# Patient Record
Sex: Female | Born: 1940
Health system: Southern US, Community
[De-identification: ages and names within clinical notes are randomized; demographics above are authoritative.]

## PROBLEM LIST (undated history)

## (undated) DIAGNOSIS — I471 Supraventricular tachycardia, unspecified: Secondary | ICD-10-CM

## (undated) DIAGNOSIS — D649 Anemia, unspecified: Secondary | ICD-10-CM

## (undated) DIAGNOSIS — Z803 Family history of malignant neoplasm of breast: Secondary | ICD-10-CM

## (undated) DIAGNOSIS — I4719 Other supraventricular tachycardia: Secondary | ICD-10-CM

## (undated) DIAGNOSIS — I341 Nonrheumatic mitral (valve) prolapse: Secondary | ICD-10-CM

## (undated) DIAGNOSIS — I00 Rheumatic fever without heart involvement: Secondary | ICD-10-CM

## (undated) DIAGNOSIS — E785 Hyperlipidemia, unspecified: Secondary | ICD-10-CM

## (undated) DIAGNOSIS — A498 Other bacterial infections of unspecified site: Secondary | ICD-10-CM

## (undated) HISTORY — DX: Family history of malignant neoplasm of breast: Z80.3

## (undated) HISTORY — PX: ABLATION: SHX5711

## (undated) HISTORY — DX: Hyperlipidemia, unspecified: E78.5

## (undated) HISTORY — DX: Nonrheumatic mitral (valve) prolapse: I34.1

## (undated) HISTORY — DX: Other supraventricular tachycardia: I47.19

## (undated) HISTORY — DX: Supraventricular tachycardia, unspecified: I47.10

## (undated) HISTORY — DX: Other bacterial infections of unspecified site: A49.8

## (undated) HISTORY — DX: Anemia, unspecified: D64.9

## (undated) HISTORY — DX: Supraventricular tachycardia: I47.1

## (undated) HISTORY — DX: Rheumatic fever without heart involvement: I00

---
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
## 1995-12-07 DIAGNOSIS — A498 Other bacterial infections of unspecified site: Secondary | ICD-10-CM

## 1995-12-07 HISTORY — PX: BREAST BIOPSY: SHX20

## 1995-12-07 HISTORY — DX: Other bacterial infections of unspecified site: A49.8

## 2002-10-26 ENCOUNTER — Ambulatory Visit (HOSPITAL_COMMUNITY): Admission: RE | Admit: 2002-10-26 | Discharge: 2002-10-26 | Payer: Self-pay | Admitting: *Deleted

## 2005-06-23 ENCOUNTER — Ambulatory Visit: Payer: Self-pay | Admitting: Internal Medicine

## 2006-02-08 ENCOUNTER — Ambulatory Visit: Payer: Self-pay | Admitting: Internal Medicine

## 2006-07-07 ENCOUNTER — Encounter: Payer: Self-pay | Admitting: Internal Medicine

## 2006-07-11 ENCOUNTER — Ambulatory Visit: Payer: Self-pay | Admitting: Internal Medicine

## 2006-09-28 ENCOUNTER — Ambulatory Visit: Payer: Self-pay | Admitting: Ophthalmology

## 2008-01-29 ENCOUNTER — Encounter: Payer: Self-pay | Admitting: Internal Medicine

## 2008-09-19 ENCOUNTER — Encounter: Payer: Self-pay | Admitting: Internal Medicine

## 2009-01-07 ENCOUNTER — Encounter: Payer: Self-pay | Admitting: Internal Medicine

## 2009-04-08 ENCOUNTER — Encounter: Payer: Self-pay | Admitting: Internal Medicine

## 2009-05-08 ENCOUNTER — Encounter: Payer: Self-pay | Admitting: Internal Medicine

## 2009-07-11 ENCOUNTER — Encounter: Payer: Self-pay | Admitting: Internal Medicine

## 2009-10-10 ENCOUNTER — Encounter: Payer: Self-pay | Admitting: Internal Medicine

## 2009-10-17 ENCOUNTER — Encounter: Payer: Self-pay | Admitting: Internal Medicine

## 2009-11-11 ENCOUNTER — Encounter: Payer: Self-pay | Admitting: Internal Medicine

## 2010-01-29 DIAGNOSIS — I471 Supraventricular tachycardia, unspecified: Secondary | ICD-10-CM | POA: Insufficient documentation

## 2010-01-30 ENCOUNTER — Ambulatory Visit: Payer: Self-pay | Admitting: Internal Medicine

## 2010-01-30 DIAGNOSIS — I471 Supraventricular tachycardia: Secondary | ICD-10-CM

## 2010-02-01 ENCOUNTER — Emergency Department: Payer: Self-pay | Admitting: Emergency Medicine

## 2010-12-27 ENCOUNTER — Encounter: Payer: Self-pay | Admitting: Interventional Cardiology

## 2011-01-05 NOTE — Consult Note (Signed)
Summary: [facility]   Imported By: Marylou Mccoy 03/20/2010 15:24:14  _____________________________________________________________________  External Attachment:    Type:   Image     Comment:   External Document

## 2011-01-05 NOTE — Consult Note (Signed)
Summary: [facility]  [facility]   Imported By: Marylou Mccoy 03/20/2010 15:05:42  _____________________________________________________________________  External Attachment:    Type:   Image     Comment:   External Document

## 2011-01-05 NOTE — Assessment & Plan Note (Signed)
Summary: nep/tachy cardia   Visit Type:  Initial Consult Primary Provider:  Dale [place], MD   History of Present Illness: The patient is referred by Dr. Katrinka Blazing for evaluation of SVT.  The patient is a pleasant 70 yo woman who was told back in 1968 that her heart was "different" and that her electrical activation of her heart was coming from its center but that this was normal.  She has had multiple evaluations since then by several physicians but had a monitor obtained under Dr. Katrinka Blazing because of increasing palpitations.  This demonstrated PJRT with clear termination with a PVC followed by near immediate re-initiation.  The patient has never had syncope and does not feel CHF symptoms.  Her LV function has been reported to be normal.  She denies peripheral edema.  No other complaints except that her husband has undergone a set back having been treated for metastatic salivary gland tumor and recently developed a Staph infection.    Current Medications (verified): 1)  Crestor 5 Mg Tabs (Rosuvastatin Calcium) .... Take One-Half Tablet By Mouth Daily. 2)  Metoprolol Succinate 25 Mg Xr24h-Tab (Metoprolol Succinate) .... Take One Tablet By Mouth Daily 3)  Multivitamins   Tabs (Multiple Vitamin) .... Take One Tablet By Mouth Once Daily. 4)  Calcium 600 Mg Tabs (Calcium) .... Take One Tablet By Mouth Once Daily. 5)  Vitamin D3 2000 Unit Caps (Cholecalciferol) .... Once Daily 6)  Avelox 400 Mg Tabs (Moxifloxacin Hcl) .... Once Daily  Allergies (verified): 1)  ! * Antihistamines 2)  ! Codeine 3)  ! Simvastatin  Past History:  Past Medical History: Last updated: 01/29/2010 Current Problems:  ATRIAL TACHYCARDIA (ICD-427.89)    Family History: Last updated: 01/30/2010 Mother lived to 7.  Social History: Last updated: 01/30/2010 Married No ETOH or tobacco  Past Surgical History: Breast biopsy 1997  Family History: Mother lived to 19.  Social History: Married No ETOH or  tobacco  Review of Systems       All systems reviewed and negative except as noted in the HPI.  Vital Signs:  Patient profile:   70 year old female Height:      64 inches Weight:      114 pounds BMI:     19.64 Pulse rate:   108 / minute BP sitting:   100 / 64  (left arm)  Vitals Entered By: Laurance Flatten CMA (January 30, 2010 11:44 AM)  Physical Exam  General:  Well developed, well nourished, in no acute distress.  HEENT: normal Neck: supple. No JVD. Carotids 2+ bilaterally no bruits Cor: Regular tachycardia, with  no rubs, gallops or murmur Lungs: CTA Ab: soft, nontender. nondistended. No HSM. Good bowel sounds Ext: warm. no cyanosis, clubbing or edema Neuro: alert and oriented. Grossly nonfocal. affect pleasant    EKG  Procedure date:  01/30/2010  Findings:      PJRT at 110/min.  Impression & Recommendations:  Problem # 1:  PSVT (ICD-427.0) The patient appears to have PJRT (Permanent form of junctional reciprocating tachycardia) and it sounds like she has had this for many years.  Something has changed and she appears to be more bothered by it than previously.  I spent one hour today discussing the problem and its treatment.  The risks/benefits/goals/expectations of ablation therapy have been discussed with the patient and she is considering her treatment options.  She will continue her current meds and call if she would like to schedule an ablation. Her updated medication list for this  problem includes:    Metoprolol Succinate 25 Mg Xr24h-tab (Metoprolol succinate) .Marland Kitchen... Take one tablet by mouth daily

## 2011-01-05 NOTE — Consult Note (Signed)
Summary: [facility]   Imported By: Marylou Mccoy 03/20/2010 15:21:36  _____________________________________________________________________  External Attachment:    Type:   Image     Comment:   External Document

## 2011-01-05 NOTE — Consult Note (Signed)
Summary: Jody Howard   Imported By: Marylou Mccoy 12/16/2009 13:12:34  _____________________________________________________________________  External Attachment:    Type:   Image     Comment:   External Document

## 2011-01-05 NOTE — Consult Note (Signed)
Summary: [facility]   Imported By: Marylou Mccoy 03/20/2010 15:23:01  _____________________________________________________________________  External Attachment:    Type:   Image     Comment:   External Document

## 2011-01-08 NOTE — Consult Note (Signed)
Summary: [facility]   Imported By: Marylou Mccoy 03/20/2010 16:08:36  _____________________________________________________________________  External Attachment:    Type:   Image     Comment:   External Document

## 2011-04-08 LAB — HEPATIC FUNCTION PANEL
ALT: 14 U/L (ref 7–35)
AST: 23 U/L (ref 13–35)

## 2011-04-08 LAB — CBC AND DIFFERENTIAL
HCT: 39 % (ref 36–46)
Hemoglobin: 13 g/dL (ref 12.0–16.0)

## 2011-06-03 ENCOUNTER — Ambulatory Visit: Payer: Self-pay | Admitting: Internal Medicine

## 2011-08-03 LAB — LIPID PANEL
Cholesterol: 155 mg/dL (ref 0–200)
HDL: 52 mg/dL (ref 35–70)

## 2012-01-06 DIAGNOSIS — F29 Unspecified psychosis not due to a substance or known physiological condition: Secondary | ICD-10-CM | POA: Diagnosis not present

## 2012-01-07 DIAGNOSIS — F22 Delusional disorders: Secondary | ICD-10-CM | POA: Diagnosis not present

## 2012-01-07 DIAGNOSIS — I499 Cardiac arrhythmia, unspecified: Secondary | ICD-10-CM | POA: Diagnosis not present

## 2012-01-07 DIAGNOSIS — F29 Unspecified psychosis not due to a substance or known physiological condition: Secondary | ICD-10-CM | POA: Diagnosis not present

## 2012-01-07 DIAGNOSIS — E785 Hyperlipidemia, unspecified: Secondary | ICD-10-CM | POA: Diagnosis not present

## 2012-01-07 DIAGNOSIS — I471 Supraventricular tachycardia: Secondary | ICD-10-CM | POA: Diagnosis not present

## 2012-05-10 DIAGNOSIS — D239 Other benign neoplasm of skin, unspecified: Secondary | ICD-10-CM | POA: Diagnosis not present

## 2012-05-10 DIAGNOSIS — L821 Other seborrheic keratosis: Secondary | ICD-10-CM | POA: Diagnosis not present

## 2012-05-10 DIAGNOSIS — D485 Neoplasm of uncertain behavior of skin: Secondary | ICD-10-CM | POA: Diagnosis not present

## 2012-05-10 DIAGNOSIS — L819 Disorder of pigmentation, unspecified: Secondary | ICD-10-CM | POA: Diagnosis not present

## 2012-08-13 DIAGNOSIS — Z23 Encounter for immunization: Secondary | ICD-10-CM | POA: Diagnosis not present

## 2012-10-24 ENCOUNTER — Telehealth: Payer: Self-pay | Admitting: Internal Medicine

## 2012-10-24 DIAGNOSIS — Z139 Encounter for screening, unspecified: Secondary | ICD-10-CM

## 2012-10-24 NOTE — Telephone Encounter (Signed)
I placed order for mammo.

## 2012-10-24 NOTE — Telephone Encounter (Signed)
Pt called stating it is time for her mammogram and she needs order Pt perfers dr Rennis Petty mammogram  [address] in [place]

## 2012-11-15 DIAGNOSIS — Z1231 Encounter for screening mammogram for malignant neoplasm of breast: Secondary | ICD-10-CM | POA: Diagnosis not present

## 2012-11-24 DIAGNOSIS — H6120 Impacted cerumen, unspecified ear: Secondary | ICD-10-CM | POA: Diagnosis not present

## 2012-11-24 DIAGNOSIS — M542 Cervicalgia: Secondary | ICD-10-CM | POA: Diagnosis not present

## 2012-11-24 DIAGNOSIS — H60509 Unspecified acute noninfective otitis externa, unspecified ear: Secondary | ICD-10-CM | POA: Diagnosis not present

## 2012-12-08 DIAGNOSIS — L821 Other seborrheic keratosis: Secondary | ICD-10-CM | POA: Diagnosis not present

## 2012-12-08 DIAGNOSIS — D1739 Benign lipomatous neoplasm of skin and subcutaneous tissue of other sites: Secondary | ICD-10-CM | POA: Diagnosis not present

## 2012-12-08 DIAGNOSIS — D1801 Hemangioma of skin and subcutaneous tissue: Secondary | ICD-10-CM | POA: Diagnosis not present

## 2012-12-08 DIAGNOSIS — D239 Other benign neoplasm of skin, unspecified: Secondary | ICD-10-CM | POA: Diagnosis not present

## 2012-12-08 DIAGNOSIS — D485 Neoplasm of uncertain behavior of skin: Secondary | ICD-10-CM | POA: Diagnosis not present

## 2013-01-25 ENCOUNTER — Encounter: Payer: Self-pay | Admitting: *Deleted

## 2013-01-29 ENCOUNTER — Encounter: Payer: Self-pay | Admitting: Internal Medicine

## 2013-01-29 ENCOUNTER — Ambulatory Visit (INDEPENDENT_AMBULATORY_CARE_PROVIDER_SITE_OTHER): Payer: Medicare Other | Admitting: Internal Medicine

## 2013-01-29 VITALS — BP 110/58 | HR 64 | Temp 98.0°F | Ht 64.0 in | Wt 112.8 lb

## 2013-01-29 DIAGNOSIS — E7800 Pure hypercholesterolemia, unspecified: Secondary | ICD-10-CM | POA: Diagnosis not present

## 2013-01-29 DIAGNOSIS — J479 Bronchiectasis, uncomplicated: Secondary | ICD-10-CM

## 2013-01-29 DIAGNOSIS — I498 Other specified cardiac arrhythmias: Secondary | ICD-10-CM

## 2013-01-31 ENCOUNTER — Other Ambulatory Visit (INDEPENDENT_AMBULATORY_CARE_PROVIDER_SITE_OTHER): Payer: Medicare Other

## 2013-01-31 ENCOUNTER — Encounter: Payer: Self-pay | Admitting: *Deleted

## 2013-01-31 DIAGNOSIS — E7800 Pure hypercholesterolemia, unspecified: Secondary | ICD-10-CM

## 2013-01-31 LAB — CBC WITH DIFFERENTIAL/PLATELET
Basophils Relative: 0.3 % (ref 0.0–3.0)
Eosinophils Absolute: 0.2 10*3/uL (ref 0.0–0.7)
Eosinophils Relative: 5.1 % — ABNORMAL HIGH (ref 0.0–5.0)
HCT: 36.8 % (ref 36.0–46.0)
Hemoglobin: 12.4 g/dL (ref 12.0–15.0)
Lymphs Abs: 1.1 10*3/uL (ref 0.7–4.0)
MCHC: 33.7 g/dL (ref 30.0–36.0)
MCV: 91.8 fl (ref 78.0–100.0)
Monocytes Absolute: 0.4 10*3/uL (ref 0.1–1.0)
Neutro Abs: 2.8 10*3/uL (ref 1.4–7.7)
Neutrophils Relative %: 61.1 % (ref 43.0–77.0)
RBC: 4.02 Mil/uL (ref 3.87–5.11)

## 2013-01-31 LAB — TSH: TSH: 1.06 u[IU]/mL (ref 0.35–5.50)

## 2013-01-31 LAB — LIPID PANEL
Cholesterol: 174 mg/dL (ref 0–200)
LDL Cholesterol: 100 mg/dL — ABNORMAL HIGH (ref 0–99)
Triglycerides: 61 mg/dL (ref 0.0–149.0)
VLDL: 12.2 mg/dL (ref 0.0–40.0)

## 2013-01-31 LAB — COMPREHENSIVE METABOLIC PANEL
AST: 22 U/L (ref 0–37)
Alkaline Phosphatase: 49 U/L (ref 39–117)
BUN: 18 mg/dL (ref 6–23)
Creatinine, Ser: 0.9 mg/dL (ref 0.4–1.2)
Glucose, Bld: 78 mg/dL (ref 70–99)
Total Bilirubin: 0.7 mg/dL (ref 0.3–1.2)

## 2013-02-02 ENCOUNTER — Encounter: Payer: Self-pay | Admitting: Internal Medicine

## 2013-02-02 ENCOUNTER — Other Ambulatory Visit: Payer: Medicare Other

## 2013-02-11 ENCOUNTER — Encounter: Payer: Self-pay | Admitting: Internal Medicine

## 2013-02-11 DIAGNOSIS — E7800 Pure hypercholesterolemia, unspecified: Secondary | ICD-10-CM | POA: Insufficient documentation

## 2013-02-11 DIAGNOSIS — J479 Bronchiectasis, uncomplicated: Secondary | ICD-10-CM | POA: Insufficient documentation

## 2013-02-11 NOTE — Assessment & Plan Note (Signed)
S/p ablation and has done well.  Asymptomatic.  Follow.  

## 2013-02-11 NOTE — Assessment & Plan Note (Signed)
On crestor.  Check lipid panel and liver function.  

## 2013-02-11 NOTE — Progress Notes (Signed)
  Subjective:    Patient ID: Jody Howard, female    DOB: May 15, 1941, 72 y.o.   MRN: [id number]  HPI 72 year old female with past history of MVP with moderate MR and TR, atrial tachycardia s/p ablation and hypercholesterolemia who comes in today for a scheduled follow up and to transfer her care to [facility].  She states she is doing well.  No increased heart rate or palpitations.  No chest pain or tightness.  Stays active.  No increased sob.  Breathing stable.  No nausea or vomiting.  Weight stable. Bowels stable.     Past Medical History  Diagnosis Date  . Mitral valve prolapse   . Rheumatic fever   . Clostridium difficile infection 1997    required hospitalization  . Anemia   . Hyperlipidemia   . Atrial tachycardia     s/p ablation    Current Outpatient Prescriptions on File Prior to Visit  Medication Sig Dispense Refill  . alendronate (FOSAMAX) 70 MG tablet Take 70 mg by mouth every 7 (seven) days. Take with a full glass of water on an empty stomach.      . Calcium Carbonate-Vit D-Min (CALCIUM 600 + MINERALS PO) Take by mouth.      . Cholecalciferol (VITAMIN D-3 PO) Take by mouth.      . Multiple Vitamins-Minerals (MULTIVITAMIN PO) Take by mouth.      . rosuvastatin (CRESTOR) 5 MG tablet Take 5 mg by mouth daily. Take 1/2 tab daily       No current facility-administered medications on file prior to visit.    Review of Systems Patient denies any headache, lightheadedness or dizziness.  No sinus or allergy symptoms.  No chest pain, tightness or palpitations.  No increased shortness of breath, cough or congestion.  No nausea or vomiting.  No acid reflux.  No abdominal pain or cramping.  No bowel change, such as diarrhea, constipation, BRBPR or melana.  No urine change.        Objective:   Physical Exam Filed Vitals:   01/29/13 1546  BP: 110/58  Pulse: 64  Temp: 98 F (74.66 C)   72 year old female in no acute distress.   HEENT:  Nares- clear.  Oropharynx - without  lesions. NECK:  Supple.  Nontender.  No audible bruit.  HEART:  Appears to be regular. LUNGS:  No crackles or wheezing audible.  Respirations even and unlabored.  RADIAL PULSE:  Equal bilaterally.  ABDOMEN:  Soft, nontender.  Bowel sounds present and normal.  No audible abdominal bruit.    EXTREMITIES:  No increased edema present.  DP pulses palpable and equal bilaterally.          Assessment & Plan:  PREVIOUS WEIGHT LOSS.  Weight stable.  Eating well.    HEALTH MAINTENANCE.  Physical 06/04/11.  Colonoscopy 01/10/09 normal.  Sees Dr Ray Church.  Mammogram 11/15/12 - BiRADS I.

## 2013-02-11 NOTE — Assessment & Plan Note (Signed)
Mild bronchiectasis.  Sees Dr Kussin.  Currently doing well.  Follow.   

## 2013-03-15 ENCOUNTER — Encounter: Payer: Self-pay | Admitting: Internal Medicine

## 2013-04-18 ENCOUNTER — Encounter: Payer: Self-pay | Admitting: Internal Medicine

## 2013-05-21 ENCOUNTER — Encounter: Payer: Medicare Other | Admitting: Internal Medicine

## 2013-06-05 ENCOUNTER — Ambulatory Visit (INDEPENDENT_AMBULATORY_CARE_PROVIDER_SITE_OTHER): Payer: Medicare Other | Admitting: Internal Medicine

## 2013-06-05 ENCOUNTER — Other Ambulatory Visit (HOSPITAL_COMMUNITY)
Admission: RE | Admit: 2013-06-05 | Discharge: 2013-06-05 | Disposition: A | Discharge status: Home / Self Care | Payer: Medicare Other | Source: Ambulatory Visit | Attending: Internal Medicine | Admitting: Internal Medicine

## 2013-06-05 ENCOUNTER — Encounter: Payer: Self-pay | Admitting: Internal Medicine

## 2013-06-05 VITALS — BP 110/60 | HR 62 | Temp 98.2°F | Ht 64.0 in | Wt 118.0 lb

## 2013-06-05 DIAGNOSIS — M79609 Pain in unspecified limb: Secondary | ICD-10-CM | POA: Diagnosis not present

## 2013-06-05 DIAGNOSIS — N8110 Cystocele, unspecified: Secondary | ICD-10-CM

## 2013-06-05 DIAGNOSIS — M810 Age-related osteoporosis without current pathological fracture: Secondary | ICD-10-CM

## 2013-06-05 DIAGNOSIS — Z1151 Encounter for screening for human papillomavirus (HPV): Secondary | ICD-10-CM | POA: Diagnosis not present

## 2013-06-05 DIAGNOSIS — Z124 Encounter for screening for malignant neoplasm of cervix: Secondary | ICD-10-CM

## 2013-06-05 DIAGNOSIS — M79672 Pain in left foot: Secondary | ICD-10-CM

## 2013-06-05 DIAGNOSIS — Z01419 Encounter for gynecological examination (general) (routine) without abnormal findings: Secondary | ICD-10-CM | POA: Diagnosis not present

## 2013-06-05 DIAGNOSIS — E7800 Pure hypercholesterolemia, unspecified: Secondary | ICD-10-CM | POA: Diagnosis not present

## 2013-06-05 DIAGNOSIS — N814 Uterovaginal prolapse, unspecified: Secondary | ICD-10-CM

## 2013-06-05 DIAGNOSIS — Z1211 Encounter for screening for malignant neoplasm of colon: Secondary | ICD-10-CM

## 2013-06-05 DIAGNOSIS — I498 Other specified cardiac arrhythmias: Secondary | ICD-10-CM | POA: Diagnosis not present

## 2013-06-05 DIAGNOSIS — J479 Bronchiectasis, uncomplicated: Secondary | ICD-10-CM

## 2013-06-06 ENCOUNTER — Encounter: Payer: Self-pay | Admitting: Internal Medicine

## 2013-06-06 DIAGNOSIS — Q742 Other congenital malformations of lower limb(s), including pelvic girdle: Secondary | ICD-10-CM | POA: Diagnosis not present

## 2013-06-06 DIAGNOSIS — N8110 Cystocele, unspecified: Secondary | ICD-10-CM | POA: Insufficient documentation

## 2013-06-06 DIAGNOSIS — M659 Synovitis and tenosynovitis, unspecified: Secondary | ICD-10-CM | POA: Diagnosis not present

## 2013-06-06 DIAGNOSIS — Q6689 Other  specified congenital deformities of feet: Secondary | ICD-10-CM | POA: Diagnosis not present

## 2013-06-06 NOTE — Assessment & Plan Note (Signed)
S/p ablation and has done well.  Asymptomatic.  Follow.  

## 2013-06-06 NOTE — Assessment & Plan Note (Signed)
Mild bronchiectasis.  Sees Dr Kussin.  Currently doing well.  Follow.   

## 2013-06-06 NOTE — Assessment & Plan Note (Signed)
Exam as outlined.  Will refer to gyn for evaluation with question of benefit of pessary.  Discussed Kaegel exercises.

## 2013-06-06 NOTE — Assessment & Plan Note (Signed)
On crestor.  Check lipid panel and liver function.  

## 2013-06-06 NOTE — Progress Notes (Signed)
Subjective:    Patient ID: Jody Howard, female    DOB: 03-16-1941, 72 y.o.   MRN: [id number]  HPI 72 year old female with past history of MVP with moderate MR and TR, atrial tachycardia s/p ablation and hypercholesterolemia who comes in today to follow up on these issues as well as for a complete physical exam.  She states she is doing well.  No increased heart rate or palpitations.  No chest pain or tightness.  Stays active.  No increased sob.  Breathing stable.  No nausea or vomiting.  Weight stable (actually up a few pounds).  Bowels stable.  She is having persistent pain in her left foot.  She injured her foot/toe a few months ago.  Persistent pain and request referral to podiatry.  She also reports having problems with probable prolapse.  Increasing.      Past Medical History  Diagnosis Date  . Mitral valve prolapse   . Rheumatic fever   . Clostridium difficile infection 1997    required hospitalization  . Anemia   . Hyperlipidemia   . Atrial tachycardia     s/p ablation    Current Outpatient Prescriptions on File Prior to Visit  Medication Sig Dispense Refill  . alendronate (FOSAMAX) 70 MG tablet Take 70 mg by mouth every 7 (seven) days. Take with a full glass of water on an empty stomach.      . Cholecalciferol (VITAMIN D-3 PO) Take by mouth.      . Multiple Vitamins-Minerals (MULTIVITAMIN PO) Take by mouth.      . rosuvastatin (CRESTOR) 5 MG tablet Take 5 mg by mouth daily. Take 1/2 tab daily       No current facility-administered medications on file prior to visit.    Review of Systems Patient denies any headache, lightheadedness or dizziness.  No sinus or allergy symptoms.  No chest pain, tightness or palpitations.  No increased shortness of breath, cough or congestion.  No nausea or vomiting.  No acid reflux.  No abdominal pain or cramping.  No bowel change, such as diarrhea, constipation, BRBPR or melana.  No urine change.  She does report the prolapse as outlined.  Feels  worsening.  Also reports the increased pain - left foot /toe.       Objective:   Physical Exam  Filed Vitals:   06/05/13 1107  BP: 110/60  Pulse: 62  Temp: 98.2 F (76.37 C)   72 year old female in no acute distress.   HEENT:  Nares- clear.  Oropharynx - without lesions. NECK:  Supple.  Nontender.  No audible bruit.  HEART:  Appears to be regular. LUNGS:  No crackles or wheezing audible.  Respirations even and unlabored.  RADIAL PULSE:  Equal bilaterally.    BREASTS:  No nipple discharge or nipple retraction present.  Could not appreciate any distinct nodules or axillary adenopathy.  ABDOMEN:  Soft, nontender.  Bowel sounds present and normal.  No audible abdominal bruit.  GU:  Normal external genitalia.  Vaginal vault without lesions.  Cervix identified.  Pap performed. Some vaginal prolapse present.  Could not appreciate any adnexal masses or tenderness.   RECTAL:  Heme negative.   EXTREMITIES:  No increased edema present.  DP pulses palpable and equal bilaterally.  Pain left foot/base of toe.  No increased redness or swelling.           Assessment & Plan:  FOOT PAIN.  S/p injury.   Persistent.  Request referral to podiatry.  Will refer to podiatry for further evaluation and treatment.  Will hold on xray.    PREVIOUS WEIGHT LOSS.  Weight stable.  Eating well.  Weight is up some from last check.  Follow.  HEALTH MAINTENANCE.  Physical today.  Colonoscopy 01/10/09 normal.  Sees Dr Ray Church.  Mammogram 11/15/12 - BiRADS I.    I spent 45 minutes with this pt and more than 50% of the time was spent in consultation regarding the above.

## 2013-06-11 ENCOUNTER — Other Ambulatory Visit (INDEPENDENT_AMBULATORY_CARE_PROVIDER_SITE_OTHER): Payer: Medicare Other

## 2013-06-11 ENCOUNTER — Encounter: Payer: Self-pay | Admitting: *Deleted

## 2013-06-11 DIAGNOSIS — Z1211 Encounter for screening for malignant neoplasm of colon: Secondary | ICD-10-CM

## 2013-06-11 LAB — FECAL OCCULT BLOOD, IMMUNOCHEMICAL: Fecal Occult Bld: NEGATIVE

## 2013-06-12 ENCOUNTER — Encounter: Payer: Self-pay | Admitting: *Deleted

## 2013-06-13 ENCOUNTER — Telehealth: Payer: Self-pay | Admitting: Internal Medicine

## 2013-06-13 NOTE — Telephone Encounter (Signed)
Pt is needing something for a bad bladder infection she was in last week. She is in a lot of pain. Pt uses [facility] or [facility] on [address].

## 2013-06-13 NOTE — Telephone Encounter (Signed)
Pt notified of need for OV to evaluate. Appt scheduled 06/14/13 with Raquel. Advised to be seen at Urgent Care if unable to wait until appointment in morning.

## 2013-06-13 NOTE — Telephone Encounter (Signed)
With increased pain - needs eval

## 2013-06-14 ENCOUNTER — Ambulatory Visit (INDEPENDENT_AMBULATORY_CARE_PROVIDER_SITE_OTHER): Payer: Medicare Other | Admitting: Adult Health

## 2013-06-14 ENCOUNTER — Encounter: Payer: Self-pay | Admitting: Adult Health

## 2013-06-14 VITALS — BP 110/62 | HR 58 | Temp 97.8°F | Resp 12

## 2013-06-14 DIAGNOSIS — N390 Urinary tract infection, site not specified: Secondary | ICD-10-CM

## 2013-06-14 DIAGNOSIS — R300 Dysuria: Secondary | ICD-10-CM | POA: Diagnosis not present

## 2013-06-14 LAB — POCT URINALYSIS DIPSTICK
Glucose, UA: NEGATIVE
Nitrite, UA: NEGATIVE
Protein, UA: 30
Spec Grav, UA: 1.025
Urobilinogen, UA: 0.2

## 2013-06-14 MED ORDER — SULFAMETHOXAZOLE-TMP DS 800-160 MG PO TABS: 1.0000 | ORAL_TABLET | Freq: Two times a day (BID) | ORAL | Status: DC

## 2013-06-14 NOTE — Patient Instructions (Addendum)
Urinary Tract Infection  Urinary tract infections (UTIs) can develop anywhere along your urinary tract. Your urinary tract is your body's drainage system for removing wastes and extra water. Your urinary tract includes two kidneys, two ureters, a bladder, and a urethra. Your kidneys are a pair of bean-shaped organs. Each kidney is about the size of your fist. They are located below your ribs, one on each side of your spine.  CAUSES  Infections are caused by microbes, which are microscopic organisms, including fungi, viruses, and bacteria. These organisms are so small that they can only be seen through a microscope. Bacteria are the microbes that most commonly cause UTIs.  SYMPTOMS   Symptoms of UTIs may vary by age and gender of the patient and by the location of the infection. Symptoms in young women typically include a frequent and intense urge to urinate and a painful, burning feeling in the bladder or urethra during urination. Older women and men are more likely to be tired, shaky, and weak and have muscle aches and abdominal pain. A fever may mean the infection is in your kidneys. Other symptoms of a kidney infection include pain in your back or sides below the ribs, nausea, and vomiting.  DIAGNOSIS  To diagnose a UTI, your caregiver will ask you about your symptoms. Your caregiver also will ask to provide a urine sample. The urine sample will be tested for bacteria and white blood cells. White blood cells are made by your body to help fight infection.  TREATMENT   Typically, UTIs can be treated with medication. Because most UTIs are caused by a bacterial infection, they usually can be treated with the use of antibiotics. The choice of antibiotic and length of treatment depend on your symptoms and the type of bacteria causing your infection.  HOME CARE INSTRUCTIONS   If you were prescribed antibiotics, take them exactly as your caregiver instructs you. Finish the medication even if you feel better after you  have only taken some of the medication.   Drink enough water and fluids to keep your urine clear or pale yellow.   Avoid caffeine, tea, and carbonated beverages. They tend to irritate your bladder.   Empty your bladder often. Avoid holding urine for long periods of time.   Empty your bladder before and after sexual intercourse.   After a bowel movement, women should cleanse from front to back. Use each tissue only once.  SEEK MEDICAL CARE IF:    You have back pain.   You develop a fever.   Your symptoms do not begin to resolve within 3 days.  SEEK IMMEDIATE MEDICAL CARE IF:    You have severe back pain or lower abdominal pain.   You develop chills.   You have nausea or vomiting.   You have continued burning or discomfort with urination.  MAKE SURE YOU:    Understand these instructions.   Will watch your condition.   Will get help right away if you are not doing well or get worse.  Document Released: 09/01/2005 Document Revised: 05/23/2012 Document Reviewed: 12/31/2011  [employer] Patient Information 2014 [employer].

## 2013-06-14 NOTE — Assessment & Plan Note (Signed)
UA dipstick shows small amount of leukocytes and trace blood. Send urine for culture. Start Bactrim DS twice a day x3 days. RTC if symptoms are not improved within 2-3 days.

## 2013-06-14 NOTE — Progress Notes (Signed)
  Subjective:    Patient ID: Jody Howard, female    DOB: [birth year], 72 y.o.   MRN: [id number]  HPI  Pt presents with dysuria and sudden episode of incontinence that began yesterday. Patient denies fever, hematuria.  Current Outpatient Prescriptions on File Prior to Visit  Medication Sig Dispense Refill  . alendronate (FOSAMAX) 70 MG tablet Take 70 mg by mouth every 7 (seven) days. Take with a full glass of water on an empty stomach.      . Cholecalciferol (VITAMIN D-3 PO) Take by mouth.      . Multiple Vitamins-Minerals (MULTIVITAMIN PO) Take by mouth.      . rosuvastatin (CRESTOR) 5 MG tablet Take 5 mg by mouth daily. Take 1/2 tab daily       No current facility-administered medications on file prior to visit.     Review of Systems  Genitourinary: Positive for dysuria and frequency. Negative for hematuria and flank pain.       Objective:   Physical Exam  Constitutional: She is oriented to person, place, and time. She appears well-developed and well-nourished. No distress.  Cardiovascular: Normal rate and regular rhythm.   Pulmonary/Chest: Effort normal. No respiratory distress.  Genitourinary:  Suprapubic discomfort  Neurological: She is alert and oriented to person, place, and time.  Psychiatric: She has a normal mood and affect. Her behavior is normal. Judgment and thought content normal.          Assessment & Plan:

## 2013-06-19 ENCOUNTER — Encounter: Payer: Self-pay | Admitting: Internal Medicine

## 2013-08-10 ENCOUNTER — Other Ambulatory Visit (INDEPENDENT_AMBULATORY_CARE_PROVIDER_SITE_OTHER): Payer: Medicare Other

## 2013-08-10 DIAGNOSIS — E7800 Pure hypercholesterolemia, unspecified: Secondary | ICD-10-CM | POA: Diagnosis not present

## 2013-08-10 DIAGNOSIS — M810 Age-related osteoporosis without current pathological fracture: Secondary | ICD-10-CM

## 2013-08-10 LAB — HEPATIC FUNCTION PANEL
ALT: 11 U/L (ref 0–35)
Bilirubin, Direct: 0 mg/dL (ref 0.0–0.3)
Total Protein: 6.7 g/dL (ref 6.0–8.3)

## 2013-08-10 LAB — LIPID PANEL
Cholesterol: 157 mg/dL (ref 0–200)
HDL: 56.8 mg/dL (ref >39.00)
LDL Cholesterol: 90 mg/dL (ref 0–99)
VLDL: 10.6 mg/dL (ref 0.0–40.0)

## 2013-08-10 NOTE — Progress Notes (Signed)
Awaiting results on Vitamin D before notifying patient

## 2013-08-11 LAB — VITAMIN D 25 HYDROXY (VIT D DEFICIENCY, FRACTURES): Vit D, 25-Hydroxy: 65 ng/mL (ref 30–89)

## 2013-08-13 ENCOUNTER — Encounter: Payer: Self-pay | Admitting: *Deleted

## 2013-08-30 DIAGNOSIS — Z23 Encounter for immunization: Secondary | ICD-10-CM | POA: Diagnosis not present

## 2013-11-07 ENCOUNTER — Telehealth: Payer: Self-pay | Admitting: *Deleted

## 2013-11-07 NOTE — Telephone Encounter (Signed)
Pt called requesting a refill on the Bactrim for a current UTI. She is not able to come in today because she is helping someone move & she was planning on visiting her sister today. Saw Raquel in July for UTI and was treated with Bactrim at that time. She only has 3 pills left. Please advise

## 2013-11-07 NOTE — Telephone Encounter (Signed)
Unable to call in abx.  If she is having these symptoms - she needs to be evaluated to confirm infection and to confirm abx sensitive to.

## 2013-11-07 NOTE — Telephone Encounter (Signed)
Left message for pt to return my call.

## 2013-11-07 NOTE — Telephone Encounter (Signed)
Pt coming in to see Raquel tomorrow (12/4) @ 8:30

## 2013-11-08 ENCOUNTER — Telehealth: Payer: Self-pay | Admitting: Internal Medicine

## 2013-11-08 ENCOUNTER — Ambulatory Visit (INDEPENDENT_AMBULATORY_CARE_PROVIDER_SITE_OTHER): Payer: Medicare Other | Admitting: Adult Health

## 2013-11-08 ENCOUNTER — Encounter: Payer: Self-pay | Admitting: Adult Health

## 2013-11-08 ENCOUNTER — Encounter (INDEPENDENT_AMBULATORY_CARE_PROVIDER_SITE_OTHER): Payer: Self-pay

## 2013-11-08 ENCOUNTER — Other Ambulatory Visit: Payer: Self-pay | Admitting: *Deleted

## 2013-11-08 VITALS — BP 116/70 | HR 64 | Temp 98.4°F | Resp 14 | Wt 113.0 lb

## 2013-11-08 DIAGNOSIS — N390 Urinary tract infection, site not specified: Secondary | ICD-10-CM

## 2013-11-08 DIAGNOSIS — R300 Dysuria: Secondary | ICD-10-CM | POA: Diagnosis not present

## 2013-11-08 DIAGNOSIS — N8111 Cystocele, midline: Secondary | ICD-10-CM

## 2013-11-08 DIAGNOSIS — N8110 Cystocele, unspecified: Secondary | ICD-10-CM

## 2013-11-08 LAB — POCT URINALYSIS DIPSTICK
Glucose, UA: NEGATIVE
Nitrite, UA: NEGATIVE
Protein, UA: NEGATIVE
Urobilinogen, UA: 0.2

## 2013-11-08 MED ORDER — SULFAMETHOXAZOLE-TMP DS 800-160 MG PO TABS: 1.0000 | ORAL_TABLET | Freq: Two times a day (BID) | ORAL | Status: DC

## 2013-11-08 MED ORDER — ROSUVASTATIN CALCIUM 5 MG PO TABS: 2.5000 mg | ORAL_TABLET | Freq: Every day | ORAL | Status: DC

## 2013-11-08 NOTE — Progress Notes (Signed)
   Subjective:    Patient ID: Jody Howard, female    DOB: October 01, 1941, 72 y.o.   MRN: [id number]  HPI Patient is a pleasant 72 year old female who presents to clinic with dysuria, frequency and urgency. She reports having a history of bladder prolapse which occurred years back while helping care for her husband. She was recently helping her niece move and began to experience symptoms. She has not seen a specialist for her bladder problem as she was hoping that this would resolve on its own. As far as her urinary symptoms, patient had left over antibiotics which she has taken a total of 3 pills. The antibiotic was Cefexime.  Current Outpatient Prescriptions on File Prior to Visit  Medication Sig Dispense Refill  . Cholecalciferol (VITAMIN D-3 PO) Take by mouth.      . Multiple Vitamins-Minerals (MULTIVITAMIN PO) Take by mouth.      . rosuvastatin (CRESTOR) 5 MG tablet Take 5 mg by mouth daily. Take 1/2 tab daily      . alendronate (FOSAMAX) 70 MG tablet Take 70 mg by mouth every 7 (seven) days. Take with a full glass of water on an empty stomach.       No current facility-administered medications on file prior to visit.      Review of Systems  Constitutional: Negative for fever and chills.  Genitourinary: Positive for dysuria, urgency and frequency. Negative for hematuria and flank pain.       Objective:   Physical Exam  Constitutional: She is oriented to person, place, and time. She appears well-developed and well-nourished. No distress.  Genitourinary:  Pressure suprapubic area.  Neurological: She is alert and oriented to person, place, and time.  Psychiatric: She has a normal mood and affect. Judgment and thought content normal.    BP 116/70  Pulse 64  Temp(Src) 98.4 F (36.9 C) (Oral)  Resp 14  Wt 113 lb (51.256 kg)  SpO2 98%  LMP 01/29/1983       Assessment & Plan:

## 2013-11-08 NOTE — Assessment & Plan Note (Signed)
Start Bactrim twice a day x14 days. Recommend seeing a uro-gynecologist. Referral made to [facility].

## 2013-11-08 NOTE — Telephone Encounter (Signed)
Pt forgot to ask for refill of her Crestor when she was seen this morning by R. Rey.  She has recently switched to [employer]. [employer].

## 2013-11-08 NOTE — Progress Notes (Signed)
Pre visit review using our clinic review tool, if applicable. No additional management support is needed unless otherwise documented below in the visit note. 

## 2013-11-08 NOTE — Telephone Encounter (Signed)
Refilled RX until appt in January

## 2013-11-09 LAB — URINE CULTURE
Colony Count: NO GROWTH
Organism ID, Bacteria: NO GROWTH

## 2013-11-15 ENCOUNTER — Telehealth: Payer: Self-pay | Admitting: Internal Medicine

## 2013-11-15 NOTE — Telephone Encounter (Signed)
The patient is returning your call.

## 2013-11-15 NOTE — Telephone Encounter (Signed)
Pt advised and states an understanding 

## 2013-11-23 DIAGNOSIS — N812 Incomplete uterovaginal prolapse: Secondary | ICD-10-CM | POA: Diagnosis not present

## 2013-11-23 DIAGNOSIS — IMO0002 Reserved for concepts with insufficient information to code with codable children: Secondary | ICD-10-CM | POA: Diagnosis not present

## 2013-11-23 DIAGNOSIS — Z639 Problem related to primary support group, unspecified: Secondary | ICD-10-CM | POA: Diagnosis not present

## 2013-12-11 ENCOUNTER — Encounter: Payer: Self-pay | Admitting: Internal Medicine

## 2013-12-11 ENCOUNTER — Other Ambulatory Visit: Payer: Self-pay | Admitting: Internal Medicine

## 2013-12-11 ENCOUNTER — Ambulatory Visit (INDEPENDENT_AMBULATORY_CARE_PROVIDER_SITE_OTHER): Payer: Medicare Other | Admitting: Internal Medicine

## 2013-12-11 VITALS — BP 100/58 | HR 59 | Temp 97.9°F | Ht 64.0 in | Wt 114.0 lb

## 2013-12-11 DIAGNOSIS — I498 Other specified cardiac arrhythmias: Secondary | ICD-10-CM | POA: Diagnosis not present

## 2013-12-11 DIAGNOSIS — E7800 Pure hypercholesterolemia, unspecified: Secondary | ICD-10-CM

## 2013-12-11 DIAGNOSIS — I471 Supraventricular tachycardia: Secondary | ICD-10-CM | POA: Diagnosis not present

## 2013-12-11 DIAGNOSIS — J479 Bronchiectasis, uncomplicated: Secondary | ICD-10-CM

## 2013-12-11 DIAGNOSIS — N8110 Cystocele, unspecified: Secondary | ICD-10-CM

## 2013-12-11 NOTE — Progress Notes (Signed)
Pre-visit discussion using our clinic review tool. No additional management support is needed unless otherwise documented below in the visit note.  

## 2013-12-11 NOTE — Progress Notes (Signed)
Orders placed for f/u labs.  

## 2013-12-13 DIAGNOSIS — N812 Incomplete uterovaginal prolapse: Secondary | ICD-10-CM | POA: Diagnosis not present

## 2013-12-13 DIAGNOSIS — Z1231 Encounter for screening mammogram for malignant neoplasm of breast: Secondary | ICD-10-CM | POA: Diagnosis not present

## 2013-12-13 DIAGNOSIS — N898 Other specified noninflammatory disorders of vagina: Secondary | ICD-10-CM | POA: Diagnosis not present

## 2013-12-13 DIAGNOSIS — N952 Postmenopausal atrophic vaginitis: Secondary | ICD-10-CM | POA: Diagnosis not present

## 2013-12-13 LAB — HM MAMMOGRAPHY: HM Mammogram: NEGATIVE

## 2013-12-14 DIAGNOSIS — L259 Unspecified contact dermatitis, unspecified cause: Secondary | ICD-10-CM | POA: Diagnosis not present

## 2013-12-14 DIAGNOSIS — L821 Other seborrheic keratosis: Secondary | ICD-10-CM | POA: Diagnosis not present

## 2013-12-14 DIAGNOSIS — B353 Tinea pedis: Secondary | ICD-10-CM | POA: Diagnosis not present

## 2013-12-14 DIAGNOSIS — D1801 Hemangioma of skin and subcutaneous tissue: Secondary | ICD-10-CM | POA: Diagnosis not present

## 2013-12-14 DIAGNOSIS — B009 Herpesviral infection, unspecified: Secondary | ICD-10-CM | POA: Diagnosis not present

## 2013-12-14 DIAGNOSIS — L819 Disorder of pigmentation, unspecified: Secondary | ICD-10-CM | POA: Diagnosis not present

## 2013-12-14 DIAGNOSIS — D239 Other benign neoplasm of skin, unspecified: Secondary | ICD-10-CM | POA: Diagnosis not present

## 2013-12-16 ENCOUNTER — Encounter: Payer: Self-pay | Admitting: Internal Medicine

## 2013-12-16 NOTE — Assessment & Plan Note (Signed)
S/p ablation and has done well.  Asymptomatic.  Follow.  

## 2013-12-16 NOTE — Assessment & Plan Note (Signed)
On crestor.  Check lipid panel and liver function.  

## 2013-12-16 NOTE — Assessment & Plan Note (Signed)
Mild bronchiectasis.  Sees Dr Kussin.  Currently doing well.  Follow.   

## 2013-12-16 NOTE — Progress Notes (Signed)
Subjective:    Patient ID: Jody Howard, female    DOB: [birth year], 73 y.o.   MRN: [id number]  HPI 73 year old female with past history of MVP with moderate MR and TR, atrial tachycardia s/p ablation and hypercholesterolemia who comes in today for a scheduled follow up.  She states she is doing well.  No increased heart rate or palpitations.  No chest pain or tightness.  Stays active.  No increased sob.  Breathing stable.  No nausea or vomiting.  Weight down some, but overall has been stable lately.  Fluctuates a few pounds up and down.  Bowels stable. Saw Dr Cheryl Flash recently.  Has a pessary in place.  Has f/u this week.  Not tolerating.  Plans to discuss with her.  We discussed her stress and underlying psych issues.  Discussed referral back to psychiatry.  She declines.  Does not feel she needs at this point.  Feels better.  Discussed at length with her today.       Past Medical History  Diagnosis Date  . Mitral valve prolapse   . Rheumatic fever   . Clostridium difficile infection 1997    required hospitalization  . Anemia   . Hyperlipidemia   . Atrial tachycardia     s/p ablation    Current Outpatient Prescriptions on File Prior to Visit  Medication Sig Dispense Refill  . Cholecalciferol (VITAMIN D-3 PO) Take by mouth.      . Multiple Vitamins-Minerals (MULTIVITAMIN PO) Take by mouth.      . rosuvastatin (CRESTOR) 5 MG tablet Take 0.5 tablets (2.5 mg total) by mouth daily.  30 tablet  1   No current facility-administered medications on file prior to visit.    Review of Systems Patient denies any headache, lightheadedness or dizziness.  No sinus or allergy symptoms.  No chest pain, tightness or palpitations.  No increased shortness of breath, cough or congestion.  No nausea or vomiting.  No acid reflux.  No abdominal pain or cramping.  No bowel change, such as diarrhea, constipation, BRBPR or melana.  Has pessary in place.  Plans to follow up with Dr Sharlett Iles.   Discussed  stress and family issues at length with her today.  Does not feel she needs anything more at this point.  Declines referral.        Objective:   Physical Exam  Filed Vitals:   12/11/13 0842  BP: 100/58  Pulse: 59  Temp: 97.9 F (35.57 C)   73 year old female in no acute distress.   HEENT:  Nares- clear.  Oropharynx - without lesions. NECK:  Supple.  Nontender.  No audible bruit.  HEART:  Appears to be regular. LUNGS:  No crackles or wheezing audible.  Respirations even and unlabored.  RADIAL PULSE:  Equal bilaterally.   ABDOMEN:  Soft, nontender.  Bowel sounds present and normal.  No audible abdominal bruit.   EXTREMITIES:  No increased edema present.  DP pulses palpable and equal bilaterally.  Pain left foot/base of toe.  No increased redness or swelling.           Assessment & Plan:  PREVIOUS WEIGHT LOSS.  Weight as outlined.  Eating well.  Follow.  HEALTH MAINTENANCE.  Physical 06/05/13.  Colonoscopy 01/10/09 normal.  Sees Dr Evalee Mutton.  Mammogram 11/15/12 - BiRADS I.  Schedule f/u mammogram.    I spent more than 40 minutes with this pt and more than 50% of the time was spent in consultation  regarding the above.

## 2013-12-16 NOTE — Assessment & Plan Note (Signed)
Has seen Dr Sharlett Iles.  Has pessary in place.  Some problems with the pessary.  Plans to discuss with Dr Sharlett Iles at her upcoming appt this week.

## 2013-12-16 NOTE — Assessment & Plan Note (Signed)
Currently doing well.  Follow.  Asymptomatic.  

## 2013-12-17 ENCOUNTER — Encounter: Payer: Self-pay | Admitting: Internal Medicine

## 2014-01-04 DIAGNOSIS — N952 Postmenopausal atrophic vaginitis: Secondary | ICD-10-CM | POA: Diagnosis not present

## 2014-01-04 DIAGNOSIS — N812 Incomplete uterovaginal prolapse: Secondary | ICD-10-CM | POA: Diagnosis not present

## 2014-02-08 ENCOUNTER — Other Ambulatory Visit: Payer: Medicare Other

## 2014-03-07 ENCOUNTER — Other Ambulatory Visit (INDEPENDENT_AMBULATORY_CARE_PROVIDER_SITE_OTHER): Payer: Medicare Other

## 2014-03-07 DIAGNOSIS — E7800 Pure hypercholesterolemia, unspecified: Secondary | ICD-10-CM

## 2014-03-07 DIAGNOSIS — I498 Other specified cardiac arrhythmias: Secondary | ICD-10-CM | POA: Diagnosis not present

## 2014-03-07 LAB — COMPREHENSIVE METABOLIC PANEL
ALT: 18 U/L (ref 0–35)
AST: 24 U/L (ref 0–37)
Albumin: 4 g/dL (ref 3.5–5.2)
Alkaline Phosphatase: 56 U/L (ref 39–117)
BUN: 16 mg/dL (ref 6–23)
CALCIUM: 9.2 mg/dL (ref 8.4–10.5)
CO2: 28 meq/L (ref 19–32)
Chloride: 104 mEq/L (ref 96–112)
Creatinine, Ser: 0.9 mg/dL (ref 0.4–1.2)
GFR: 64.52 mL/min (ref >60.00)
Glucose, Bld: 84 mg/dL (ref 70–99)
Potassium: 4.1 mEq/L (ref 3.5–5.1)
SODIUM: 140 meq/L (ref 135–145)
TOTAL PROTEIN: 6.8 g/dL (ref 6.0–8.3)
Total Bilirubin: 0.8 mg/dL (ref 0.3–1.2)

## 2014-03-07 LAB — CBC WITH DIFFERENTIAL/PLATELET
BASOS ABS: 0 10*3/uL (ref 0.0–0.1)
Basophils Relative: 0.8 % (ref 0.0–3.0)
Eosinophils Absolute: 0.3 10*3/uL (ref 0.0–0.7)
Eosinophils Relative: 5.6 % — ABNORMAL HIGH (ref 0.0–5.0)
HCT: 38.1 % (ref 36.0–46.0)
Hemoglobin: 12.7 g/dL (ref 12.0–15.0)
LYMPHS PCT: 23.3 % (ref 12.0–46.0)
Lymphs Abs: 1.2 10*3/uL (ref 0.7–4.0)
MCHC: 33.4 g/dL (ref 30.0–36.0)
MCV: 93.6 fl (ref 78.0–100.0)
MONOS PCT: 10 % (ref 3.0–12.0)
Monocytes Absolute: 0.5 10*3/uL (ref 0.1–1.0)
Neutro Abs: 3.2 10*3/uL (ref 1.4–7.7)
Neutrophils Relative %: 60.3 % (ref 43.0–77.0)
PLATELETS: 357 10*3/uL (ref 150.0–400.0)
RBC: 4.07 Mil/uL (ref 3.87–5.11)
RDW: 13.6 % (ref 11.5–14.6)
WBC: 5.3 10*3/uL (ref 4.5–10.5)

## 2014-03-07 LAB — LIPID PANEL
CHOL/HDL RATIO: 3
Cholesterol: 184 mg/dL (ref 0–200)
HDL: 69.5 mg/dL (ref >39.00)
LDL Cholesterol: 103 mg/dL — ABNORMAL HIGH (ref 0–99)
Triglycerides: 60 mg/dL (ref 0.0–149.0)
VLDL: 12 mg/dL (ref 0.0–40.0)

## 2014-03-07 LAB — TSH: TSH: 1.55 u[IU]/mL (ref 0.35–5.50)

## 2014-03-11 ENCOUNTER — Encounter: Payer: Self-pay | Admitting: *Deleted

## 2014-03-13 ENCOUNTER — Ambulatory Visit: Payer: Medicare Other | Admitting: Internal Medicine

## 2014-04-03 ENCOUNTER — Ambulatory Visit (INDEPENDENT_AMBULATORY_CARE_PROVIDER_SITE_OTHER): Payer: Medicare Other | Admitting: Internal Medicine

## 2014-04-03 ENCOUNTER — Encounter: Payer: Self-pay | Admitting: Internal Medicine

## 2014-04-03 VITALS — BP 124/62 | HR 60 | Resp 16 | Ht 64.0 in | Wt 113.5 lb

## 2014-04-03 DIAGNOSIS — I498 Other specified cardiac arrhythmias: Secondary | ICD-10-CM

## 2014-04-03 DIAGNOSIS — R079 Chest pain, unspecified: Secondary | ICD-10-CM | POA: Diagnosis not present

## 2014-04-03 DIAGNOSIS — I471 Supraventricular tachycardia: Secondary | ICD-10-CM | POA: Diagnosis not present

## 2014-04-03 DIAGNOSIS — E7800 Pure hypercholesterolemia, unspecified: Secondary | ICD-10-CM

## 2014-04-03 DIAGNOSIS — M79602 Pain in left arm: Secondary | ICD-10-CM

## 2014-04-03 DIAGNOSIS — N8110 Cystocele, unspecified: Secondary | ICD-10-CM

## 2014-04-03 DIAGNOSIS — J479 Bronchiectasis, uncomplicated: Secondary | ICD-10-CM

## 2014-04-03 DIAGNOSIS — M79609 Pain in unspecified limb: Secondary | ICD-10-CM

## 2014-04-03 NOTE — Progress Notes (Signed)
Pre visit review using our clinic review tool, if applicable. No additional management support is needed unless otherwise documented below in the visit note. 

## 2014-04-07 ENCOUNTER — Encounter: Payer: Self-pay | Admitting: Internal Medicine

## 2014-04-07 DIAGNOSIS — M79602 Pain in left arm: Secondary | ICD-10-CM | POA: Insufficient documentation

## 2014-04-07 NOTE — Assessment & Plan Note (Signed)
Currently doing well.  Follow.  Asymptomatic.  

## 2014-04-07 NOTE — Assessment & Plan Note (Signed)
Mild bronchiectasis.  Sees Dr Kussin.  Currently doing well.  Follow.   

## 2014-04-07 NOTE — Progress Notes (Signed)
  Subjective:    Patient ID: Jody Howard, female    DOB: 1941/04/17, 73 y.o.   MRN: [id number]  HPI 73 year old female with past history of MVP with moderate MR and TR, atrial tachycardia s/p ablation and hypercholesterolemia who comes in today for a scheduled follow up.  She states she is doing relatively well.  No increased heart rate or palpitations.  No chest pain or tightness.  She does report noticing some left arm pain when she is stressed.  This is new for her.  Tries to stay active.  No increased sob.  Breathing stable.  No nausea or vomiting.  Weight relatively stable from last check.  Bowels stable.   We discussed her stress and underlying psych issues.  Since her last visit, her husband has passed.  She feels she is handling things relatively well.  Still with increased family stress.  Discussed referral back to psychiatry.  She declines.  Does not feel she needs at this point.  Feels better.  Discussed at length with her today.       Past Medical History  Diagnosis Date  . Mitral valve prolapse   . Rheumatic fever   . Clostridium difficile infection 1997    required hospitalization  . Anemia   . Hyperlipidemia   . Atrial tachycardia     s/p ablation    Current Outpatient Prescriptions on File Prior to Visit  Medication Sig Dispense Refill  . Cholecalciferol (VITAMIN D-3 PO) Take by mouth.      . Multiple Vitamins-Minerals (MULTIVITAMIN PO) Take by mouth.      . rosuvastatin (CRESTOR) 5 MG tablet Take 0.5 tablets (2.5 mg total) by mouth daily.  30 tablet  1   No current facility-administered medications on file prior to visit.    Review of Systems Patient denies any headache, lightheadedness or dizziness.  No sinus or allergy symptoms.  No chest pain, tightness or palpitations.  Does report the left arm pain with increased stress.  No increased shortness of breath, cough or congestion. No nausea or vomiting.  No acid reflux.  No abdominal pain or cramping.  No bowel change,  such as diarrhea, constipation, BRBPR or melana.  Discussed stress and family issues at length with her today.  Does not feel she needs anything more at this point.  Declines referral.        Objective:   Physical Exam  Filed Vitals:   04/03/14 1333  BP: 124/62  Pulse: 60  Resp: 33   73 year old female in no acute distress.   HEENT:  Nares- clear.  Oropharynx - without lesions. NECK:  Supple.  Nontender.  No audible bruit.  HEART:  Appears to be regular. LUNGS:  No crackles or wheezing audible.  Respirations even and unlabored.  RADIAL PULSE:  Equal bilaterally.   ABDOMEN:  Soft, nontender.  Bowel sounds present and normal.  No audible abdominal bruit.   EXTREMITIES:  No increased edema present.  DP pulses palpable and equal bilaterally.           Assessment & Plan:  PREVIOUS WEIGHT LOSS.  Weight as outlined.  Eating well.  Follow.  HEALTH MAINTENANCE.  Physical 06/05/13.  Colonoscopy 01/10/09 normal.  Sees Dr Evalee Mutton.  Mammogram 12/13/13 - Birads I.  I spent more than 40 minutes with this pt and more than 50% of the time was spent in consultation regarding the above.

## 2014-04-07 NOTE — Assessment & Plan Note (Signed)
Has seen Dr Sharlett Iles.

## 2014-04-07 NOTE — Assessment & Plan Note (Signed)
S/p ablation and has done well.   Follow.

## 2014-04-07 NOTE — Assessment & Plan Note (Signed)
Only occurs with increased stress.  EKG obtained and revealed SR with no acute ischemic changes noted.  Given this change with the left arm pain with stress, will refer to cardiology for further evaluation and testing.  She prefers to see her previous cardiologist.  States saw Dr Tamala Julian in [place].  Will refer.

## 2014-04-07 NOTE — Assessment & Plan Note (Signed)
On crestor.  Follow lipid panel and liver function.

## 2014-04-24 DIAGNOSIS — M25579 Pain in unspecified ankle and joints of unspecified foot: Secondary | ICD-10-CM | POA: Diagnosis not present

## 2014-05-01 ENCOUNTER — Other Ambulatory Visit: Payer: Self-pay | Admitting: Internal Medicine

## 2014-05-27 ENCOUNTER — Ambulatory Visit: Payer: Medicare Other | Admitting: Interventional Cardiology

## 2014-05-31 DIAGNOSIS — N812 Incomplete uterovaginal prolapse: Secondary | ICD-10-CM | POA: Diagnosis not present

## 2014-05-31 DIAGNOSIS — Z4689 Encounter for fitting and adjustment of other specified devices: Secondary | ICD-10-CM | POA: Diagnosis not present

## 2014-06-05 DIAGNOSIS — M25579 Pain in unspecified ankle and joints of unspecified foot: Secondary | ICD-10-CM | POA: Diagnosis not present

## 2014-08-03 ENCOUNTER — Encounter: Payer: Self-pay | Admitting: Interventional Cardiology

## 2014-08-07 ENCOUNTER — Ambulatory Visit: Payer: Medicare Other | Admitting: Internal Medicine

## 2014-08-19 DIAGNOSIS — H10219 Acute toxic conjunctivitis, unspecified eye: Secondary | ICD-10-CM | POA: Diagnosis not present

## 2014-09-24 ENCOUNTER — Ambulatory Visit (INDEPENDENT_AMBULATORY_CARE_PROVIDER_SITE_OTHER): Payer: Medicare Other | Admitting: Internal Medicine

## 2014-09-24 ENCOUNTER — Encounter: Payer: Self-pay | Admitting: Internal Medicine

## 2014-09-24 VITALS — BP 100/60 | HR 54 | Temp 98.1°F | Ht 64.0 in | Wt 113.2 lb

## 2014-09-24 DIAGNOSIS — I471 Supraventricular tachycardia, unspecified: Secondary | ICD-10-CM

## 2014-09-24 DIAGNOSIS — M79602 Pain in left arm: Secondary | ICD-10-CM

## 2014-09-24 DIAGNOSIS — N8110 Cystocele, unspecified: Secondary | ICD-10-CM | POA: Diagnosis not present

## 2014-09-24 DIAGNOSIS — E7800 Pure hypercholesterolemia, unspecified: Secondary | ICD-10-CM

## 2014-09-24 DIAGNOSIS — Z1211 Encounter for screening for malignant neoplasm of colon: Secondary | ICD-10-CM | POA: Diagnosis not present

## 2014-09-24 DIAGNOSIS — N939 Abnormal uterine and vaginal bleeding, unspecified: Secondary | ICD-10-CM

## 2014-09-24 DIAGNOSIS — J479 Bronchiectasis, uncomplicated: Secondary | ICD-10-CM | POA: Diagnosis not present

## 2014-09-24 DIAGNOSIS — R1032 Left lower quadrant pain: Secondary | ICD-10-CM

## 2014-09-24 LAB — URINALYSIS, ROUTINE W REFLEX MICROSCOPIC
BILIRUBIN URINE: NEGATIVE
HGB URINE DIPSTICK: NEGATIVE
KETONES UR: NEGATIVE
Leukocytes, UA: NEGATIVE
Nitrite: NEGATIVE
PH: 5.5 (ref 5.0–8.0)
RBC / HPF: NONE SEEN (ref 0–?)
Specific Gravity, Urine: 1.025 (ref 1.000–1.030)
TOTAL PROTEIN, URINE-UPE24: NEGATIVE
Urine Glucose: NEGATIVE
Urobilinogen, UA: 0.2 (ref 0.0–1.0)

## 2014-09-24 LAB — CBC WITH DIFFERENTIAL/PLATELET
BASOS PCT: 1.1 % (ref 0.0–3.0)
Basophils Absolute: 0.1 10*3/uL (ref 0.0–0.1)
Eosinophils Absolute: 0.3 10*3/uL (ref 0.0–0.7)
Eosinophils Relative: 4.4 % (ref 0.0–5.0)
HEMATOCRIT: 40.1 % (ref 36.0–46.0)
HEMOGLOBIN: 12.9 g/dL (ref 12.0–15.0)
LYMPHS ABS: 1.2 10*3/uL (ref 0.7–4.0)
LYMPHS PCT: 20.9 % (ref 12.0–46.0)
MCHC: 32.3 g/dL (ref 30.0–36.0)
MCV: 93.5 fl (ref 78.0–100.0)
MONOS PCT: 8.6 % (ref 3.0–12.0)
Monocytes Absolute: 0.5 10*3/uL (ref 0.1–1.0)
NEUTROS ABS: 3.8 10*3/uL (ref 1.4–7.7)
Neutrophils Relative %: 65 % (ref 43.0–77.0)
Platelets: 403 10*3/uL — ABNORMAL HIGH (ref 150.0–400.0)
RBC: 4.28 Mil/uL (ref 3.87–5.11)
RDW: 13.1 % (ref 11.5–15.5)
WBC: 5.8 10*3/uL (ref 4.0–10.5)

## 2014-09-24 LAB — LIPID PANEL
Cholesterol: 154 mg/dL (ref 0–200)
HDL: 45 mg/dL (ref >39.00)
LDL Cholesterol: 94 mg/dL (ref 0–99)
NONHDL: 109
Total CHOL/HDL Ratio: 3
Triglycerides: 75 mg/dL (ref 0.0–149.0)
VLDL: 15 mg/dL (ref 0.0–40.0)

## 2014-09-24 LAB — COMPREHENSIVE METABOLIC PANEL
ALBUMIN: 3.4 g/dL — AB (ref 3.5–5.2)
ALT: 14 U/L (ref 0–35)
AST: 24 U/L (ref 0–37)
Alkaline Phosphatase: 65 U/L (ref 39–117)
BILIRUBIN TOTAL: 0.5 mg/dL (ref 0.2–1.2)
BUN: 15 mg/dL (ref 6–23)
CO2: 29 mEq/L (ref 19–32)
Calcium: 9.6 mg/dL (ref 8.4–10.5)
Chloride: 108 mEq/L (ref 96–112)
Creatinine, Ser: 0.8 mg/dL (ref 0.4–1.2)
GFR: 72.65 mL/min (ref >60.00)
Glucose, Bld: 94 mg/dL (ref 70–99)
Potassium: 4.4 mEq/L (ref 3.5–5.1)
Sodium: 142 mEq/L (ref 135–145)
Total Protein: 7.1 g/dL (ref 6.0–8.3)

## 2014-09-24 NOTE — Progress Notes (Signed)
Subjective:    Patient ID: Jody Howard, female    DOB: [birth year], 73 y.o.   MRN: [id number]  HPI 73 year old female with past history of MVP with moderate MR and TR, atrial tachycardia s/p ablation and hypercholesterolemia who comes in today for a scheduled follow up.  She states she is doing relatively well.  No increased heart rate or palpitations.  No chest pain or tightness.  No left arm pain.  Did not see her cardiologist.  States is very active and has had no problems.  She just went on a trip overseas.  Went hiking.  No problems hiking.  No increased sob.  Breathing stable.  No nausea or vomiting.  Weight relatively stable from last check.  Bowels stable.  Some loose stool at times.  Last colonoscopy 2010.  States due.  States she is handling stress better. Feels better.  She does report some problems with bladder/vaginal prolapse.  Some left lower quadrant pain (intermittent).  Has seen Dr Cheryl Flash previously.  request referral back.          Past Medical History  Diagnosis Date  . Mitral valve prolapse   . Rheumatic fever   . Clostridium difficile infection 1997    required hospitalization  . Anemia   . Hyperlipidemia   . Atrial tachycardia     s/p ablation  . Paroxysmal supraventricular tachycardia     Current Outpatient Prescriptions on File Prior to Visit  Medication Sig Dispense Refill  . Cholecalciferol (VITAMIN D-3 PO) Take by mouth.      . CRESTOR 5 MG tablet TAKE 1/2 TABLET BY MOUTH DAILY  30 tablet  2  . Multiple Vitamins-Minerals (MULTIVITAMIN PO) Take by mouth.       No current facility-administered medications on file prior to visit.    Review of Systems Patient denies any headache, lightheadedness or dizziness.  No sinus or allergy symptoms.  No chest pain, tightness or palpitations.   No increased shortness of breath, cough or congestion. No nausea or vomiting.  No acid reflux.  No abdominal pain or cramping.  No bowel change, such as constipation,  BRBPR or melana.  Some occasional loose stool.  Feels she is doing better regarding increased stress.  Some problems with vaginal/bladder prolapse.  States eating well.  Overall feels good.        Objective:   Physical Exam  Filed Vitals:   09/24/14 0756  BP: 100/60  Pulse: 54  Temp: 98.1 F (36.7 C)   Blood pressure recheck:  120/62, pulse 45-66  73 year old female in no acute distress.   HEENT:  Nares- clear.  Oropharynx - without lesions. NECK:  Supple.  Nontender.  No audible bruit.  HEART:  Appears to be regular. LUNGS:  No crackles or wheezing audible.  Respirations even and unlabored.  RADIAL PULSE:  Equal bilaterally.   ABDOMEN:  Soft, nontender.  Bowel sounds present and normal.  No audible abdominal bruit.   EXTREMITIES:  No increased edema present.  DP pulses palpable and equal bilaterally.           Assessment & Plan:  PREVIOUS WEIGHT LOSS.  Weight as outlined.  Stable. Eating well.  Follow.  HEALTH MAINTENANCE.  Physical 06/05/13.  Colonoscopy 01/10/09 normal.  Sees Dr Evalee Mutton. Refer back for f/u colonoscopy. Mammogram 12/13/13 - Birads I.  Problem List Items Addressed This Visit   Abdominal pain, left lower quadrant     Refer to Dr Sharlett Iles  as outlined.       Relevant Orders      Ambulatory referral to Gastroenterology      Ambulatory referral to Gynecology   Atrial tachycardia     Currently doing well.  Follow.  Asymptomatic.     Bronchiectasis     Mild bronchiectasis.  Sees Dr Madalyn Rob.  Currently doing well.  Follow.      Female bladder prolapse   Relevant Orders      Urinalysis, Routine w reflex microscopic (Completed)      CULTURE, URINE COMPREHENSIVE (Completed)      Ambulatory referral to Gynecology   Left arm pain     Not an issue now.  Very active with no cardiac symptoms.       Paroxysmal SVT (supraventricular tachycardia)     S/p ablation and has done well.  Asymptomatic.  Follow.     Pure hypercholesterolemia - Primary     On crestor.   Follow lipid panel and liver function.      Relevant Orders      Lipid panel (Completed)      Comprehensive metabolic panel (Completed)   Vaginal bleeding     States with the prolapse, she stuck a tampon up into her vagina.  When she pulled it out, noticed blood.  Refer to Dr Sharlett Iles for further evaluation.       Relevant Orders      CBC with Differential (Completed)      Ambulatory referral to Gynecology   Vaginal prolapse     Has seen Dr Sharlett Iles.  Persistent/worsening issues.  Refer back to Dr Sharlett Iles.        Other Visit Diagnoses   Colon cancer screening        Relevant Orders       Ambulatory referral to Gastroenterology      I spent 25 minutes with this pt and more than 50% of the time was spent in consultation regarding the above.

## 2014-09-25 ENCOUNTER — Other Ambulatory Visit: Payer: Self-pay | Admitting: Internal Medicine

## 2014-09-25 DIAGNOSIS — D75839 Thrombocytosis, unspecified: Secondary | ICD-10-CM

## 2014-09-25 DIAGNOSIS — D473 Essential (hemorrhagic) thrombocythemia: Secondary | ICD-10-CM

## 2014-09-25 LAB — CULTURE, URINE COMPREHENSIVE
Colony Count: NO GROWTH
Organism ID, Bacteria: NO GROWTH

## 2014-09-25 NOTE — Progress Notes (Signed)
Order placed for f/u platelet count.  

## 2014-09-29 ENCOUNTER — Encounter: Payer: Self-pay | Admitting: Internal Medicine

## 2014-09-29 NOTE — Assessment & Plan Note (Signed)
Mild bronchiectasis.  Sees Dr Madalyn Rob.  Currently doing well.  Follow.

## 2014-09-29 NOTE — Assessment & Plan Note (Signed)
Has seen Dr Sharlett Iles.  Persistent/worsening issues.  Refer back to Dr Sharlett Iles.

## 2014-09-29 NOTE — Assessment & Plan Note (Signed)
Currently doing well.  Follow.  Asymptomatic.

## 2014-09-29 NOTE — Assessment & Plan Note (Signed)
Refer to Dr Sharlett Iles as outlined.

## 2014-09-29 NOTE — Assessment & Plan Note (Signed)
Not an issue now.  Very active with no cardiac symptoms.

## 2014-09-29 NOTE — Assessment & Plan Note (Signed)
On crestor.  Follow lipid panel and liver function.

## 2014-09-29 NOTE — Assessment & Plan Note (Signed)
States with the prolapse, she stuck a tampon up into her vagina.  When she pulled it out, noticed blood.  Refer to Dr Sharlett Iles for further evaluation.

## 2014-09-29 NOTE — Assessment & Plan Note (Signed)
S/p ablation and has done well.  Asymptomatic.  Follow.

## 2014-10-02 DIAGNOSIS — N813 Complete uterovaginal prolapse: Secondary | ICD-10-CM | POA: Diagnosis not present

## 2014-10-04 ENCOUNTER — Telehealth: Payer: Self-pay

## 2014-10-04 NOTE — Telephone Encounter (Signed)
She saw that Dr. Already & she wanted to talk with you about that visit. She gave her some options (for surgery) and she wants your option. She goes back on 10/07/14 to discuss the two options & set a date for surgery. She wants to know if you think she should have her ovaries & uterus removed to just be done with it. She feels that she is in good shape right now & feels like this is a good time to do it. She also mentioned that they never received any records from this office so they told her they would just examine her & go from there because they were not sure exactly why she was referred.

## 2014-10-04 NOTE — Telephone Encounter (Signed)
Please inform her that I am out of the office.  I spoke with her at length (at her last appt) about this matter.  We were going to refer her back to the uro -gynecologist that she had seen previously.  Does she want to do something different now?

## 2014-10-04 NOTE — Telephone Encounter (Signed)
The patient called and is hoping to speak with Dr.Scott regarding her bladder problems and possible surgery. She stated she does not want an appointment, but simply to speak with Dr.Scott over the phone to run options by her.  I explained this is unusual, but I would be happy to send a note to see what we could do for her.  Please advise if you would rather work this patient in.   Callback - [phone]

## 2014-10-04 NOTE — Telephone Encounter (Signed)
Tried calling patient back to have questions answered.  No answer, lvmom.

## 2014-10-07 NOTE — Telephone Encounter (Signed)
Called pt and discussed.  Questions answered.  She plans to f/u with urogyn 10/15/14.

## 2014-10-09 ENCOUNTER — Ambulatory Visit (INDEPENDENT_AMBULATORY_CARE_PROVIDER_SITE_OTHER): Payer: Medicare Other | Admitting: *Deleted

## 2014-10-09 ENCOUNTER — Other Ambulatory Visit (INDEPENDENT_AMBULATORY_CARE_PROVIDER_SITE_OTHER): Payer: Medicare Other

## 2014-10-09 ENCOUNTER — Ambulatory Visit: Payer: Medicare Other

## 2014-10-09 DIAGNOSIS — Z23 Encounter for immunization: Secondary | ICD-10-CM | POA: Diagnosis not present

## 2014-10-09 DIAGNOSIS — D473 Essential (hemorrhagic) thrombocythemia: Secondary | ICD-10-CM | POA: Diagnosis not present

## 2014-10-09 LAB — PLATELET COUNT: Platelets: 387 10*3/uL (ref 150–400)

## 2014-10-10 ENCOUNTER — Encounter: Payer: Self-pay | Admitting: *Deleted

## 2014-10-11 ENCOUNTER — Telehealth: Payer: Self-pay

## 2014-10-11 ENCOUNTER — Other Ambulatory Visit: Payer: Medicare Other

## 2014-10-11 NOTE — Telephone Encounter (Signed)
Spoke with pt, she says she is leaving in 3-4 weeks.  She said she last had Yellow Fever in 2007.

## 2014-10-11 NOTE — Telephone Encounter (Signed)
Spoke with Jody Howard, the medication she is requesting is Malarone. She mentioned that she talked about this at her last visit & thought it had already been sent it.

## 2014-10-11 NOTE — Telephone Encounter (Signed)
After reviewing, she will need to go to the travel clinic for evaluation.  We usually do not prescribe and there is probably other things she will need prior to travel.

## 2014-10-11 NOTE — Telephone Encounter (Signed)
The patient called and is hoping to get an rx of medication that helps with [place] seas.   I apologize, i could not understand exactly what medication she was trying to pronounce.  She stated she has been prescribed this before for traveling.

## 2014-10-12 NOTE — Telephone Encounter (Signed)
So is pt going to go to travel clinic.  That is what I recommend.  Needs to go asap.

## 2014-10-14 NOTE — Telephone Encounter (Signed)
Spoke with pt, gave her the number and address to [facility] at [phone]; [place], [place], [place] [address].  Pt states she would call today

## 2014-10-15 DIAGNOSIS — N813 Complete uterovaginal prolapse: Secondary | ICD-10-CM | POA: Diagnosis not present

## 2014-10-15 DIAGNOSIS — Z803 Family history of malignant neoplasm of breast: Secondary | ICD-10-CM | POA: Diagnosis not present

## 2014-10-23 DIAGNOSIS — Z23 Encounter for immunization: Secondary | ICD-10-CM | POA: Diagnosis not present

## 2014-12-20 DIAGNOSIS — H04123 Dry eye syndrome of bilateral lacrimal glands: Secondary | ICD-10-CM | POA: Diagnosis not present

## 2014-12-26 ENCOUNTER — Encounter: Payer: Medicare Other | Admitting: Internal Medicine

## 2015-01-01 ENCOUNTER — Other Ambulatory Visit: Payer: Self-pay | Admitting: Dermatology

## 2015-01-01 DIAGNOSIS — D225 Melanocytic nevi of trunk: Secondary | ICD-10-CM | POA: Diagnosis not present

## 2015-01-01 DIAGNOSIS — D2271 Melanocytic nevi of right lower limb, including hip: Secondary | ICD-10-CM | POA: Diagnosis not present

## 2015-01-01 DIAGNOSIS — Z803 Family history of malignant neoplasm of breast: Secondary | ICD-10-CM | POA: Diagnosis not present

## 2015-01-01 DIAGNOSIS — D1801 Hemangioma of skin and subcutaneous tissue: Secondary | ICD-10-CM | POA: Diagnosis not present

## 2015-01-01 DIAGNOSIS — D485 Neoplasm of uncertain behavior of skin: Secondary | ICD-10-CM | POA: Diagnosis not present

## 2015-01-01 DIAGNOSIS — Z1231 Encounter for screening mammogram for malignant neoplasm of breast: Secondary | ICD-10-CM | POA: Diagnosis not present

## 2015-01-01 DIAGNOSIS — L821 Other seborrheic keratosis: Secondary | ICD-10-CM | POA: Diagnosis not present

## 2015-01-01 DIAGNOSIS — L720 Epidermal cyst: Secondary | ICD-10-CM | POA: Diagnosis not present

## 2015-01-01 DIAGNOSIS — L853 Xerosis cutis: Secondary | ICD-10-CM | POA: Diagnosis not present

## 2015-01-01 LAB — HM MAMMOGRAPHY: HM Mammogram: NEGATIVE

## 2015-01-02 ENCOUNTER — Encounter: Payer: Self-pay | Admitting: *Deleted

## 2015-01-14 ENCOUNTER — Telehealth: Payer: Self-pay | Admitting: Interventional Cardiology

## 2015-01-14 NOTE — Telephone Encounter (Signed)
Returned pt call. Pt sts that she has had reoccurring left arm pain. The pain does not radiate, she denies chest pain, sob, swelling,nauseu. The pain is relieved with Aspirin, she is unable to rate the pain, she describes as dull. Adv her to keep appt with Dr.Smith on 2/12 and to call back if other symptoms develop. She verbalized understanding.

## 2015-01-14 NOTE — Telephone Encounter (Signed)
Pt has pain in her shoulders and arms,  1. Are you having shoulder and arm pain right now? Yes 2. Are you experiencing any other symptoms (ex. SOB, nausea, vomiting, sweating)? No 3. How long have you been experiencing shoulder and arm pain? Within the last 72 hours  4. Is your pain continuous or coming and going? Continual aches not necessarily pain 5. Have you taken Nitroglycerin? NO 6. She takes two Asprin and then it goes away but then the pains comes back. Made appt for 02/12 @ 8:15am.

## 2015-01-17 ENCOUNTER — Ambulatory Visit (INDEPENDENT_AMBULATORY_CARE_PROVIDER_SITE_OTHER): Payer: Medicare Other | Admitting: Interventional Cardiology

## 2015-01-17 ENCOUNTER — Encounter: Payer: Self-pay | Admitting: Interventional Cardiology

## 2015-01-17 VITALS — BP 102/68 | HR 61 | Ht 64.0 in | Wt 117.0 lb

## 2015-01-17 DIAGNOSIS — I4719 Other supraventricular tachycardia: Secondary | ICD-10-CM

## 2015-01-17 DIAGNOSIS — I471 Supraventricular tachycardia, unspecified: Secondary | ICD-10-CM

## 2015-01-17 DIAGNOSIS — M79602 Pain in left arm: Secondary | ICD-10-CM

## 2015-01-17 DIAGNOSIS — E7800 Pure hypercholesterolemia, unspecified: Secondary | ICD-10-CM

## 2015-01-17 NOTE — Patient Instructions (Signed)
Your physician recommends that you schedule a follow-up appointment as needed with Dr. Tamala Julian

## 2015-01-17 NOTE — Progress Notes (Signed)
Patient ID: Jody Howard, female   DOB: 09-Nov-1941, 74 y.o.   MRN: [id number]    Cardiology Office Note   Date:  01/17/2015   ID:  Jody Howard, DOB [birth year], MRN [id number]  PCP:  Einar Pheasant, MD  Cardiologist:   Sinclair Grooms, MD   No chief complaint on file.     History of Present Illness: Jody Howard is a 74 y.o. female who presents for left arm pain. The pain is atypical. It is relieved by aspirin. It is nonexertional. She denies dyspnea and other associated complaints.     Past Medical History  Diagnosis Date  . Mitral valve prolapse   . Rheumatic fever   . Clostridium difficile infection 1997    required hospitalization  . Anemia   . Hyperlipidemia   . Atrial tachycardia     s/p ablation  . Paroxysmal supraventricular tachycardia     Past Surgical History  Procedure Laterality Date  . Breast biopsy  1997  . Tonsillectomy  1944  . Ablation      for atrial tachycardia     Current Outpatient Prescriptions  Medication Sig Dispense Refill  . CALCIUM PO Take 1 tablet by mouth daily.     . Cholecalciferol (VITAMIN D-3 PO) Take 1 capsule by mouth daily. Taking 2000 IU daily    . CRESTOR 5 MG tablet TAKE 1/2 TABLET BY MOUTH DAILY 30 tablet 2  . Multiple Vitamins-Minerals (MULTIVITAMIN PO) Take 1 tablet by mouth daily.      No current facility-administered medications for this visit.    Allergies:   Codeine; Flagyl; Lipitor; and Simvastatin    Social History:  The patient  reports that she has never smoked. She has never used smokeless tobacco. She reports that she drinks alcohol. She reports that she does not use illicit drugs.   Family History:  The patient's family history includes Breast cancer in her sister; Cancer in her mother and paternal aunt; Multiple myeloma in her father.    ROS:  Please see the history of present illness.   Otherwise, review of systems are positive for left leg numbness.   All other systems are reviewed and  negative.    PHYSICAL EXAM: VS:  BP 102/68 mmHg  Pulse 61  Ht _0  (1.626 m)  Wt 117 lb (53.071 kg)  BMI 20.07 kg/m2  LMP 01/29/1983 , BMI Body mass index is 20.07 kg/(m^2). GEN: Well nourished, well developed, in no acute distress HEENT: normal Neck: no JVD, carotid bruits, or masses Cardiac: RRR; no murmurs, rubs, or gallops,no edema  Respiratory:  clear to auscultation bilaterally, normal work of breathing GI: soft, nontender, nondistended, + BS MS: no deformity or atrophy Skin: warm and dry, no rash Neuro:  Strength and sensation are intact Psych: euthymic mood, full affect   EKG:  EKG is ordered today. The ekg ordered today demnormal sinus rhythm with normal appearance   Recent Labs: 03/07/2014: TSH 1.55 09/24/2014: ALT 14; BUN 15; Creatinine 0.8; Hemoglobin 12.9; Potassium 4.4; Sodium 142 10/09/2014: Platelets 387    Lipid Panel    Component Value Date/Time   CHOL 154 09/24/2014 0842   TRIG 75.0 09/24/2014 0842   HDL 45.00 09/24/2014 0842   CHOLHDL 3 09/24/2014 0842   VLDL 15.0 09/24/2014 0842   LDLCALC 94 09/24/2014 0842      Wt Readings from Last 3 Encounters:  01/17/15 117 lb (53.071 kg)  09/24/14 113 lb 4 oz (51.37 kg)  04/03/14 113  lb 8 oz (51.483 kg)      Other studies Reviewed: Additional studies/ records that were reviewed today include: . Review of the above records demonstrates: History of junctional reentrant tachycardia treated with ablation by [facility]:  1.  Left arm discomfort, noncardiac in origin 2. Prior history of junctional reentrant tachycardia, treated with ablation by Dr. Tally Due a proximally 10 years ago.   Current medicines are reviewed at length with the patient today.  The patient does not have concerns regarding medicines.  The following changes have been made:  no change  Labs/ tests ordered today include:  No orders of the defined types were placed in this encounter.      Disposition:   FU withH. Smith on an as-needed basis i   Signed, Sinclair Grooms, MD  01/17/2015 8:15 AM    [facility], [place], [place]  [address] Phone: [phone]; Fax: [phone]

## 2015-01-20 ENCOUNTER — Encounter: Payer: Self-pay | Admitting: Internal Medicine

## 2015-01-20 ENCOUNTER — Ambulatory Visit (INDEPENDENT_AMBULATORY_CARE_PROVIDER_SITE_OTHER): Payer: Medicare Other | Admitting: Internal Medicine

## 2015-01-20 VITALS — BP 130/70 | HR 61 | Temp 98.1°F | Ht 64.0 in | Wt 118.2 lb

## 2015-01-20 DIAGNOSIS — Z0000 Encounter for general adult medical examination without abnormal findings: Secondary | ICD-10-CM | POA: Diagnosis not present

## 2015-01-20 DIAGNOSIS — Z658 Other specified problems related to psychosocial circumstances: Secondary | ICD-10-CM

## 2015-01-20 DIAGNOSIS — D473 Essential (hemorrhagic) thrombocythemia: Secondary | ICD-10-CM

## 2015-01-20 DIAGNOSIS — J479 Bronchiectasis, uncomplicated: Secondary | ICD-10-CM | POA: Diagnosis not present

## 2015-01-20 DIAGNOSIS — E7800 Pure hypercholesterolemia, unspecified: Secondary | ICD-10-CM

## 2015-01-20 DIAGNOSIS — D75839 Thrombocytosis, unspecified: Secondary | ICD-10-CM

## 2015-01-20 DIAGNOSIS — E2839 Other primary ovarian failure: Secondary | ICD-10-CM

## 2015-01-20 DIAGNOSIS — N8110 Cystocele, unspecified: Secondary | ICD-10-CM | POA: Diagnosis not present

## 2015-01-20 DIAGNOSIS — Z1211 Encounter for screening for malignant neoplasm of colon: Secondary | ICD-10-CM | POA: Diagnosis not present

## 2015-01-20 DIAGNOSIS — F439 Reaction to severe stress, unspecified: Secondary | ICD-10-CM

## 2015-01-20 DIAGNOSIS — I471 Supraventricular tachycardia: Secondary | ICD-10-CM | POA: Diagnosis not present

## 2015-01-20 NOTE — Progress Notes (Addendum)
Patient ID: Jody Howard, female   DOB: Nov 27, 1941, 74 y.o.   MRN: [id number]   Subjective:    Patient ID: Jody Howard, female    DOB: [birth year], 74 y.o.   MRN: [id number]  HPI  Patient here for her physical exam.  Has a history of atrial tachycardia,  Bronchiectasis and hypercholesterolemia.  Feels her breathing is stable.  No cardiac symptoms with increased activity or exertion.  Eating and drinking well.  Weight stable.  Bowels stable.  Feels she is handling stress well.  Does not feel she needs any further intervention.     Past Medical History  Diagnosis Date  . Mitral valve prolapse   . Rheumatic fever   . Clostridium difficile infection 1997    required hospitalization  . Anemia   . Hyperlipidemia   . Atrial tachycardia     s/p ablation  . Paroxysmal supraventricular tachycardia     Current Outpatient Prescriptions on File Prior to Visit  Medication Sig Dispense Refill  . CALCIUM PO Take 1 tablet by mouth daily.     . Cholecalciferol (VITAMIN D-3 PO) Take 1 capsule by mouth daily. Taking 2000 IU daily    . CRESTOR 5 MG tablet TAKE 1/2 TABLET BY MOUTH DAILY 30 tablet 2  . Multiple Vitamins-Minerals (MULTIVITAMIN PO) Take 1 tablet by mouth daily.      No current facility-administered medications on file prior to visit.    Review of Systems  Constitutional: Negative for appetite change and unexpected weight change.  HENT: Negative for congestion, sinus pressure and sore throat.   Eyes: Negative for pain and visual disturbance.  Respiratory: Negative for cough, chest tightness and shortness of breath.   Cardiovascular: Negative for chest pain, palpitations and leg swelling.  Gastrointestinal: Negative for abdominal pain, diarrhea and constipation.  Genitourinary: Negative for frequency and difficulty urinating.       Does report persistent problems with prolapse.    Musculoskeletal: Negative for back pain and joint swelling.  Skin: Negative for color change and  rash.  Neurological: Negative for dizziness, light-headedness and headaches.  Hematological: Negative for adenopathy. Does not bruise/bleed easily.  Psychiatric/Behavioral: Negative for dysphoric mood and decreased concentration.       Feels better.         Objective:    Physical Exam  Constitutional: She is oriented to person, place, and time. She appears well-developed and well-nourished.  HENT:  Nose: Nose normal.  Mouth/Throat: Oropharynx is clear and moist.  Eyes: Right eye exhibits no discharge. Left eye exhibits no discharge. No scleral icterus.  Neck: Neck supple. No thyromegaly present.  Cardiovascular: Normal rate and regular rhythm.   Pulmonary/Chest: Breath sounds normal. No accessory muscle usage. No tachypnea. No respiratory distress. She has no decreased breath sounds. She has no wheezes. She has no rhonchi. Right breast exhibits no inverted nipple, no mass, no nipple discharge and no tenderness (no axillary adenopathy). Left breast exhibits no inverted nipple, no mass, no nipple discharge and no tenderness (no axilarry adenopathy).  Abdominal: Soft. Bowel sounds are normal. There is no tenderness.  Musculoskeletal: She exhibits no edema or tenderness.  Lymphadenopathy:    She has no cervical adenopathy.  Neurological: She is alert and oriented to person, place, and time.  Skin: Skin is warm. No rash noted.  Psychiatric: She has a normal mood and affect. Her behavior is normal.    BP 130/70 mmHg  Pulse 61  Temp(Src) 98.1 F (36.7 C) (Oral)  Ht 5'  4" (1.626 m)  Wt 118 lb 4 oz (53.638 kg)  BMI 20.29 kg/m2  SpO2 98%  LMP 01/29/1983 Wt Readings from Last 3 Encounters:  01/20/15 118 lb 4 oz (53.638 kg)  01/17/15 117 lb (53.071 kg)  09/24/14 113 lb 4 oz (51.37 kg)     Lab Results  Component Value Date   WBC 5.8 09/24/2014   HGB 12.9 09/24/2014   HCT 40.1 09/24/2014   PLT 387 10/09/2014   GLUCOSE 94 09/24/2014   CHOL 154 09/24/2014   TRIG 75.0 09/24/2014    HDL 45.00 09/24/2014   LDLCALC 94 09/24/2014   ALT 14 09/24/2014   AST 24 09/24/2014   NA 142 09/24/2014   K 4.4 09/24/2014   CL 108 09/24/2014   CREATININE 0.8 09/24/2014   BUN 15 09/24/2014   CO2 29 09/24/2014   TSH 1.55 03/07/2014       Assessment & Plan:   Problem List Items Addressed This Visit    Atrial tachycardia - Primary    Just saw her cardiologist.  Doing well.  Stays active.        Bronchiectasis    Mild bronchiectasis.  Has seen Dr Madalyn Rob.  Currently doing well.  Breathing stable.        Female bladder prolapse    Still an issues.  Has seen Uro/gyn.  Hold on further w/up.  Follow.        Health care maintenance    Physical today 01/20/15.  Mammogram 01/01/15 - Birads I.  Colonoscopy 2010.  IFOB given.        Pure hypercholesterolemia    Low cholesterol diet and exercise.  On crestor.  Follow lipid panel and liver function tests.        Relevant Orders   Lipid panel   Comprehensive metabolic panel   Stress    Overall feels she is doing better regarding the stress.  Follow.  Does not feel she needs counseling.        Thrombocytosis    Last platelet count wnl.  Recheck cbc to confirm stable.        Relevant Orders   CBC with Differential/Platelet    Other Visit Diagnoses    Colon cancer screening        Relevant Orders    Fecal occult blood, imunochemical    Estrogen deficiency        Relevant Orders    DG Bone Density      I spent 25 minutes with the patient and more than 50% of the time was spent in consultation regarding the above.     Einar Pheasant, MD

## 2015-01-20 NOTE — Progress Notes (Signed)
Pre visit review using our clinic review tool, if applicable. No additional management support is needed unless otherwise documented below in the visit note. 

## 2015-01-21 ENCOUNTER — Encounter: Payer: Self-pay | Admitting: Internal Medicine

## 2015-01-21 DIAGNOSIS — F439 Reaction to severe stress, unspecified: Secondary | ICD-10-CM | POA: Insufficient documentation

## 2015-01-21 DIAGNOSIS — D473 Essential (hemorrhagic) thrombocythemia: Secondary | ICD-10-CM | POA: Insufficient documentation

## 2015-01-21 DIAGNOSIS — D75839 Thrombocytosis, unspecified: Secondary | ICD-10-CM | POA: Insufficient documentation

## 2015-01-21 DIAGNOSIS — Z0000 Encounter for general adult medical examination without abnormal findings: Secondary | ICD-10-CM | POA: Insufficient documentation

## 2015-01-21 NOTE — Assessment & Plan Note (Signed)
Still an issues.  Has seen Uro/gyn.  Hold on further w/up.  Follow.

## 2015-01-21 NOTE — Addendum Note (Signed)
Addended by: Alisa Graff on: 01/21/2015 06:45 AM   Modules accepted: Miquel Dunn

## 2015-01-21 NOTE — Assessment & Plan Note (Signed)
Just saw her cardiologist.  Doing well.  Stays active.

## 2015-01-21 NOTE — Assessment & Plan Note (Signed)
Overall feels she is doing better regarding the stress.  Follow.  Does not feel she needs counseling.

## 2015-01-21 NOTE — Assessment & Plan Note (Signed)
Low cholesterol diet and exercise.  On crestor.  Follow lipid panel and liver function tests.  

## 2015-01-21 NOTE — Assessment & Plan Note (Signed)
Mild bronchiectasis.  Has seen Dr Madalyn Rob.  Currently doing well.  Breathing stable.

## 2015-01-21 NOTE — Assessment & Plan Note (Addendum)
Physical today 01/20/15.  Mammogram 01/01/15 - Birads I.  Colonoscopy 2010.  IFOB given.

## 2015-01-21 NOTE — Assessment & Plan Note (Signed)
Last platelet count wnl.  Recheck cbc to confirm stable.

## 2015-01-22 ENCOUNTER — Other Ambulatory Visit (INDEPENDENT_AMBULATORY_CARE_PROVIDER_SITE_OTHER): Payer: Medicare Other

## 2015-01-22 ENCOUNTER — Encounter: Payer: Self-pay | Admitting: *Deleted

## 2015-01-22 DIAGNOSIS — Z1211 Encounter for screening for malignant neoplasm of colon: Secondary | ICD-10-CM

## 2015-01-22 LAB — FECAL OCCULT BLOOD, IMMUNOCHEMICAL: Fecal Occult Bld: NEGATIVE

## 2015-01-24 ENCOUNTER — Other Ambulatory Visit (INDEPENDENT_AMBULATORY_CARE_PROVIDER_SITE_OTHER): Payer: Medicare Other

## 2015-01-24 DIAGNOSIS — E7800 Pure hypercholesterolemia, unspecified: Secondary | ICD-10-CM

## 2015-01-24 DIAGNOSIS — D473 Essential (hemorrhagic) thrombocythemia: Secondary | ICD-10-CM

## 2015-01-24 DIAGNOSIS — D75839 Thrombocytosis, unspecified: Secondary | ICD-10-CM

## 2015-01-24 LAB — LIPID PANEL
CHOLESTEROL: 144 mg/dL (ref 0–200)
HDL: 60.2 mg/dL (ref >39.00)
LDL Cholesterol: 73 mg/dL (ref 0–99)
NonHDL: 83.8
TRIGLYCERIDES: 52 mg/dL (ref 0.0–149.0)
Total CHOL/HDL Ratio: 2
VLDL: 10.4 mg/dL (ref 0.0–40.0)

## 2015-01-24 LAB — CBC WITH DIFFERENTIAL/PLATELET
Basophils Absolute: 0.1 10*3/uL (ref 0.0–0.1)
Basophils Relative: 1 % (ref 0.0–3.0)
EOS ABS: 0.3 10*3/uL (ref 0.0–0.7)
Eosinophils Relative: 5.7 % — ABNORMAL HIGH (ref 0.0–5.0)
HEMATOCRIT: 37.3 % (ref 36.0–46.0)
Hemoglobin: 12.6 g/dL (ref 12.0–15.0)
Lymphocytes Relative: 17.8 % (ref 12.0–46.0)
Lymphs Abs: 1 10*3/uL (ref 0.7–4.0)
MCHC: 33.8 g/dL (ref 30.0–36.0)
MCV: 90.8 fl (ref 78.0–100.0)
MONO ABS: 0.5 10*3/uL (ref 0.1–1.0)
Monocytes Relative: 9.2 % (ref 3.0–12.0)
Neutro Abs: 3.8 10*3/uL (ref 1.4–7.7)
Neutrophils Relative %: 66.3 % (ref 43.0–77.0)
Platelets: 321 10*3/uL (ref 150.0–400.0)
RBC: 4.11 Mil/uL (ref 3.87–5.11)
RDW: 13.7 % (ref 11.5–15.5)
WBC: 5.8 10*3/uL (ref 4.0–10.5)

## 2015-01-24 LAB — COMPREHENSIVE METABOLIC PANEL
ALK PHOS: 62 U/L (ref 39–117)
ALT: 10 U/L (ref 0–35)
AST: 21 U/L (ref 0–37)
Albumin: 3.9 g/dL (ref 3.5–5.2)
BILIRUBIN TOTAL: 0.5 mg/dL (ref 0.2–1.2)
BUN: 20 mg/dL (ref 6–23)
CO2: 28 mEq/L (ref 19–32)
Calcium: 9.3 mg/dL (ref 8.4–10.5)
Chloride: 108 mEq/L (ref 96–112)
Creatinine, Ser: 0.86 mg/dL (ref 0.40–1.20)
GFR: 68.7 mL/min (ref >60.00)
Glucose, Bld: 87 mg/dL (ref 70–99)
Potassium: 4 mEq/L (ref 3.5–5.1)
Sodium: 142 mEq/L (ref 135–145)
Total Protein: 6.7 g/dL (ref 6.0–8.3)

## 2015-01-27 ENCOUNTER — Encounter: Payer: Self-pay | Admitting: *Deleted

## 2015-02-24 DIAGNOSIS — H6120 Impacted cerumen, unspecified ear: Secondary | ICD-10-CM | POA: Diagnosis not present

## 2015-02-24 DIAGNOSIS — H6060 Unspecified chronic otitis externa, unspecified ear: Secondary | ICD-10-CM | POA: Diagnosis not present

## 2015-02-24 DIAGNOSIS — E2839 Other primary ovarian failure: Secondary | ICD-10-CM | POA: Diagnosis not present

## 2015-02-24 LAB — HM DEXA SCAN

## 2015-02-25 ENCOUNTER — Encounter: Payer: Self-pay | Admitting: *Deleted

## 2015-02-27 ENCOUNTER — Encounter: Payer: Self-pay | Admitting: Internal Medicine

## 2015-03-01 ENCOUNTER — Telehealth: Payer: Self-pay | Admitting: Internal Medicine

## 2015-03-01 NOTE — Telephone Encounter (Signed)
Notify pt that her bone density reveals osteoporosis.  No significant change compared to previous bone density.  If she is interested in starting medication for her bones, then can schedule an appt to discuss treatment.

## 2015-03-03 ENCOUNTER — Encounter: Payer: Self-pay | Admitting: *Deleted

## 2015-03-03 NOTE — Telephone Encounter (Signed)
Mailed letter °

## 2015-03-06 ENCOUNTER — Encounter: Payer: Medicare Other | Admitting: Internal Medicine

## 2015-04-02 DIAGNOSIS — H578 Other specified disorders of eye and adnexa: Secondary | ICD-10-CM | POA: Diagnosis not present

## 2015-05-29 ENCOUNTER — Other Ambulatory Visit: Payer: Self-pay | Admitting: Internal Medicine

## 2015-07-21 ENCOUNTER — Encounter: Payer: Self-pay | Admitting: Internal Medicine

## 2015-07-21 ENCOUNTER — Ambulatory Visit (INDEPENDENT_AMBULATORY_CARE_PROVIDER_SITE_OTHER): Payer: Medicare Other | Admitting: Internal Medicine

## 2015-07-21 VITALS — BP 102/64 | HR 70 | Temp 98.3°F | Ht 64.0 in | Wt 114.1 lb

## 2015-07-21 DIAGNOSIS — F439 Reaction to severe stress, unspecified: Secondary | ICD-10-CM

## 2015-07-21 DIAGNOSIS — E7800 Pure hypercholesterolemia, unspecified: Secondary | ICD-10-CM

## 2015-07-21 DIAGNOSIS — I471 Supraventricular tachycardia: Secondary | ICD-10-CM

## 2015-07-21 DIAGNOSIS — N8110 Cystocele, unspecified: Secondary | ICD-10-CM | POA: Diagnosis not present

## 2015-07-21 DIAGNOSIS — J479 Bronchiectasis, uncomplicated: Secondary | ICD-10-CM | POA: Diagnosis not present

## 2015-07-21 DIAGNOSIS — M25551 Pain in right hip: Secondary | ICD-10-CM

## 2015-07-21 DIAGNOSIS — Z658 Other specified problems related to psychosocial circumstances: Secondary | ICD-10-CM

## 2015-07-21 NOTE — Progress Notes (Signed)
Pre visit review using our clinic review tool, if applicable. No additional management support is needed unless otherwise documented below in the visit note. 

## 2015-07-21 NOTE — Progress Notes (Signed)
Patient ID: DEJAI SCHUBACH, female   DOB: 1941/11/17, 74 y.o.   MRN: [id number]   Subjective:    Patient ID: Jody Howard, female    DOB: [birth year], 74 y.o.   MRN: [id number]  HPI  Patient here for a scheduled follow up.  Staying active.  Has lost weight.  States is exercising more and is more active.  Reports this is her normal weight.  Eating well.  No cardiac symptoms with increased activity or exertion.  Taking crestor 2-3x/week.  No acid reflux.  No cough or congestion. No sob.  Bowels stable.  Occasional right hip discomfort.  Occurs with certain positions.  Does not limit her activity or exercise.     Past Medical History  Diagnosis Date  . Mitral valve prolapse   . Rheumatic fever   . Clostridium difficile infection 1997    required hospitalization  . Anemia   . Hyperlipidemia   . Atrial tachycardia     s/p ablation  . Paroxysmal supraventricular tachycardia     Family history and social history reviewed.     Outpatient Encounter Prescriptions as of 07/21/2015  Medication Sig  . CALCIUM PO Take 1 tablet by mouth daily.   . Cholecalciferol (VITAMIN D-3 PO) Take 1 capsule by mouth daily. Taking 2000 IU daily  . CRESTOR 5 MG tablet TAKE 1/2 TABLET BY MOUTH DAILY.  . Multiple Vitamins-Minerals (MULTIVITAMIN PO) Take 1 tablet by mouth daily.    No facility-administered encounter medications on file as of 07/21/2015.    Review of Systems  Constitutional: Negative for appetite change and unexpected weight change.  HENT: Negative for congestion and sinus pressure.   Eyes: Negative for pain and discharge.  Respiratory: Negative for cough, chest tightness and shortness of breath.   Cardiovascular: Negative for chest pain, palpitations and leg swelling.  Gastrointestinal: Negative for nausea, vomiting, abdominal pain and diarrhea.  Genitourinary: Negative for dysuria and difficulty urinating.  Musculoskeletal: Negative for back pain and joint swelling.       Occasional  right hip pain.    Skin: Negative for color change and rash.  Neurological: Negative for dizziness, light-headedness and headaches.  Hematological: Negative for adenopathy. Does not bruise/bleed easily.  Psychiatric/Behavioral: Negative for dysphoric mood and agitation.       Objective:    Physical Exam  Constitutional: She appears well-developed and well-nourished. No distress.  HENT:  Nose: Nose normal.  Mouth/Throat: Oropharynx is clear and moist.  Eyes: Conjunctivae are normal. Right eye exhibits no discharge. Left eye exhibits no discharge.  Neck: Neck supple. No thyromegaly present.  Cardiovascular: Normal rate and regular rhythm.   Pulmonary/Chest: Breath sounds normal. No respiratory distress. She has no wheezes.  Abdominal: Soft. Bowel sounds are normal. There is no tenderness.  Musculoskeletal: She exhibits no edema or tenderness.  Lymphadenopathy:    She has no cervical adenopathy.  Skin: No rash noted. No erythema.  Psychiatric: She has a normal mood and affect. Her behavior is normal.    BP 102/64 mmHg  Pulse 70  Temp(Src) 98.3 F (36.8 C) (Oral)  Ht 5\' 4"  (1.626 m)  Wt 114 lb 2 oz (51.767 kg)  BMI 19.58 kg/m2  SpO2 97%  LMP 01/29/1983 Wt Readings from Last 3 Encounters:  07/21/15 114 lb 2 oz (51.767 kg)  01/20/15 118 lb 4 oz (53.638 kg)  01/17/15 117 lb (53.071 kg)     Lab Results  Component Value Date   WBC 5.8 01/24/2015   HGB 12.6  01/24/2015   HCT 37.3 01/24/2015   PLT 321.0 01/24/2015   GLUCOSE 87 01/24/2015   CHOL 144 01/24/2015   TRIG 52.0 01/24/2015   HDL 60.20 01/24/2015   LDLCALC 73 01/24/2015   ALT 10 01/24/2015   AST 21 01/24/2015   NA 142 01/24/2015   K 4.0 01/24/2015   CL 108 01/24/2015   CREATININE 0.86 01/24/2015   BUN 20 01/24/2015   CO2 28 01/24/2015   TSH 1.55 03/07/2014       Assessment & Plan:   Problem List Items Addressed This Visit    Atrial tachycardia - Primary    S/p ablation.  Doing well.  Stays active.  No  cardiac symptoms with increased activity or exertion.       Relevant Orders   TSH   Bronchiectasis    Mild bronchiectasis.  Has seen Dr Madalyn Rob.  Doing well.  Breathing well.        Female bladder prolapse    Has seen uro/gyn.  Follow.       Pure hypercholesterolemia    On crestor.  Follow lipid panel and liver function tests.        Relevant Orders   Comprehensive metabolic panel   Lipid panel   Right hip pain    Intermittent right hip pain.  Only occurs when sitting in certain positions.  Desires no further w/up.  Follow.       Stress    Still coping with her husband's death.  Does not feel she needs any further intervention at this time.  Follow.            Einar Pheasant, MD

## 2015-07-23 ENCOUNTER — Encounter: Payer: Self-pay | Admitting: Internal Medicine

## 2015-07-23 DIAGNOSIS — M25559 Pain in unspecified hip: Secondary | ICD-10-CM | POA: Insufficient documentation

## 2015-07-23 DIAGNOSIS — M25551 Pain in right hip: Secondary | ICD-10-CM | POA: Insufficient documentation

## 2015-07-23 NOTE — Assessment & Plan Note (Signed)
On crestor.  Follow lipid panel and liver function tests.   

## 2015-07-23 NOTE — Assessment & Plan Note (Signed)
Still coping with her husband's death.  Does not feel she needs any further intervention at this time.  Follow.

## 2015-07-23 NOTE — Assessment & Plan Note (Signed)
Mild bronchiectasis.  Has seen Dr Madalyn Rob.  Doing well.  Breathing well.

## 2015-07-23 NOTE — Assessment & Plan Note (Signed)
Has seen uro/gyn.  Follow.

## 2015-07-23 NOTE — Assessment & Plan Note (Signed)
S/p ablation.  Doing well.  Stays active.  No cardiac symptoms with increased activity or exertion.

## 2015-07-23 NOTE — Assessment & Plan Note (Signed)
Intermittent right hip pain.  Only occurs when sitting in certain positions.  Desires no further w/up.  Follow.

## 2015-08-01 ENCOUNTER — Ambulatory Visit: Payer: Medicare Other | Admitting: *Deleted

## 2015-08-01 DIAGNOSIS — E7800 Pure hypercholesterolemia, unspecified: Secondary | ICD-10-CM

## 2015-08-01 DIAGNOSIS — Z23 Encounter for immunization: Secondary | ICD-10-CM

## 2015-08-01 DIAGNOSIS — I471 Supraventricular tachycardia: Secondary | ICD-10-CM

## 2015-08-01 LAB — COMPREHENSIVE METABOLIC PANEL
ALBUMIN: 4.1 g/dL (ref 3.5–5.2)
ALK PHOS: 77 U/L (ref 39–117)
ALT: 9 U/L (ref 0–35)
AST: 19 U/L (ref 0–37)
BUN: 17 mg/dL (ref 6–23)
CALCIUM: 9.8 mg/dL (ref 8.4–10.5)
CO2: 26 mEq/L (ref 19–32)
CREATININE: 1 mg/dL (ref 0.40–1.20)
Chloride: 105 mEq/L (ref 96–112)
GFR: 57.64 mL/min — ABNORMAL LOW (ref >60.00)
Glucose, Bld: 84 mg/dL (ref 70–99)
Potassium: 4.9 mEq/L (ref 3.5–5.1)
SODIUM: 141 meq/L (ref 135–145)
TOTAL PROTEIN: 6.7 g/dL (ref 6.0–8.3)
Total Bilirubin: 0.5 mg/dL (ref 0.2–1.2)

## 2015-08-01 LAB — LIPID PANEL
CHOLESTEROL: 155 mg/dL (ref 0–200)
HDL: 55.6 mg/dL (ref >39.00)
LDL Cholesterol: 88 mg/dL (ref 0–99)
NonHDL: 99.84
TRIGLYCERIDES: 57 mg/dL (ref 0.0–149.0)
Total CHOL/HDL Ratio: 3
VLDL: 11.4 mg/dL (ref 0.0–40.0)

## 2015-08-01 LAB — TSH: TSH: 1.02 u[IU]/mL (ref 0.35–4.50)

## 2015-08-04 ENCOUNTER — Encounter: Payer: Self-pay | Admitting: *Deleted

## 2015-08-13 ENCOUNTER — Ambulatory Visit: Payer: Medicare Other

## 2016-01-05 DIAGNOSIS — Z853 Personal history of malignant neoplasm of breast: Secondary | ICD-10-CM | POA: Diagnosis not present

## 2016-01-05 DIAGNOSIS — Z1231 Encounter for screening mammogram for malignant neoplasm of breast: Secondary | ICD-10-CM | POA: Diagnosis not present

## 2016-01-05 LAB — HM MAMMOGRAPHY

## 2016-01-06 ENCOUNTER — Encounter: Payer: Self-pay | Admitting: Internal Medicine

## 2016-01-23 ENCOUNTER — Ambulatory Visit: Payer: Medicare Other | Admitting: Internal Medicine

## 2016-01-25 DIAGNOSIS — J069 Acute upper respiratory infection, unspecified: Secondary | ICD-10-CM | POA: Diagnosis not present

## 2016-02-09 DIAGNOSIS — D485 Neoplasm of uncertain behavior of skin: Secondary | ICD-10-CM | POA: Diagnosis not present

## 2016-02-09 DIAGNOSIS — D2272 Melanocytic nevi of left lower limb, including hip: Secondary | ICD-10-CM | POA: Diagnosis not present

## 2016-02-09 DIAGNOSIS — L821 Other seborrheic keratosis: Secondary | ICD-10-CM | POA: Diagnosis not present

## 2016-02-09 DIAGNOSIS — L814 Other melanin hyperpigmentation: Secondary | ICD-10-CM | POA: Diagnosis not present

## 2016-02-09 DIAGNOSIS — D225 Melanocytic nevi of trunk: Secondary | ICD-10-CM | POA: Diagnosis not present

## 2016-03-02 ENCOUNTER — Other Ambulatory Visit: Payer: Self-pay | Admitting: Internal Medicine

## 2016-03-02 ENCOUNTER — Ambulatory Visit (INDEPENDENT_AMBULATORY_CARE_PROVIDER_SITE_OTHER): Payer: Medicare Other

## 2016-03-02 VITALS — BP 100/62 | HR 64 | Temp 98.1°F | Resp 12 | Ht 64.0 in | Wt 114.4 lb

## 2016-03-02 DIAGNOSIS — E785 Hyperlipidemia, unspecified: Secondary | ICD-10-CM | POA: Diagnosis not present

## 2016-03-02 DIAGNOSIS — Z0000 Encounter for general adult medical examination without abnormal findings: Secondary | ICD-10-CM | POA: Diagnosis not present

## 2016-03-02 DIAGNOSIS — Z760 Encounter for issue of repeat prescription: Secondary | ICD-10-CM | POA: Diagnosis not present

## 2016-03-02 DIAGNOSIS — Z23 Encounter for immunization: Secondary | ICD-10-CM | POA: Diagnosis not present

## 2016-03-02 NOTE — Progress Notes (Signed)
Subjective:   Jody Howard is a 75 y.o. female who presents for an Initial Medicare Annual Wellness Visit.  Review of Systems    No ROS.  Medicare Wellness Visit.  Cardiac Risk Factors include: advanced age (>61mn, >>60women)     Objective:    Today's Vitals   03/02/16 1016  BP: 100/62  Pulse: 64  Temp: 98.1 F (36.7 C)  TempSrc: Oral  Resp: 12  Height: 5' 4"  (1.626 m)  Weight: 114 lb 6.4 oz (51.891 kg)  SpO2: 98%   Body mass index is 19.63 kg/(m^2).   Current Medications (verified) Outpatient Encounter Prescriptions as of 03/02/2016  Medication Sig  . CALCIUM PO Take 1 tablet by mouth daily.   . Cholecalciferol (VITAMIN D-3 PO) Take 1 capsule by mouth daily. Taking 2000 IU daily  . CRESTOR 5 MG tablet TAKE 1/2 TABLET BY MOUTH DAILY.  . Multiple Vitamins-Minerals (EYE VITAMINS) CAPS Take 1 capsule by mouth.  . Multiple Vitamins-Minerals (MULTIVITAMIN PO) Take 1 tablet by mouth daily.    No facility-administered encounter medications on file as of 03/02/2016.    Allergies (verified) Codeine; Lipitor; Simvastatin; and Flagyl   History: Past Medical History  Diagnosis Date  . Mitral valve prolapse   . Rheumatic fever   . Clostridium difficile infection 1997    required hospitalization  . Anemia   . Hyperlipidemia   . Atrial tachycardia (HCC)     s/p ablation  . Paroxysmal supraventricular tachycardia ([facility]    Past Surgical History  Procedure Laterality Date  . Breast biopsy  1997  . Tonsillectomy  1944  . Ablation      for atrial tachycardia   Family History  Problem Relation Age of Onset  . Cancer Mother     hx of breast cancer  . Cancer Paternal Aunt     colon cancer  . Multiple myeloma Father   . Breast cancer Sister    Social History   Occupational History  . Not on file.   Social History Main Topics  . Smoking status: Never Smoker   . Smokeless tobacco: Never Used  . Alcohol Use: 0.0 oz/week    0 Standard drinks or equivalent per  week     Comment: occasional  . Drug Use: No  . Sexual Activity: No    Tobacco Counseling Counseling given: Not Answered   Activities of Daily Living In your present state of health, do you have any difficulty performing the following activities: 03/02/2016  Hearing? N  Vision? N  Difficulty concentrating or making decisions? N  Walking or climbing stairs? N  Dressing or bathing? N  Doing errands, shopping? N  Preparing Food and eating ? N  Using the Toilet? N  In the past six months, have you accidently leaked urine? N  Do you have problems with loss of bowel control? N  Managing your Medications? N  Managing your Finances? N  Housekeeping or managing your Housekeeping? N    Immunizations and Health Maintenance Immunization History  Administered Date(s) Administered  . Influenza Split 09/10/2013  . Influenza, High Dose Seasonal PF 10/09/2014  . Influenza,inj,Quad PF,36+ Mos 08/01/2015   Health Maintenance Due  Topic Date Due  . TETANUS/TDAP  10/20/1960  . ZOSTAVAX  10/20/2001  . PNA vac Low Risk Adult (1 of 2 - PCV13) 10/20/2006    Patient Care Team: CEinar Pheasant MD as PCP - General (Internal Medicine)  Indicate any recent Medical Services you may have received from  other than [facility] providers in the past year (date may be approximate).     Assessment:   This is a routine wellness examination for Jody Howard. The goal of the wellness visit is to assist the patient how to close the gaps in care and create a preventative care plan for the patient.   Taking VIT D3 Calcium as appropriate/Osteoporosis risk reviewed.  Medications reviewed; taking without issues or barriers.  Safety issues reviewed; smoke detectors in the home.  No firearms in the home. Wears seatbelts when driving or riding with others. No violence in the home.  No identified risk were noted; The patient was oriented x 3; appropriate in dress and manner and no objective failures at ADL's or IADL's.     ZOSTAVAX, TDAP, PCV 13/23 vaccines postponed for follow up with local pharmacy; she is unsure as to which vaccines were previously administered and will return with updated information prior to upcoming CPE.     Supraventricular tac-stable and followed by PCP and Dr. Tamala Julian, [facility] [place]ox atrial tachycardia-stable and followed by PCP and Dr. Tamala Julian, [place] thrombocythemia-stable and followed by PCP Essential (hemorrha)-stable and followed by PCP DVT-stable and followed by PCP Bronchiectas, w/o ac-stable and followed by PCP  Patient Concerns:  Refill of Crestor; fasting labs ordered 2 weeks prior to upcoming physical.  Hearing/Vision screen Hearing Screening Comments: Passes the whisper test Vision Screening Comments: Followed by [facility] Wears glasses Annual visits   Dietary issues and exercise activities discussed: Current Exercise Habits: Home exercise routine, Type of exercise: walking;stretching, Frequency (Times/Week): 4, Intensity: Intense, Exercise limited by: Other - see comments (Standing leg lift exercises 30-50 per leg)  Goals    . Healthy Lifestyle     Stay hydrated and drink plenty of fluids. Low carb foods.  Lean meats, fruits and vegetables. Maintain home exercise regiment and walking.      Depression Screen PHQ 2/9 Scores 03/02/2016 01/20/2015 06/06/2013  PHQ - 2 Score 0 0 0    Fall Risk Fall Risk  03/02/2016 01/20/2015 06/06/2013 02/11/2013  Falls in the past year? No No No No    Cognitive Function: MMSE - Mini Mental State Exam 03/02/2016  Orientation to time 5  Orientation to Place 5  Registration 3  Attention/ Calculation 5  Recall 3  Language- name 2 objects 2  Language- repeat 1  Language- follow 3 step command 3  Language- read & follow direction 1  Write a sentence 1  Copy design 1  Total score 30    Screening Tests Health Maintenance  Topic Date Due  . TETANUS/TDAP  10/20/1960  . ZOSTAVAX  10/20/2001  . PNA vac  Low Risk Adult (1 of 2 - PCV13) 10/20/2006  . INFLUENZA VACCINE  07/06/2016  . MAMMOGRAM  01/04/2017  . COLONOSCOPY  01/10/2019  . DEXA SCAN  Completed      Plan:   End of life planning; Advance aging; Advanced directives discussed. Copy of current HCPOA/Living Will requested.    During the course of the visit, Jody Howard was educated and counseled about the following appropriate screening and preventive services:   Vaccines to include Pneumoccal, Influenza, Hepatitis B, Td, Zostavax, HCV  Electrocardiogram  Cardiovascular disease screening  Colorectal cancer screening  Bone density screening  Diabetes screening  Glaucoma screening  Mammography/PAP  Nutrition counseling  Smoking cessation counseling  Patient Instructions (the written plan) were given to the patient.    Varney Biles, LPN   6/96/2952    Reviewed  above information.  Agree with plan.    Dr Nicki Reaper

## 2016-03-02 NOTE — Patient Instructions (Addendum)
Jody Howard , Thank you for taking time to come for your Medicare Wellness Visit. I appreciate your ongoing commitment to your health goals. Please review the following plan we discussed and let me know if I can assist you in the future.   Follow up with Dr. Nicki Reaper as needed.   This is a list of the screening recommended for you and due dates:  Health Maintenance  Topic Date Due  . Tetanus Vaccine  10/20/1960  . Shingles Vaccine  10/20/2001  . Pneumonia vaccines (1 of 2 - PCV13) 10/20/2006  . Flu Shot  07/06/2016  . Mammogram  01/04/2017  . Colon Cancer Screening  01/10/2019  . DEXA scan (bone density measurement)  Completed    Health Maintenance, Female Adopting a healthy lifestyle and getting preventive care can go a long way to promote health and wellness. Talk with your health care provider about what schedule of regular examinations is right for you. This is a good chance for you to check in with your provider about disease prevention and staying healthy. In between checkups, there are plenty of things you can do on your own. Experts have done a lot of research about which lifestyle changes and preventive measures are most likely to keep you healthy. Ask your health care provider for more information. WEIGHT AND DIET  Eat a healthy diet  Be sure to include plenty of vegetables, fruits, low-fat dairy products, and lean protein.  Do not eat a lot of foods high in solid fats, added sugars, or salt.  Get regular exercise. This is one of the most important things you can do for your health.  Most adults should exercise for at least 150 minutes each week. The exercise should increase your heart rate and make you sweat (moderate-intensity exercise).  Most adults should also do strengthening exercises at least twice a week. This is in addition to the moderate-intensity exercise.  Maintain a healthy weight  Body mass index (BMI) is a measurement that can be used to identify possible  weight problems. It estimates body fat based on height and weight. Your health care provider can help determine your BMI and help you achieve or maintain a healthy weight.  For females 56 years of age and older:   A BMI below 18.5 is considered underweight.  A BMI of 18.5 to 24.9 is normal.  A BMI of 25 to 29.9 is considered overweight.  A BMI of 30 and above is considered obese.  Watch levels of cholesterol and blood lipids  You should start having your blood tested for lipids and cholesterol at 75 years of age, then have this test every 5 years.  You may need to have your cholesterol levels checked more often if:  Your lipid or cholesterol levels are high.  You are older than 75 years of age.  You are at high risk for heart disease.  CANCER SCREENING   Lung Cancer  Lung cancer screening is recommended for adults 24-32 years old who are at high risk for lung cancer because of a history of smoking.  A yearly low-dose CT scan of the lungs is recommended for people who:  Currently smoke.  Have quit within the past 15 years.  Have at least a 30-pack-year history of smoking. A pack year is smoking an average of one pack of cigarettes a day for 1 year.  Yearly screening should continue until it has been 15 years since you quit.  Yearly screening should stop if you  develop a health problem that would prevent you from having lung cancer treatment.  Breast Cancer  Practice breast self-awareness. This means understanding how your breasts normally appear and feel.  It also means doing regular breast self-exams. Let your health care provider know about any changes, no matter how small.  If you are in your 20s or 30s, you should have a clinical breast exam (CBE) by a health care provider every 1-3 years as part of a regular health exam.  If you are 41 or older, have a CBE every year. Also consider having a breast X-ray (mammogram) every year.  If you have a family history of  breast cancer, talk to your health care provider about genetic screening.  If you are at high risk for breast cancer, talk to your health care provider about having an MRI and a mammogram every year.  Breast cancer gene (BRCA) assessment is recommended for women who have family members with BRCA-related cancers. BRCA-related cancers include:  Breast.  Ovarian.  Tubal.  Peritoneal cancers.  Results of the assessment will determine the need for genetic counseling and BRCA1 and BRCA2 testing. Cervical Cancer Your health care provider may recommend that you be screened regularly for cancer of the pelvic organs (ovaries, uterus, and vagina). This screening involves a pelvic examination, including checking for microscopic changes to the surface of your cervix (Pap test). You may be encouraged to have this screening done every 3 years, beginning at age 11.  For women ages 41-65, health care providers may recommend pelvic exams and Pap testing every 3 years, or they may recommend the Pap and pelvic exam, combined with testing for human papilloma virus (HPV), every 5 years. Some types of HPV increase your risk of cervical cancer. Testing for HPV may also be done on women of any age with unclear Pap test results.  Other health care providers may not recommend any screening for nonpregnant women who are considered low risk for pelvic cancer and who do not have symptoms. Ask your health care provider if a screening pelvic exam is right for you.  If you have had past treatment for cervical cancer or a condition that could lead to cancer, you need Pap tests and screening for cancer for at least 20 years after your treatment. If Pap tests have been discontinued, your risk factors (such as having a new sexual partner) need to be reassessed to determine if screening should resume. Some women have medical problems that increase the chance of getting cervical cancer. In these cases, your health care provider may  recommend more frequent screening and Pap tests. Colorectal Cancer  This type of cancer can be detected and often prevented.  Routine colorectal cancer screening usually begins at 75 years of age and continues through 75 years of age.  Your health care provider may recommend screening at an earlier age if you have risk factors for colon cancer.  Your health care provider may also recommend using home test kits to check for hidden blood in the stool.  A small camera at the end of a tube can be used to examine your colon directly (sigmoidoscopy or colonoscopy). This is done to check for the earliest forms of colorectal cancer.  Routine screening usually begins at age 24.  Direct examination of the colon should be repeated every 5-10 years through 75 years of age. However, you may need to be screened more often if early forms of precancerous polyps or small growths are found. Skin Cancer  Check your skin from head to toe regularly.  Tell your health care provider about any new moles or changes in moles, especially if there is a change in a mole's shape or color.  Also tell your health care provider if you have a mole that is larger than the size of a pencil eraser.  Always use sunscreen. Apply sunscreen liberally and repeatedly throughout the day.  Protect yourself by wearing long sleeves, pants, a wide-brimmed hat, and sunglasses whenever you are outside. HEART DISEASE, DIABETES, AND HIGH BLOOD PRESSURE   High blood pressure causes heart disease and increases the risk of stroke. High blood pressure is more likely to develop in:  People who have blood pressure in the high end of the normal range (130-139/85-89 mm Hg).  People who are overweight or obese.  People who are African American.  If you are 64-54 years of age, have your blood pressure checked every 3-5 years. If you are 36 years of age or older, have your blood pressure checked every year. You should have your blood  pressure measured twice--once when you are at a hospital or clinic, and once when you are not at a hospital or clinic. Record the average of the two measurements. To check your blood pressure when you are not at a hospital or clinic, you can use:  An automated blood pressure machine at a pharmacy.  A home blood pressure monitor.  If you are between 53 years and 90 years old, ask your health care provider if you should take aspirin to prevent strokes.  Have regular diabetes screenings. This involves taking a blood sample to check your fasting blood sugar level.  If you are at a normal weight and have a low risk for diabetes, have this test once every three years after 75 years of age.  If you are overweight and have a high risk for diabetes, consider being tested at a younger age or more often. PREVENTING INFECTION  Hepatitis B  If you have a higher risk for hepatitis B, you should be screened for this virus. You are considered at high risk for hepatitis B if:  You were born in a country where hepatitis B is common. Ask your health care provider which countries are considered high risk.  Your parents were born in a high-risk country, and you have not been immunized against hepatitis B (hepatitis B vaccine).  You have HIV or AIDS.  You use needles to inject street drugs.  You live with someone who has hepatitis B.  You have had sex with someone who has hepatitis B.  You get hemodialysis treatment.  You take certain medicines for conditions, including cancer, organ transplantation, and autoimmune conditions. Hepatitis C  Blood testing is recommended for:  Everyone born from [birth year] through 1965.  Anyone with known risk factors for hepatitis C. Sexually transmitted infections (STIs)  You should be screened for sexually transmitted infections (STIs) including gonorrhea and chlamydia if:  You are sexually active and are younger than 75 years of age.  You are older than 75 years  of age and your health care provider tells you that you are at risk for this type of infection.  Your sexual activity has changed since you were last screened and you are at an increased risk for chlamydia or gonorrhea. Ask your health care provider if you are at risk.  If you do not have HIV, but are at risk, it may be recommended that you take a prescription medicine  daily to prevent HIV infection. This is called pre-exposure prophylaxis (PrEP). You are considered at risk if:  You are sexually active and do not regularly use condoms or know the HIV status of your partner(s).  You take drugs by injection.  You are sexually active with a partner who has HIV. Talk with your health care provider about whether you are at high risk of being infected with HIV. If you choose to begin PrEP, you should first be tested for HIV. You should then be tested every 3 months for as long as you are taking PrEP.  PREGNANCY   If you are premenopausal and you may become pregnant, ask your health care provider about preconception counseling.  If you may become pregnant, take 400 to 800 micrograms (mcg) of folic acid every day.  If you want to prevent pregnancy, talk to your health care provider about birth control (contraception). OSTEOPOROSIS AND MENOPAUSE   Osteoporosis is a disease in which the bones lose minerals and strength with aging. This can result in serious bone fractures. Your risk for osteoporosis can be identified using a bone density scan.  If you are 23 years of age or older, or if you are at risk for osteoporosis and fractures, ask your health care provider if you should be screened.  Ask your health care provider whether you should take a calcium or vitamin D supplement to lower your risk for osteoporosis.  Menopause may have certain physical symptoms and risks.  Hormone replacement therapy may reduce some of these symptoms and risks. Talk to your health care provider about whether hormone  replacement therapy is right for you.  HOME CARE INSTRUCTIONS   Schedule regular health, dental, and eye exams.  Stay current with your immunizations.   Do not use any tobacco products including cigarettes, chewing tobacco, or electronic cigarettes.  If you are pregnant, do not drink alcohol.  If you are breastfeeding, limit how much and how often you drink alcohol.  Limit alcohol intake to no more than 1 drink per day for nonpregnant women. One drink equals 12 ounces of beer, 5 ounces of wine, or 1 ounces of hard liquor.  Do not use street drugs.  Do not share needles.  Ask your health care provider for help if you need support or information about quitting drugs.  Tell your health care provider if you often feel depressed.  Tell your health care provider if you have ever been abused or do not feel safe at home.   This information is not intended to replace advice given to you by your health care provider. Make sure you discuss any questions you have with your health care provider.   Document Released: 06/07/2011 Document Revised: 12/13/2014 Document Reviewed: 10/24/2013 Elsevier Interactive Patient Education [employer].

## 2016-03-03 MED ORDER — ROSUVASTATIN CALCIUM 5 MG PO TABS: 2.5000 mg | ORAL_TABLET | Freq: Every day | ORAL | Status: DC

## 2016-03-29 ENCOUNTER — Other Ambulatory Visit: Payer: Medicare Other

## 2016-03-30 ENCOUNTER — Other Ambulatory Visit (INDEPENDENT_AMBULATORY_CARE_PROVIDER_SITE_OTHER): Payer: Medicare Other

## 2016-03-30 DIAGNOSIS — Z0000 Encounter for general adult medical examination without abnormal findings: Secondary | ICD-10-CM | POA: Diagnosis not present

## 2016-03-30 DIAGNOSIS — E785 Hyperlipidemia, unspecified: Secondary | ICD-10-CM | POA: Diagnosis not present

## 2016-03-30 LAB — CBC WITH DIFFERENTIAL/PLATELET
BASOS ABS: 0 10*3/uL (ref 0.0–0.1)
Basophils Relative: 0.5 % (ref 0.0–3.0)
Eosinophils Absolute: 0.1 10*3/uL (ref 0.0–0.7)
Eosinophils Relative: 2.5 % (ref 0.0–5.0)
HCT: 37.4 % (ref 36.0–46.0)
Hemoglobin: 12.3 g/dL (ref 12.0–15.0)
LYMPHS ABS: 1.2 10*3/uL (ref 0.7–4.0)
Lymphocytes Relative: 21.1 % (ref 12.0–46.0)
MCHC: 33 g/dL (ref 30.0–36.0)
MCV: 93.1 fl (ref 78.0–100.0)
MONO ABS: 0.5 10*3/uL (ref 0.1–1.0)
MONOS PCT: 8.7 % (ref 3.0–12.0)
NEUTROS ABS: 3.8 10*3/uL (ref 1.4–7.7)
NEUTROS PCT: 67.2 % (ref 43.0–77.0)
PLATELETS: 374 10*3/uL (ref 150.0–400.0)
RBC: 4.02 Mil/uL (ref 3.87–5.11)
RDW: 14.2 % (ref 11.5–15.5)
WBC: 5.7 10*3/uL (ref 4.0–10.5)

## 2016-03-30 LAB — COMPREHENSIVE METABOLIC PANEL
ALK PHOS: 65 U/L (ref 39–117)
ALT: 15 U/L (ref 0–35)
AST: 24 U/L (ref 0–37)
Albumin: 3.9 g/dL (ref 3.5–5.2)
BILIRUBIN TOTAL: 0.5 mg/dL (ref 0.2–1.2)
BUN: 22 mg/dL (ref 6–23)
CO2: 32 meq/L (ref 19–32)
CREATININE: 0.85 mg/dL (ref 0.40–1.20)
Calcium: 9.6 mg/dL (ref 8.4–10.5)
Chloride: 103 mEq/L (ref 96–112)
GFR: 69.4 mL/min (ref >60.00)
GLUCOSE: 87 mg/dL (ref 70–99)
POTASSIUM: 4.2 meq/L (ref 3.5–5.1)
SODIUM: 140 meq/L (ref 135–145)
TOTAL PROTEIN: 6.6 g/dL (ref 6.0–8.3)

## 2016-03-30 LAB — LIPID PANEL
CHOL/HDL RATIO: 2
Cholesterol: 146 mg/dL (ref 0–200)
HDL: 59.5 mg/dL (ref >39.00)
LDL CALC: 72 mg/dL (ref 0–99)
NONHDL: 86.46
TRIGLYCERIDES: 72 mg/dL (ref 0.0–149.0)
VLDL: 14.4 mg/dL (ref 0.0–40.0)

## 2016-03-31 ENCOUNTER — Encounter: Payer: Self-pay | Admitting: *Deleted

## 2016-04-12 ENCOUNTER — Ambulatory Visit: Payer: Medicare Other | Admitting: Internal Medicine

## 2016-04-20 ENCOUNTER — Ambulatory Visit (INDEPENDENT_AMBULATORY_CARE_PROVIDER_SITE_OTHER): Payer: Medicare Other | Admitting: Internal Medicine

## 2016-04-20 ENCOUNTER — Encounter: Payer: Self-pay | Admitting: Internal Medicine

## 2016-04-20 VITALS — BP 110/60 | HR 66 | Temp 98.2°F | Resp 18 | Ht 64.0 in | Wt 115.2 lb

## 2016-04-20 DIAGNOSIS — J479 Bronchiectasis, uncomplicated: Secondary | ICD-10-CM

## 2016-04-20 DIAGNOSIS — F439 Reaction to severe stress, unspecified: Secondary | ICD-10-CM

## 2016-04-20 DIAGNOSIS — Z658 Other specified problems related to psychosocial circumstances: Secondary | ICD-10-CM | POA: Diagnosis not present

## 2016-04-20 DIAGNOSIS — I471 Supraventricular tachycardia: Secondary | ICD-10-CM | POA: Diagnosis not present

## 2016-04-20 DIAGNOSIS — Z0000 Encounter for general adult medical examination without abnormal findings: Secondary | ICD-10-CM

## 2016-04-20 DIAGNOSIS — E7800 Pure hypercholesterolemia, unspecified: Secondary | ICD-10-CM

## 2016-04-20 NOTE — Assessment & Plan Note (Addendum)
Physical today 04/20/16.  Mammogram 01/05/16 - Birads II.  Colonoscopy 2010.  IFOB given.

## 2016-04-20 NOTE — Progress Notes (Signed)
Pre-visit discussion using our clinic review tool. No additional management support is needed unless otherwise documented below in the visit note.  

## 2016-04-20 NOTE — Progress Notes (Signed)
Patient ID: Jody Howard, female   DOB: 25-Jan-1941, 75 y.o.   MRN: [id number]   Subjective:    Patient ID: Jody Howard, female    DOB: 04/21/1941, 75 y.o.   MRN: [id number]  HPI  Patient with past history of hypercholesterolemia and atrial tachycardia s/p ablation.  She comes in today to follow up on these issues.  She is staying active.  Is physically active. No chest pain.  No sob.  No acid reflux.  No increased heart rate or palpitations.  No abdominal pain or cramping.  Bowels stable.  Overall feels she is feeling better.  Still with increased stress.  Declines any further intervention.     Past Medical History  Diagnosis Date  . Mitral valve prolapse   . Rheumatic fever   . Clostridium difficile infection 1997    required hospitalization  . Anemia   . Hyperlipidemia   . Atrial tachycardia (HCC)     s/p ablation  . Paroxysmal supraventricular tachycardia [facility])    Past Surgical History  Procedure Laterality Date  . Breast biopsy  1997  . Tonsillectomy  1944  . Ablation      for atrial tachycardia   Family History  Problem Relation Age of Onset  . Cancer Mother     hx of breast cancer  . Cancer Paternal Aunt     colon cancer  . Multiple myeloma Father   . Breast cancer Sister    Social History   Social History  . Marital Status: Widowed    Spouse Name: N/A  . Number of Children: 2  . Years of Education: N/A   Social History Main Topics  . Smoking status: Never Smoker   . Smokeless tobacco: Never Used  . Alcohol Use: 0.0 oz/week    0 Standard drinks or equivalent per week     Comment: occasional  . Drug Use: No  . Sexual Activity: No   Other Topics Concern  . None   Social History Narrative    Outpatient Encounter Prescriptions as of 04/20/2016  Medication Sig  . CALCIUM PO Take 1 tablet by mouth daily.   . Cholecalciferol (VITAMIN D-3 PO) Take 1 capsule by mouth daily. Taking 2000 IU daily  . Multiple Vitamins-Minerals (MULTIVITAMIN PO) Take 1  tablet by mouth daily.   . rosuvastatin (CRESTOR) 5 MG tablet Take 0.5 tablets (2.5 mg total) by mouth daily.  . [DISCONTINUED] CRESTOR 5 MG tablet TAKE 1/2 TABLET BY MOUTH DAILY  . [DISCONTINUED] Multiple Vitamins-Minerals (EYE VITAMINS) CAPS Take 1 capsule by mouth.   No facility-administered encounter medications on file as of 04/20/2016.    Review of Systems  Constitutional: Negative for appetite change and unexpected weight change.  HENT: Negative for congestion and sinus pressure.   Eyes: Negative for pain and visual disturbance.  Respiratory: Negative for cough, chest tightness and shortness of breath.   Cardiovascular: Negative for chest pain, palpitations and leg swelling.  Gastrointestinal: Negative for nausea, vomiting, abdominal pain and diarrhea.  Genitourinary: Negative for dysuria and difficulty urinating.  Musculoskeletal: Negative for back pain and joint swelling.  Skin: Negative for color change and rash.  Neurological: Negative for dizziness, light-headedness and headaches.  Hematological: Negative for adenopathy. Does not bruise/bleed easily.  Psychiatric/Behavioral: Negative for dysphoric mood and agitation.       Objective:     Blood pressure rechecked by me:  122/62  Physical Exam  Constitutional: She is oriented to person, place, and time. She appears well-developed  and well-nourished. No distress.  HENT:  Nose: Nose normal.  Mouth/Throat: Oropharynx is clear and moist.  Eyes: Right eye exhibits no discharge. Left eye exhibits no discharge. No scleral icterus.  Neck: Neck supple. No thyromegaly present.  Cardiovascular: Normal rate and regular rhythm.   Pulmonary/Chest: Breath sounds normal. No accessory muscle usage. No tachypnea. No respiratory distress. She has no decreased breath sounds. She has no wheezes. She has no rhonchi. Right breast exhibits no inverted nipple, no mass, no nipple discharge and no tenderness (no axillary adenopathy). Left breast  exhibits no inverted nipple, no mass, no nipple discharge and no tenderness (no axilarry adenopathy).  Abdominal: Soft. Bowel sounds are normal. There is no tenderness.  Musculoskeletal: She exhibits no edema or tenderness.  Lymphadenopathy:    She has no cervical adenopathy.  Neurological: She is alert and oriented to person, place, and time.  Skin: Skin is warm. No rash noted. No erythema.  Psychiatric: She has a normal mood and affect. Her behavior is normal.    BP 110/60 mmHg  Pulse 66  Temp(Src) 98.2 F (36.8 C) (Oral)  Resp 18  Ht 5' 4"  (1.626 m)  Wt 115 lb 4 oz (52.277 kg)  BMI 19.77 kg/m2  SpO2 98%  LMP 01/29/1983 Wt Readings from Last 3 Encounters:  04/20/16 115 lb 4 oz (52.277 kg)  03/02/16 114 lb 6.4 oz (51.891 kg)  07/21/15 114 lb 2 oz (51.767 kg)     Lab Results  Component Value Date   WBC 5.7 03/30/2016   HGB 12.3 03/30/2016   HCT 37.4 03/30/2016   PLT 374.0 03/30/2016   GLUCOSE 87 03/30/2016   CHOL 146 03/30/2016   TRIG 72.0 03/30/2016   HDL 59.50 03/30/2016   LDLCALC 72 03/30/2016   ALT 15 03/30/2016   AST 24 03/30/2016   NA 140 03/30/2016   K 4.2 03/30/2016   CL 103 03/30/2016   CREATININE 0.85 03/30/2016   BUN 22 03/30/2016   CO2 32 03/30/2016   TSH 1.02 08/01/2015       Assessment & Plan:   Problem List Items Addressed This Visit    Atrial tachycardia ([place])    S/p ablation.  Has done well.  Currently asymptomatic.        Relevant Orders   TSH   Bronchiectasis (HCC)    Mild bronchiectasis.  Has seen Dr Madalyn Rob.  Doing well.  No sob.       Health care maintenance - Primary    Physical today 04/20/16.  Mammogram 01/05/16 - Birads II.  Colonoscopy 2010.  IFOB given.       Pure hypercholesterolemia    On crestor.  Follow lipid panel and liver function tests.   Lab Results  Component Value Date   CHOL 146 03/30/2016   HDL 59.50 03/30/2016   LDLCALC 72 03/30/2016   TRIG 72.0 03/30/2016   CHOLHDL 2 03/30/2016        Relevant  Orders   Lipid panel   Comprehensive metabolic panel   Stress    Increased stress as outlined.  Does not feel needs anything more at this point.  Follow.           Einar Pheasant, MD

## 2016-04-27 ENCOUNTER — Other Ambulatory Visit: Payer: Medicare Other

## 2016-04-28 ENCOUNTER — Other Ambulatory Visit: Payer: Self-pay | Admitting: *Deleted

## 2016-04-28 ENCOUNTER — Other Ambulatory Visit (INDEPENDENT_AMBULATORY_CARE_PROVIDER_SITE_OTHER): Payer: Medicare Other

## 2016-04-28 DIAGNOSIS — Z1211 Encounter for screening for malignant neoplasm of colon: Secondary | ICD-10-CM | POA: Diagnosis not present

## 2016-04-28 LAB — FECAL OCCULT BLOOD, IMMUNOCHEMICAL: Fecal Occult Bld: NEGATIVE

## 2016-04-29 ENCOUNTER — Encounter: Payer: Self-pay | Admitting: *Deleted

## 2016-05-02 ENCOUNTER — Encounter: Payer: Self-pay | Admitting: Internal Medicine

## 2016-05-02 NOTE — Assessment & Plan Note (Signed)
Increased stress as outlined.  Does not feel needs anything more at this point.  Follow.

## 2016-05-02 NOTE — Assessment & Plan Note (Signed)
Mild bronchiectasis.  Has seen Dr Madalyn Rob.  Doing well.  No sob.

## 2016-05-02 NOTE — Assessment & Plan Note (Signed)
S/p ablation.  Has done well.  Currently asymptomatic.

## 2016-05-02 NOTE — Assessment & Plan Note (Signed)
On crestor.  Follow lipid panel and liver function tests.   Lab Results  Component Value Date   CHOL 146 03/30/2016   HDL 59.50 03/30/2016   LDLCALC 72 03/30/2016   TRIG 72.0 03/30/2016   CHOLHDL 2 03/30/2016

## 2016-06-17 ENCOUNTER — Telehealth: Payer: Self-pay | Admitting: Internal Medicine

## 2016-06-17 DIAGNOSIS — Z1239 Encounter for other screening for malignant neoplasm of breast: Secondary | ICD-10-CM

## 2016-06-17 NOTE — Telephone Encounter (Signed)
I can refer her to the cancer center for further testing - genetic testing and advice on more aggressive screening.  If wants me to do this let me know and I will place the order for the referral.

## 2016-06-17 NOTE — Telephone Encounter (Signed)
Order placed for oncology referral

## 2016-06-17 NOTE — Telephone Encounter (Signed)
Patient had mammogram completed on 12/26/15.  She is concerned as sisters diagnosis was not known till it had already spread.  Please advise on options for patient to be checked thoroughly.  thanks

## 2016-06-17 NOTE — Telephone Encounter (Signed)
Pt would like  to have a medical mammogram do due to her sister being diagnosed with breast cancer and brain stem cancer that has been missed til recently.. Please advise pt on what would be the next steps that she needs to follow.Jody Howard

## 2016-06-17 NOTE — Telephone Encounter (Signed)
Spoke with the patient, she agreed to referral to the cancer center.  She has had two cousins come up recently with breast cancer and another that has had two different types diagnosis ed this year.  She is very happy to get all the information as possible. thanks

## 2016-06-18 NOTE — Telephone Encounter (Signed)
Notified patient referral in place.

## 2016-06-23 NOTE — Telephone Encounter (Signed)
Pt wants the referral to go to [facility] so they can compare her and her sister results.. Pt also wants someone to touch base with her about what is going on with her.. Please advise

## 2016-06-23 NOTE — Telephone Encounter (Signed)
Please advise see below for update, thanks

## 2016-06-24 ENCOUNTER — Other Ambulatory Visit: Payer: Self-pay | Admitting: Internal Medicine

## 2016-06-24 DIAGNOSIS — Z803 Family history of malignant neoplasm of breast: Secondary | ICD-10-CM

## 2016-06-24 NOTE — Telephone Encounter (Signed)
Left a detailed message on patients VM with update and plan, thanks

## 2016-06-24 NOTE — Progress Notes (Signed)
Order placed for referral for genetic counseling.

## 2016-06-24 NOTE — Telephone Encounter (Signed)
I have placed the order for the referral.  Please call and let pt know that I am not in the office.  She had left message she wanted someone to call her about what is going on.  Order placed.  Please notify her.  Thanks

## 2016-07-20 ENCOUNTER — Telehealth: Payer: Self-pay | Admitting: Internal Medicine

## 2016-09-02 DIAGNOSIS — Z23 Encounter for immunization: Secondary | ICD-10-CM | POA: Diagnosis not present

## 2016-09-14 ENCOUNTER — Other Ambulatory Visit: Payer: Self-pay | Admitting: Internal Medicine

## 2016-09-14 DIAGNOSIS — Z760 Encounter for issue of repeat prescription: Secondary | ICD-10-CM

## 2016-10-18 ENCOUNTER — Other Ambulatory Visit (INDEPENDENT_AMBULATORY_CARE_PROVIDER_SITE_OTHER): Payer: Medicare Other

## 2016-10-18 DIAGNOSIS — E7800 Pure hypercholesterolemia, unspecified: Secondary | ICD-10-CM | POA: Diagnosis not present

## 2016-10-18 DIAGNOSIS — I471 Supraventricular tachycardia: Secondary | ICD-10-CM

## 2016-10-18 LAB — COMPREHENSIVE METABOLIC PANEL
ALT: 12 U/L (ref 0–35)
AST: 21 U/L (ref 0–37)
Albumin: 4 g/dL (ref 3.5–5.2)
Alkaline Phosphatase: 73 U/L (ref 39–117)
BUN: 19 mg/dL (ref 6–23)
CHLORIDE: 107 meq/L (ref 96–112)
CO2: 31 mEq/L (ref 19–32)
CREATININE: 0.82 mg/dL (ref 0.40–1.20)
Calcium: 9.6 mg/dL (ref 8.4–10.5)
GFR: 72.23 mL/min (ref >60.00)
GLUCOSE: 82 mg/dL (ref 70–99)
POTASSIUM: 4.6 meq/L (ref 3.5–5.1)
SODIUM: 143 meq/L (ref 135–145)
TOTAL PROTEIN: 6.6 g/dL (ref 6.0–8.3)
Total Bilirubin: 0.5 mg/dL (ref 0.2–1.2)

## 2016-10-18 LAB — LIPID PANEL
CHOLESTEROL: 168 mg/dL (ref 0–200)
HDL: 59.6 mg/dL (ref >39.00)
LDL CALC: 97 mg/dL (ref 0–99)
NONHDL: 108.79
Total CHOL/HDL Ratio: 3
Triglycerides: 58 mg/dL (ref 0.0–149.0)
VLDL: 11.6 mg/dL (ref 0.0–40.0)

## 2016-10-18 LAB — TSH: TSH: 1.08 u[IU]/mL (ref 0.35–4.50)

## 2016-10-21 ENCOUNTER — Ambulatory Visit (INDEPENDENT_AMBULATORY_CARE_PROVIDER_SITE_OTHER): Payer: Medicare Other | Admitting: Internal Medicine

## 2016-10-21 ENCOUNTER — Encounter: Payer: Self-pay | Admitting: Internal Medicine

## 2016-10-21 ENCOUNTER — Other Ambulatory Visit: Payer: Self-pay | Admitting: Internal Medicine

## 2016-10-21 DIAGNOSIS — J479 Bronchiectasis, uncomplicated: Secondary | ICD-10-CM | POA: Diagnosis not present

## 2016-10-21 DIAGNOSIS — Z760 Encounter for issue of repeat prescription: Secondary | ICD-10-CM

## 2016-10-21 DIAGNOSIS — F439 Reaction to severe stress, unspecified: Secondary | ICD-10-CM

## 2016-10-21 DIAGNOSIS — D473 Essential (hemorrhagic) thrombocythemia: Secondary | ICD-10-CM | POA: Diagnosis not present

## 2016-10-21 DIAGNOSIS — Z803 Family history of malignant neoplasm of breast: Secondary | ICD-10-CM

## 2016-10-21 DIAGNOSIS — I471 Supraventricular tachycardia: Secondary | ICD-10-CM

## 2016-10-21 DIAGNOSIS — D75839 Thrombocytosis, unspecified: Secondary | ICD-10-CM

## 2016-10-21 DIAGNOSIS — E7800 Pure hypercholesterolemia, unspecified: Secondary | ICD-10-CM

## 2016-10-21 NOTE — Progress Notes (Signed)
Pre visit review using our clinic review tool, if applicable. No additional management support is needed unless otherwise documented below in the visit note. 

## 2016-10-21 NOTE — Progress Notes (Signed)
Patient ID: Jody Howard, female   DOB: 1941-08-30, 75 y.o.   MRN: [id number]   Subjective:    Patient ID: Jody Howard, female    DOB: October 07, 1941, 75 y.o.   MRN: [id number]  HPI  Patient here for a scheduled follow up.  She reports physically she is doing well.  Feels good.  Energy is good.  Stays active.  Watches what she eats.  No chest pain.  No increased heart rate or palpitations.  No sob.  No acid reflux.  No abdominal pain or cramping.  Bowels stable.  Increased stress with some family issues.  Discussed at length with her today.  Offered counseling and psych referral.  She declines.  Does not feel she needs.     Past Medical History:  Diagnosis Date  . Anemia   . Atrial tachycardia (HCC)    s/p ablation  . Clostridium difficile infection 1997   required hospitalization  . Hyperlipidemia   . Mitral valve prolapse   . Paroxysmal supraventricular tachycardia ([place])   . Rheumatic fever    Past Surgical History:  Procedure Laterality Date  . ABLATION     for atrial tachycardia  . BREAST BIOPSY  1997  . TONSILLECTOMY  1944   Family History  Problem Relation Age of Onset  . Cancer Mother     hx of breast cancer  . Cancer Paternal Aunt     colon cancer  . Multiple myeloma Father   . Breast cancer Sister    Social History   Social History  . Marital status: Widowed    Spouse name: N/A  . Number of children: 2  . Years of education: N/A   Social History Main Topics  . Smoking status: Never Smoker  . Smokeless tobacco: Never Used  . Alcohol use 0.0 oz/week     Comment: occasional  . Drug use: No  . Sexual activity: No   Other Topics Concern  . None   Social History Narrative  . None    Outpatient Encounter Prescriptions as of 10/21/2016  Medication Sig  . CALCIUM PO Take 1 tablet by mouth daily.   . Cholecalciferol (VITAMIN D-3 PO) Take 1 capsule by mouth daily. Taking 2000 IU daily  . Multiple Vitamins-Minerals (MULTIVITAMIN PO) Take 1 tablet by  mouth daily.   . rosuvastatin (CRESTOR) 5 MG tablet Take 1 tablet (5 mg total) by mouth at bedtime.  . [DISCONTINUED] rosuvastatin (CRESTOR) 5 MG tablet TAKE 1/2 TABLET(2.5 MG) BY MOUTH DAILY   No facility-administered encounter medications on file as of 10/21/2016.     Review of Systems  Constitutional: Negative for appetite change and unexpected weight change.  HENT: Negative for congestion and sinus pressure.   Respiratory: Negative for cough, chest tightness and shortness of breath.   Cardiovascular: Negative for chest pain, palpitations and leg swelling.  Gastrointestinal: Negative for abdominal pain, diarrhea, nausea and vomiting.  Genitourinary: Negative for difficulty urinating and dysuria.  Musculoskeletal: Negative for back pain and joint swelling.  Skin: Negative for color change and rash.  Neurological: Negative for light-headedness and headaches.  Psychiatric/Behavioral: Negative for agitation and dysphoric mood.       Objective:    Physical Exam  Constitutional: She appears well-developed and well-nourished. No distress.  HENT:  Nose: Nose normal.  Mouth/Throat: Oropharynx is clear and moist.  Neck: Neck supple. No thyromegaly present.  Cardiovascular: Normal rate and regular rhythm.   Pulmonary/Chest: Breath sounds normal. No respiratory distress. She has no  wheezes.  Abdominal: Soft. Bowel sounds are normal. There is no tenderness.  Musculoskeletal: She exhibits no edema or tenderness.  Lymphadenopathy:    She has no cervical adenopathy.  Skin: No rash noted. No erythema.  Psychiatric: She has a normal mood and affect. Her behavior is normal.    BP (!) 100/58   Pulse (!) 57   Temp 97.6 F (36.4 C) (Oral)   Ht 5' 4"  (1.626 m)   Wt 117 lb 12.8 oz (53.4 kg)   LMP 01/29/1983   SpO2 98%   BMI 20.22 kg/m  Wt Readings from Last 3 Encounters:  10/21/16 117 lb 12.8 oz (53.4 kg)  04/20/16 115 lb 4 oz (52.3 kg)  03/02/16 114 lb 6.4 oz (51.9 kg)     Lab  Results  Component Value Date   WBC 5.7 03/30/2016   HGB 12.3 03/30/2016   HCT 37.4 03/30/2016   PLT 374.0 03/30/2016   GLUCOSE 82 10/18/2016   CHOL 168 10/18/2016   TRIG 58.0 10/18/2016   HDL 59.60 10/18/2016   LDLCALC 97 10/18/2016   ALT 12 10/18/2016   AST 21 10/18/2016   NA 143 10/18/2016   K 4.6 10/18/2016   CL 107 10/18/2016   CREATININE 0.82 10/18/2016   BUN 19 10/18/2016   CO2 31 10/18/2016   TSH 1.08 10/18/2016       Assessment & Plan:   Problem List Items Addressed This Visit    Atrial tachycardia ([place])    S/p ablation.  Has done well.  Follow.        Relevant Medications   rosuvastatin (CRESTOR) 5 MG tablet   Bronchiectasis (HCC)    Mild bronchiectasis.  Has been followed by Dr Madalyn Rob.  Stable.  Doing well.  Follow.       Family history of breast cancer    One sister has passed with breast cancer and the other sister recently diagnosed with advanced breast cancer.  Have placed order for referral for genetic counseling.  Will f/u with appt.        Pure hypercholesterolemia    On crestor.  Follow lipid panel and liver function tests.   Lab Results  Component Value Date   CHOL 168 10/18/2016   HDL 59.60 10/18/2016   LDLCALC 97 10/18/2016   TRIG 58.0 10/18/2016   CHOLHDL 3 10/18/2016        Relevant Medications   rosuvastatin (CRESTOR) 5 MG tablet   Other Relevant Orders   Comprehensive metabolic panel   Lipid panel   Stress    Increased stress as outlined.  Discussed at length with her today.  Discussed some treatment options.  Discussed referral to a counselor or psychiatrist.  She declines.  Does not feel she needs.  Follow.        Thrombocytosis (HCC)    Last platelet count wnl.  Follow cbc.       Relevant Orders   CBC with Differential/Platelet    Other Visit Diagnoses    Medication refill       Relevant Medications   rosuvastatin (CRESTOR) 5 MG tablet       Einar Pheasant, MD

## 2016-10-24 ENCOUNTER — Encounter: Payer: Self-pay | Admitting: Internal Medicine

## 2016-10-24 DIAGNOSIS — Z803 Family history of malignant neoplasm of breast: Secondary | ICD-10-CM | POA: Insufficient documentation

## 2016-10-24 MED ORDER — ROSUVASTATIN CALCIUM 5 MG PO TABS: 5.0000 mg | ORAL_TABLET | Freq: Every day | ORAL | 3 refills | Status: DC

## 2016-10-24 NOTE — Assessment & Plan Note (Signed)
Increased stress as outlined.  Discussed at length with her today.  Discussed some treatment options.  Discussed referral to a counselor or psychiatrist.  She declines.  Does not feel she needs.  Follow.

## 2016-10-24 NOTE — Assessment & Plan Note (Signed)
Mild bronchiectasis.  Has been followed by Dr Madalyn Rob.  Stable.  Doing well.  Follow.

## 2016-10-24 NOTE — Assessment & Plan Note (Signed)
Last platelet count wnl.  Follow cbc.

## 2016-10-24 NOTE — Assessment & Plan Note (Signed)
S/p ablation.  Has done well.  Follow.   

## 2016-10-24 NOTE — Assessment & Plan Note (Signed)
One sister has passed with breast cancer and the other sister recently diagnosed with advanced breast cancer.  Have placed order for referral for genetic counseling.  Will f/u with appt.

## 2016-10-24 NOTE — Assessment & Plan Note (Signed)
On crestor.  Follow lipid panel and liver function tests.   Lab Results  Component Value Date   CHOL 168 10/18/2016   HDL 59.60 10/18/2016   LDLCALC 97 10/18/2016   TRIG 58.0 10/18/2016   CHOLHDL 3 10/18/2016

## 2016-10-25 ENCOUNTER — Encounter: Payer: Self-pay | Admitting: Internal Medicine

## 2016-12-28 DIAGNOSIS — H2513 Age-related nuclear cataract, bilateral: Secondary | ICD-10-CM | POA: Diagnosis not present

## 2017-01-04 DIAGNOSIS — Z1231 Encounter for screening mammogram for malignant neoplasm of breast: Secondary | ICD-10-CM | POA: Diagnosis not present

## 2017-01-04 DIAGNOSIS — Z803 Family history of malignant neoplasm of breast: Secondary | ICD-10-CM | POA: Diagnosis not present

## 2017-01-04 LAB — HM MAMMOGRAPHY

## 2017-02-02 DIAGNOSIS — S9032XA Contusion of left foot, initial encounter: Secondary | ICD-10-CM | POA: Diagnosis not present

## 2017-03-02 ENCOUNTER — Ambulatory Visit: Payer: Medicare Other

## 2017-03-23 DIAGNOSIS — S9032XD Contusion of left foot, subsequent encounter: Secondary | ICD-10-CM | POA: Diagnosis not present

## 2017-03-23 DIAGNOSIS — M79675 Pain in left toe(s): Secondary | ICD-10-CM | POA: Diagnosis not present

## 2017-04-18 ENCOUNTER — Other Ambulatory Visit (INDEPENDENT_AMBULATORY_CARE_PROVIDER_SITE_OTHER): Payer: Medicare Other

## 2017-04-18 DIAGNOSIS — E7800 Pure hypercholesterolemia, unspecified: Secondary | ICD-10-CM | POA: Diagnosis not present

## 2017-04-18 DIAGNOSIS — D473 Essential (hemorrhagic) thrombocythemia: Secondary | ICD-10-CM

## 2017-04-18 DIAGNOSIS — D75839 Thrombocytosis, unspecified: Secondary | ICD-10-CM

## 2017-04-18 LAB — CBC WITH DIFFERENTIAL/PLATELET
BASOS PCT: 0.9 % (ref 0.0–3.0)
Basophils Absolute: 0 10*3/uL (ref 0.0–0.1)
EOS PCT: 4 % (ref 0.0–5.0)
Eosinophils Absolute: 0.2 10*3/uL (ref 0.0–0.7)
HEMATOCRIT: 36.1 % (ref 36.0–46.0)
Hemoglobin: 12.2 g/dL (ref 12.0–15.0)
LYMPHS PCT: 21.2 % (ref 12.0–46.0)
Lymphs Abs: 1 10*3/uL (ref 0.7–4.0)
MCHC: 33.7 g/dL (ref 30.0–36.0)
MCV: 93.9 fl (ref 78.0–100.0)
MONOS PCT: 10.5 % (ref 3.0–12.0)
Monocytes Absolute: 0.5 10*3/uL (ref 0.1–1.0)
NEUTROS PCT: 63.4 % (ref 43.0–77.0)
Neutro Abs: 3.1 10*3/uL (ref 1.4–7.7)
Platelets: 325 10*3/uL (ref 150.0–400.0)
RBC: 3.85 Mil/uL — AB (ref 3.87–5.11)
RDW: 13.5 % (ref 11.5–15.5)
WBC: 4.9 10*3/uL (ref 4.0–10.5)

## 2017-04-18 LAB — LIPID PANEL
CHOLESTEROL: 167 mg/dL (ref 0–200)
HDL: 55.7 mg/dL (ref >39.00)
LDL CALC: 98 mg/dL (ref 0–99)
NONHDL: 110.83
Total CHOL/HDL Ratio: 3
Triglycerides: 62 mg/dL (ref 0.0–149.0)
VLDL: 12.4 mg/dL (ref 0.0–40.0)

## 2017-04-18 LAB — COMPREHENSIVE METABOLIC PANEL
ALBUMIN: 4 g/dL (ref 3.5–5.2)
ALK PHOS: 62 U/L (ref 39–117)
ALT: 14 U/L (ref 0–35)
AST: 20 U/L (ref 0–37)
BUN: 18 mg/dL (ref 6–23)
CO2: 30 mEq/L (ref 19–32)
CREATININE: 0.91 mg/dL (ref 0.40–1.20)
Calcium: 9.4 mg/dL (ref 8.4–10.5)
Chloride: 107 mEq/L (ref 96–112)
GFR: 63.97 mL/min (ref >60.00)
GLUCOSE: 80 mg/dL (ref 70–99)
Potassium: 4.1 mEq/L (ref 3.5–5.1)
SODIUM: 142 meq/L (ref 135–145)
TOTAL PROTEIN: 6.4 g/dL (ref 6.0–8.3)
Total Bilirubin: 0.6 mg/dL (ref 0.2–1.2)

## 2017-04-21 ENCOUNTER — Ambulatory Visit (INDEPENDENT_AMBULATORY_CARE_PROVIDER_SITE_OTHER): Payer: Medicare Other | Admitting: Internal Medicine

## 2017-04-21 ENCOUNTER — Encounter: Payer: Self-pay | Admitting: Internal Medicine

## 2017-04-21 VITALS — BP 110/58 | HR 69 | Temp 98.6°F | Resp 12 | Ht 64.37 in | Wt 119.2 lb

## 2017-04-21 DIAGNOSIS — Z0000 Encounter for general adult medical examination without abnormal findings: Secondary | ICD-10-CM | POA: Diagnosis not present

## 2017-04-21 DIAGNOSIS — J479 Bronchiectasis, uncomplicated: Secondary | ICD-10-CM

## 2017-04-21 DIAGNOSIS — Z803 Family history of malignant neoplasm of breast: Secondary | ICD-10-CM | POA: Diagnosis not present

## 2017-04-21 DIAGNOSIS — F439 Reaction to severe stress, unspecified: Secondary | ICD-10-CM | POA: Diagnosis not present

## 2017-04-21 DIAGNOSIS — Z23 Encounter for immunization: Secondary | ICD-10-CM | POA: Diagnosis not present

## 2017-04-21 DIAGNOSIS — I471 Supraventricular tachycardia: Secondary | ICD-10-CM | POA: Diagnosis not present

## 2017-04-21 DIAGNOSIS — E7800 Pure hypercholesterolemia, unspecified: Secondary | ICD-10-CM

## 2017-04-21 NOTE — Progress Notes (Signed)
Patient ID: Jody Howard, female   DOB: 09-May-1941, 76 y.o.   MRN: [id number]   Subjective:    Patient ID: Jody Howard, female    DOB: 03/17/1941, 76 y.o.   MRN: [id number]  HPI  Patient with past history of hypercholesterolemia and atrial tachycardia.  She comes in today to follow up on these issues as well as for a complete physical exam.  She reports she is doing well.  Physically feels good.  Stays active.  Exercises.  Watches her diet.  No chest pain.  No increased heart rate or palpitations.  No sob.  No acid reflux.  No abdominal pain.  Bowels moving.  Increased stress.  Discussed with her today.  States she is doing better.  Does not feel needs any further intervention at this time.     Past Medical History:  Diagnosis Date  . Anemia   . Atrial tachycardia (HCC)    s/p ablation  . Clostridium difficile infection 1997   required hospitalization  . Hyperlipidemia   . Mitral valve prolapse   . Paroxysmal supraventricular tachycardia ([place])   . Rheumatic fever    Past Surgical History:  Procedure Laterality Date  . ABLATION     for atrial tachycardia  . BREAST BIOPSY  1997  . TONSILLECTOMY  1944   Family History  Problem Relation Age of Onset  . Cancer Mother        hx of breast cancer  . Cancer Paternal Aunt        colon cancer  . Multiple myeloma Father   . Breast cancer Sister    Social History   Social History  . Marital status: Widowed    Spouse name: N/A  . Number of children: 2  . Years of education: N/A   Social History Main Topics  . Smoking status: Never Smoker  . Smokeless tobacco: Never Used  . Alcohol use 0.0 oz/week     Comment: occasional  . Drug use: No  . Sexual activity: No   Other Topics Concern  . None   Social History Narrative  . None    Outpatient Encounter Prescriptions as of 04/21/2017  Medication Sig  . CALCIUM PO Take 1 tablet by mouth daily.   . Cholecalciferol (VITAMIN D-3 PO) Take 1 capsule by mouth daily. Taking  2000 IU daily  . CRESTOR 5 MG tablet TAKE 1/2 TABLET(2.5 MG) BY MOUTH DAILY  . Multiple Vitamins-Minerals (MULTIVITAMIN PO) Take 1 tablet by mouth daily.   . rosuvastatin (CRESTOR) 5 MG tablet Take 1 tablet (5 mg total) by mouth at bedtime.   No facility-administered encounter medications on file as of 04/21/2017.     Review of Systems  Constitutional: Negative for appetite change and unexpected weight change.  HENT: Negative for congestion and sinus pressure.   Eyes: Negative for pain and visual disturbance.  Respiratory: Negative for cough, chest tightness and shortness of breath.   Cardiovascular: Negative for chest pain, palpitations and leg swelling.  Gastrointestinal: Negative for abdominal pain, diarrhea, nausea and vomiting.  Genitourinary: Negative for difficulty urinating and dysuria.  Musculoskeletal: Negative for back pain and joint swelling.  Skin: Negative for color change and rash.  Neurological: Negative for dizziness, light-headedness and headaches.  Hematological: Negative for adenopathy. Does not bruise/bleed easily.  Psychiatric/Behavioral: Negative for agitation and dysphoric mood.       Objective:    Physical Exam  Constitutional: She is oriented to person, place, and time. She appears well-developed and  well-nourished. No distress.  HENT:  Nose: Nose normal.  Mouth/Throat: Oropharynx is clear and moist.  Eyes: Right eye exhibits no discharge. Left eye exhibits no discharge. No scleral icterus.  Neck: Neck supple. No thyromegaly present.  Cardiovascular: Normal rate and regular rhythm.   Pulmonary/Chest: Breath sounds normal. No accessory muscle usage. No tachypnea. No respiratory distress. She has no decreased breath sounds. She has no wheezes. She has no rhonchi. Right breast exhibits no inverted nipple, no mass, no nipple discharge and no tenderness (no axillary adenopathy). Left breast exhibits no inverted nipple, no mass, no nipple discharge and no  tenderness (no axilarry adenopathy).  Abdominal: Soft. Bowel sounds are normal. There is no tenderness.  Musculoskeletal: She exhibits no edema or tenderness.  Lymphadenopathy:    She has no cervical adenopathy.  Neurological: She is alert and oriented to person, place, and time.  Skin: Skin is warm. No rash noted. No erythema.  Psychiatric: She has a normal mood and affect. Her behavior is normal.    BP (!) 110/58 (BP Location: Left Arm, Patient Position: Sitting, Cuff Size: Normal)   Pulse 69   Temp 98.6 F (37 C) (Oral)   Resp 12   Ht 5' 4.37" (1.635 m)   Wt 119 lb 3.2 oz (54.1 kg)   LMP 01/29/1983   SpO2 98%   BMI 20.23 kg/m  Wt Readings from Last 3 Encounters:  04/21/17 119 lb 3.2 oz (54.1 kg)  10/21/16 117 lb 12.8 oz (53.4 kg)  04/20/16 115 lb 4 oz (52.3 kg)     Lab Results  Component Value Date   WBC 4.9 04/18/2017   HGB 12.2 04/18/2017   HCT 36.1 04/18/2017   PLT 325.0 04/18/2017   GLUCOSE 80 04/18/2017   CHOL 167 04/18/2017   TRIG 62.0 04/18/2017   HDL 55.70 04/18/2017   LDLCALC 98 04/18/2017   ALT 14 04/18/2017   AST 20 04/18/2017   NA 142 04/18/2017   K 4.1 04/18/2017   CL 107 04/18/2017   CREATININE 0.91 04/18/2017   BUN 18 04/18/2017   CO2 30 04/18/2017   TSH 1.08 10/18/2016       Assessment & Plan:   Problem List Items Addressed This Visit    Atrial tachycardia ([place])    S/p ablation.  Has done well.  Follow.        Relevant Orders   TSH   Bronchiectasis (HCC)    Mild bronchiectasis.  Previously followed by Dr Madalyn Rob.  Stable.  Breathing well.  Follow.       Family history of breast cancer    Two sisters with breast cancer history.  See last note.  Had referred her for genetics counseling.  She did not follow through with the appt.  Is ready now for evaluation.        Health care maintenance    Physical today 04/21/17.  Mammogram ordered.  Colonoscopy 2010.        Pure hypercholesterolemia    On crestor.  Low cholesterol diet and  exercise.  Follow lipid panel and liver function tests.        Relevant Orders   Lipid panel   Hepatic function panel   Basic metabolic panel   Stress    Increased stress as outlined.  Discussed with her today.  She does not feel she needs any further intervention at this time.  Follow.         Other Visit Diagnoses    Need for pneumococcal  vaccination    -  Primary   Relevant Orders   Pneumococcal conjugate vaccine 13-valent (Completed)       Einar Pheasant, MD

## 2017-04-21 NOTE — Progress Notes (Signed)
Pre-visit discussion using our clinic review tool. No additional management support is needed unless otherwise documented below in the visit note.  

## 2017-05-02 ENCOUNTER — Encounter: Payer: Self-pay | Admitting: Internal Medicine

## 2017-05-02 NOTE — Assessment & Plan Note (Signed)
On crestor.  Low cholesterol diet and exercise.  Follow lipid panel and liver function tests.   

## 2017-05-02 NOTE — Assessment & Plan Note (Signed)
Increased stress as outlined.  Discussed with her today.  She does not feel she needs any further intervention at this time.  Follow.

## 2017-05-02 NOTE — Assessment & Plan Note (Signed)
S/p ablation.  Has done well.  Follow.

## 2017-05-02 NOTE — Assessment & Plan Note (Signed)
Physical today 04/21/17.  Mammogram ordered.  Colonoscopy 2010.

## 2017-05-02 NOTE — Assessment & Plan Note (Signed)
Two sisters with breast cancer history.  See last note.  Had referred her for genetics counseling.  She did not follow through with the appt.  Is ready now for evaluation.

## 2017-05-02 NOTE — Assessment & Plan Note (Signed)
Mild bronchiectasis.  Previously followed by Dr Madalyn Rob.  Stable.  Breathing well.  Follow.

## 2017-07-20 ENCOUNTER — Ambulatory Visit (INDEPENDENT_AMBULATORY_CARE_PROVIDER_SITE_OTHER): Payer: Medicare Other

## 2017-07-20 ENCOUNTER — Telehealth: Payer: Self-pay | Admitting: *Deleted

## 2017-07-20 VITALS — BP 102/62 | HR 57 | Temp 97.9°F | Resp 12 | Ht 64.37 in | Wt 117.0 lb

## 2017-07-20 DIAGNOSIS — Z1389 Encounter for screening for other disorder: Secondary | ICD-10-CM | POA: Diagnosis not present

## 2017-07-20 DIAGNOSIS — Z0000 Encounter for general adult medical examination without abnormal findings: Secondary | ICD-10-CM

## 2017-07-20 DIAGNOSIS — Z1331 Encounter for screening for depression: Secondary | ICD-10-CM

## 2017-07-20 NOTE — Telephone Encounter (Signed)
Pt requested to see Dr. Nicki Reaper only. Pt would like to be seen for pain in her left arm x 2 weeks. Pt thinks this is a nerve issue. Please advise  Pt contact [phone]

## 2017-07-20 NOTE — Progress Notes (Signed)
Subjective:   KORTNEE BAS is a 76 y.o. female who presents for Medicare Annual (Subsequent) preventive examination.  Review of Systems:  No ROS.  Medicare Wellness Visit. Additional risk factors are reflected in the social history.  Cardiac Risk Factors include: advanced age (>68mn, >>31women)     Objective:     Vitals: BP 102/62 (BP Location: Right Arm, Patient Position: Sitting, Cuff Size: Normal)   Pulse (!) 57   Temp 97.9 F (36.6 C) (Oral)   Resp 12   Ht 5' 4.37" (1.635 m)   Wt 117 lb (53.1 kg)   LMP 01/29/1983   SpO2 100%   BMI 19.85 kg/m   Body mass index is 19.85 kg/m.   Tobacco History  Smoking Status  . Never Smoker  Smokeless Tobacco  . Never Used     Counseling given: Not Answered   Past Medical History:  Diagnosis Date  . Anemia   . Atrial tachycardia (HCC)    s/p ablation  . Clostridium difficile infection 1997   required hospitalization  . Hyperlipidemia   . Mitral valve prolapse   . [place]   . Rheumatic fever    Past Surgical History:  Procedure Laterality Date  . ABLATION     for atrial tachycardia  . BREAST BIOPSY  1997  . TONSILLECTOMY  1944   Family History  Problem Relation Age of Onset  . Cancer Mother        hx of breast cancer  . Cancer Paternal Aunt        colon cancer  . Multiple myeloma Father   . Breast cancer Sister   . Cancer Sister        Breast  . Heart attack Sister        due to iodine reaction   History  Sexual Activity  . Sexual activity: No    Outpatient Encounter Prescriptions as of 07/20/2017  Medication Sig  . CALCIUM PO Take 1 tablet by mouth daily.   . Cholecalciferol (VITAMIN D-3 PO) Take 1 capsule by mouth daily. Taking 2000 IU daily  . CRESTOR 5 MG tablet TAKE 1/2 TABLET(2.5 MG) BY MOUTH DAILY  . Multiple Vitamins-Minerals (MULTIVITAMIN PO) Take 1 tablet by mouth daily.   . rosuvastatin (CRESTOR) 5 MG tablet Take 1 tablet (5 mg total) by mouth at  bedtime.   No facility-administered encounter medications on file as of 07/20/2017.     Activities of Daily Living In your present state of health, do you have any difficulty performing the following activities: 07/20/2017  Difficulty concentrating or making decisions? N  Walking or climbing stairs? N  Dressing or bathing? N  Doing errands, shopping? N  Preparing Food and eating ? N  Using the Toilet? N  In the past six months, have you accidently leaked urine? N  Comment Prolapsed bladder.  Do you have problems with loss of bowel control? N  Managing your Medications? N  Managing your Finances? N  Housekeeping or managing your Housekeeping? N  Some recent data might be hidden    Patient Care Team: SEinar Pheasant MD as PCP - General (Internal Medicine)    Assessment:    This is a routine wellness examination for Jody Howard. The goal of the wellness visit is to assist the patient how to close the gaps in care and create a preventative care plan for the patient.   The roster of all physicians providing medical care to patient is listed in  the Snapshot section of the chart.  Taking calcium VIT D as appropriate/Osteoporosis risk reviewed.    Safety issues reviewed; Smoke and carbon monoxide detectors in the home. No firearms in the home.  Wears seatbelts when driving or riding with others. Patient does wear sunscreen or protective clothing when in direct sunlight. No violence in the home.  Depression- PHQ 2 &9 complete.  No signs/symptoms or verbal communication regarding little pleasure in doing things, feeling down, depressed or hopeless. No changes in sleeping, energy, eating, concentrating.  No thoughts of self harm or harm towards others.  Time spent on this topic is 8 minutes.   Patient is alert, normal appearance, oriented to person/place/and time.  Correctly identified the president of the [place], recall of 2/3 words, and performing simple calculations. Displays appropriate  judgement and can read correct time from watch face.   No new identified risk were noted.  No failures at ADL's or IADL's.    BMI- discussed the importance of a healthy diet, water intake and the benefits of aerobic exercise. Educational material provided.   24 hour diet recall: Daily fluid intake: 1 cups of caffeine, 6 cups of water  Dental- every 6 months.   Low carb foods  Eye- Visual acuity not assessed per patient preference since they have regular follow up with the ophthalmologist.  Wears corrective lenses.  Sleep patterns- Sleeps 7 hours at night.  Wakes feeling rested.  TDAP vaccine deferred per patient preference.  Follow up with insurance.  Educational material provided.  Patient Concerns: Left arm pain, intermittent.  States she has been helping a neighbor move and has some pain from time to time. Onset 3 weeks ago. Taking baby aspirin or aleve as needed. No chest pain, SOB.  Encouraged to follow up with PCP.   Phone number provided for DNA testing in [facility] per her request.  Exercise Activities and Dietary recommendations Current Exercise Habits: Home exercise routine, Type of exercise: walking, Time (Minutes): 20, Frequency (Times/Week): 4, Weekly Exercise (Minutes/Week): 80, Intensity: Mild  Goals    . Maintain Healthy Lifestyle          Stay hydrated and drink plenty of fluids. Low carb foods.  Lean meats, fruits and vegetables. Home exercise regimen and walking.      Fall Risk Fall Risk  07/20/2017 04/21/2017 03/02/2016 01/20/2015 06/06/2013  Falls in the past year? _0   Risk for fall due to : - Other (Comment) - - -  Risk for fall due to: Comment - no falls risk  - - -   Depression Screen PHQ 2/9 Scores 07/20/2017 04/21/2017 03/02/2016 01/20/2015  PHQ - 2 Score 0 0 0 0  PHQ- 9 Score 0 0 - -     Cognitive Function MMSE - Mini Mental State Exam 07/20/2017 03/02/2016  Orientation to time 5 5  Orientation to Place 5 5  Registration 3 3  Attention/  Calculation 5 5  Recall 2 3  Language- name 2 objects 2 2  Language- repeat 1 1  Language- follow 3 step command 3 3  Language- read & follow direction 1 1  Write a sentence 1 1  Copy design 1 1  Total score 29 30        Immunization History  Administered Date(s) Administered  . Influenza Split 09/10/2013  . Influenza, High Dose Seasonal PF 10/09/2014  . Influenza,inj,Quad PF,36+ Mos 08/01/2015  . Influenza-Unspecified 09/16/2016  . Pneumococcal Conjugate-13 04/21/2017   Screening Tests Health  Maintenance  Topic Date Due  . TETANUS/TDAP  10/20/1960  . INFLUENZA VACCINE  07/06/2017  . MAMMOGRAM  01/04/2018  . PNA vac Low Risk Adult (2 of 2 - PPSV23) 04/21/2018  . COLONOSCOPY  01/10/2019  . DEXA SCAN  Completed      Plan:    End of life planning; Advance aging; Advanced directives discussed. Copy of current HCPOA/Living Will requested.    I have personally reviewed and noted the following in the patient's chart:   . Medical and social history . Use of alcohol, tobacco or illicit drugs  . Current medications and supplements . Functional ability and status . Nutritional status . Physical activity . Advanced directives . List of other physicians . Hospitalizations, surgeries, and ER visits in previous 12 months . Vitals . Screenings to include cognitive, depression, and falls . Referrals and appointments  In addition, I have reviewed and discussed with patient certain preventive protocols, quality metrics, and best practice recommendations. A written personalized care plan for preventive services as well as general preventive health recommendations were provided to patient.     Varney Biles, LPN  5/48/6282   Reviewed above information.  Agree with assessment and plan.  If she is having persistent arm pain, does need evaluation.  Message sent.    Dr Nicki Reaper

## 2017-07-20 NOTE — Telephone Encounter (Signed)
No answer no voice mail  

## 2017-07-20 NOTE — Patient Instructions (Addendum)
  Ms. Limon , Thank you for taking time to come for your Medicare Wellness Visit. I appreciate your ongoing commitment to your health goals. Please review the following plan we discussed and let me know if I can assist you in the future.   Follow up with Dr. Nicki Reaper as needed.    Bring a copy of your [place] and/or Living Will to be scanned into chart.  Have a great day!  These are the goals we discussed: Goals    . Maintain Healthy Lifestyle          Stay hydrated and drink plenty of fluids. Low carb foods.  Lean meats, fruits and vegetables. Home exercise regimen and walking.       This is a list of the screening recommended for you and due dates:  Health Maintenance  Topic Date Due  . Tetanus Vaccine  10/20/1960  . Flu Shot  07/06/2017  . Mammogram  01/04/2018  . Pneumonia vaccines (2 of 2 - PPSV23) 04/21/2018  . Colon Cancer Screening  01/10/2019  . DEXA scan (bone density measurement)  Completed

## 2017-07-21 NOTE — Telephone Encounter (Signed)
No answer, no voicemail. If patient returns a call to the office please confirm if symptom is persistent and requires an acute visit.  If acute visit is needed, schedule with Dr. Nicki Reaper at soonest availability or encourage to be seen at Urgent Care.

## 2017-07-22 ENCOUNTER — Telehealth: Payer: Self-pay | Admitting: Internal Medicine

## 2017-07-22 NOTE — Telephone Encounter (Signed)
-----   Message from Dia Crawford, LPN sent at 7/35/6701  4:45 PM EDT ----- Regarding: RE: appt I left a detailed telephone note for whomever may answer the phone if she calls back.  I tried to call, no answer, no voicemail. ----- Message ----- From: Einar Pheasant, MD Sent: 07/21/2017   1:34 PM To: Denisa L O'Brien-Blaney, LPN Subject: appt                                           Reviewed your note.  If she is having persistent arm pain, needs to be evaluated.  I am unable to see her today or tomorrow and will be out of office for a few days next week.  Can schedule acute appt or she can go to acute care.  Thanks    Dr Nicki Reaper

## 2017-07-22 NOTE — Telephone Encounter (Signed)
Noted.  Thank you.  Let me know if I need to do anything more.

## 2017-09-26 DIAGNOSIS — L821 Other seborrheic keratosis: Secondary | ICD-10-CM | POA: Diagnosis not present

## 2017-09-26 DIAGNOSIS — D485 Neoplasm of uncertain behavior of skin: Secondary | ICD-10-CM | POA: Diagnosis not present

## 2017-09-26 DIAGNOSIS — D2272 Melanocytic nevi of left lower limb, including hip: Secondary | ICD-10-CM | POA: Diagnosis not present

## 2017-09-26 DIAGNOSIS — D2271 Melanocytic nevi of right lower limb, including hip: Secondary | ICD-10-CM | POA: Diagnosis not present

## 2017-09-26 DIAGNOSIS — D225 Melanocytic nevi of trunk: Secondary | ICD-10-CM | POA: Diagnosis not present

## 2017-09-26 DIAGNOSIS — D1801 Hemangioma of skin and subcutaneous tissue: Secondary | ICD-10-CM | POA: Diagnosis not present

## 2017-09-26 DIAGNOSIS — B353 Tinea pedis: Secondary | ICD-10-CM | POA: Diagnosis not present

## 2017-09-26 DIAGNOSIS — B0089 Other herpesviral infection: Secondary | ICD-10-CM | POA: Diagnosis not present

## 2017-09-30 ENCOUNTER — Ambulatory Visit (INDEPENDENT_AMBULATORY_CARE_PROVIDER_SITE_OTHER): Payer: Medicare Other

## 2017-09-30 DIAGNOSIS — Z23 Encounter for immunization: Secondary | ICD-10-CM

## 2017-10-21 ENCOUNTER — Other Ambulatory Visit (INDEPENDENT_AMBULATORY_CARE_PROVIDER_SITE_OTHER): Payer: Medicare Other

## 2017-10-21 DIAGNOSIS — I471 Supraventricular tachycardia: Secondary | ICD-10-CM

## 2017-10-21 DIAGNOSIS — E7800 Pure hypercholesterolemia, unspecified: Secondary | ICD-10-CM | POA: Diagnosis not present

## 2017-10-21 LAB — LIPID PANEL
CHOLESTEROL: 201 mg/dL — AB (ref 0–200)
HDL: 60 mg/dL (ref >39.00)
LDL CALC: 130 mg/dL — AB (ref 0–99)
NonHDL: 140.87
TRIGLYCERIDES: 56 mg/dL (ref 0.0–149.0)
Total CHOL/HDL Ratio: 3
VLDL: 11.2 mg/dL (ref 0.0–40.0)

## 2017-10-21 LAB — BASIC METABOLIC PANEL
BUN: 19 mg/dL (ref 6–23)
CALCIUM: 9.7 mg/dL (ref 8.4–10.5)
CO2: 31 mEq/L (ref 19–32)
Chloride: 105 mEq/L (ref 96–112)
Creatinine, Ser: 0.89 mg/dL (ref 0.40–1.20)
GFR: 65.54 mL/min (ref >60.00)
GLUCOSE: 90 mg/dL (ref 70–99)
Potassium: 4.2 mEq/L (ref 3.5–5.1)
Sodium: 142 mEq/L (ref 135–145)

## 2017-10-21 LAB — HEPATIC FUNCTION PANEL
ALBUMIN: 4 g/dL (ref 3.5–5.2)
ALT: 12 U/L (ref 0–35)
AST: 20 U/L (ref 0–37)
Alkaline Phosphatase: 71 U/L (ref 39–117)
Bilirubin, Direct: 0.1 mg/dL (ref 0.0–0.3)
Total Bilirubin: 0.5 mg/dL (ref 0.2–1.2)
Total Protein: 6.7 g/dL (ref 6.0–8.3)

## 2017-10-21 LAB — TSH: TSH: 2.12 u[IU]/mL (ref 0.35–4.50)

## 2017-10-24 ENCOUNTER — Encounter: Payer: Self-pay | Admitting: Internal Medicine

## 2017-10-24 ENCOUNTER — Ambulatory Visit (INDEPENDENT_AMBULATORY_CARE_PROVIDER_SITE_OTHER): Payer: Medicare Other | Admitting: Internal Medicine

## 2017-10-24 DIAGNOSIS — J479 Bronchiectasis, uncomplicated: Secondary | ICD-10-CM

## 2017-10-24 DIAGNOSIS — I471 Supraventricular tachycardia: Secondary | ICD-10-CM

## 2017-10-24 DIAGNOSIS — Z760 Encounter for issue of repeat prescription: Secondary | ICD-10-CM | POA: Diagnosis not present

## 2017-10-24 DIAGNOSIS — E7800 Pure hypercholesterolemia, unspecified: Secondary | ICD-10-CM | POA: Diagnosis not present

## 2017-10-24 DIAGNOSIS — F439 Reaction to severe stress, unspecified: Secondary | ICD-10-CM | POA: Diagnosis not present

## 2017-10-24 MED ORDER — ROSUVASTATIN CALCIUM 5 MG PO TABS
ORAL_TABLET | ORAL | 3 refills | Status: DC
Start: 2017-10-24 — End: 2018-05-10

## 2017-10-24 NOTE — Progress Notes (Signed)
Patient ID: Jody Howard, female   DOB: 19-Jan-1941, 76 y.o.   MRN: [id number]   Subjective:    Patient ID: Jody Howard, female    DOB: 03/10/1941, 76 y.o.   MRN: [id number]  HPI  Patient here for a scheduled follow up.  She reports she is doing relatively well.  Some increased stress.  We discussed this today.  She does not feel needs any further intervention and actually declines.  Stays active.  No chest pain.  Does report noticing some occasional palpitations at night.  No pain.  No sob.  Would like to f/u with her cardiologist.  No acid reflux.  No abdominal pain.  Bowels moving.  No urine change.     Past Medical History:  Diagnosis Date  . Anemia   . Atrial tachycardia (HCC)    s/p ablation  . Clostridium difficile infection 1997   required hospitalization  . Hyperlipidemia   . Mitral valve prolapse   . Paroxysmal supraventricular tachycardia ([place])   . Rheumatic fever    Past Surgical History:  Procedure Laterality Date  . ABLATION     for atrial tachycardia  . BREAST BIOPSY  1997  . TONSILLECTOMY  1944   Family History  Problem Relation Age of Onset  . Cancer Mother        hx of breast cancer  . Cancer Paternal Aunt        colon cancer  . Multiple myeloma Father   . Breast cancer Sister   . Cancer Sister        Breast  . Heart attack Sister        due to iodine reaction   Social History   Socioeconomic History  . Marital status: Widowed    Spouse name: None  . Number of children: 2  . Years of education: None  . Highest education level: None  Social Needs  . Financial resource strain: None  . Food insecurity - worry: None  . Food insecurity - inability: None  . Transportation needs - medical: None  . Transportation needs - non-medical: None  Occupational History  . None  Tobacco Use  . Smoking status: Never Smoker  . Smokeless tobacco: Never Used  Substance and Sexual Activity  . Alcohol use: Yes    Alcohol/week: 0.0 oz    Comment:  occasional  . Drug use: No  . Sexual activity: No  Other Topics Concern  . None  Social History Narrative  . None    Outpatient Encounter Medications as of 10/24/2017  Medication Sig  . CALCIUM PO Take 1 tablet by mouth daily.   . Cholecalciferol (VITAMIN D-3 PO) Take 1 capsule by mouth daily. Taking 2000 IU daily  . CRESTOR 5 MG tablet TAKE 1/2 TABLET(2.5 MG) BY MOUTH DAILY  . Multiple Vitamins-Minerals (MULTIVITAMIN PO) Take 1 tablet by mouth daily.   . rosuvastatin (CRESTOR) 5 MG tablet Name brand only  . [DISCONTINUED] rosuvastatin (CRESTOR) 5 MG tablet Take 1 tablet (5 mg total) by mouth at bedtime.   No facility-administered encounter medications on file as of 10/24/2017.     Review of Systems  Constitutional: Negative for appetite change and unexpected weight change.  HENT: Negative for congestion and sinus pressure.   Respiratory: Negative for cough, chest tightness and shortness of breath.   Cardiovascular: Positive for palpitations. Negative for chest pain and leg swelling.  Gastrointestinal: Negative for abdominal pain, diarrhea, nausea and vomiting.  Genitourinary: Negative for difficulty urinating and  dysuria.  Musculoskeletal: Negative for back pain and joint swelling.  Skin: Negative for color change and rash.  Neurological: Negative for dizziness, light-headedness and headaches.  Psychiatric/Behavioral: Negative for agitation and dysphoric mood.       Objective:    Physical Exam  Constitutional: She appears well-developed and well-nourished. No distress.  HENT:  Nose: Nose normal.  Mouth/Throat: Oropharynx is clear and moist.  Neck: Neck supple. No thyromegaly present.  Cardiovascular: Normal rate and regular rhythm.  Pulmonary/Chest: Breath sounds normal. No respiratory distress. She has no wheezes.  Abdominal: Soft. Bowel sounds are normal. There is no tenderness.  Musculoskeletal: She exhibits no edema or tenderness.  Lymphadenopathy:    She has no  cervical adenopathy.  Skin: No rash noted. No erythema.  Psychiatric: She has a normal mood and affect. Her behavior is normal.    BP 110/64 (BP Location: Left Arm, Patient Position: Sitting, Cuff Size: Normal)   Pulse 62   Temp 98.6 F (37 C) (Oral)   Resp 14   Ht 5' 4"  (1.626 m)   Wt 116 lb 6.4 oz (52.8 kg)   LMP 01/29/1983   SpO2 98%   BMI 19.98 kg/m  Wt Readings from Last 3 Encounters:  10/24/17 116 lb 6.4 oz (52.8 kg)  07/20/17 117 lb (53.1 kg)  04/21/17 119 lb 3.2 oz (54.1 kg)     Lab Results  Component Value Date   WBC 4.9 04/18/2017   HGB 12.2 04/18/2017   HCT 36.1 04/18/2017   PLT 325.0 04/18/2017   GLUCOSE 90 10/21/2017   CHOL 201 (H) 10/21/2017   TRIG 56.0 10/21/2017   HDL 60.00 10/21/2017   LDLCALC 130 (H) 10/21/2017   ALT 12 10/21/2017   AST 20 10/21/2017   NA 142 10/21/2017   K 4.2 10/21/2017   CL 105 10/21/2017   CREATININE 0.89 10/21/2017   BUN 19 10/21/2017   CO2 31 10/21/2017   TSH 2.12 10/21/2017       Assessment & Plan:   Problem List Items Addressed This Visit    Atrial tachycardia ([place])    S/p ablation.  Has done well.  Overall improved.  Notices occasional palpitations at night.  She request f/u with her cardiologist.  Declines EKG or further w/up today.        Relevant Medications   rosuvastatin (CRESTOR) 5 MG tablet   Other Relevant Orders   Ambulatory referral to Cardiology   CBC with Differential/Platelet   Bronchiectasis (HCC)    Mild bronchiectasis.  Previously followed by Dr Madalyn Rob.  Stable.  Breathing well.  No cough or congestion.  Follow.       Pure hypercholesterolemia    On crestor.  Low cholesterol diet and exercise.  Follow lipid panel and liver function tests.        Relevant Medications   rosuvastatin (CRESTOR) 5 MG tablet   Other Relevant Orders   Hepatic function panel   Lipid panel   Basic metabolic panel   Stress    Increased stress.  Discussed with her today.  She declines any further intervention.           Other Visit Diagnoses    Medication refill       Relevant Medications   rosuvastatin (CRESTOR) 5 MG tablet       Einar Pheasant, MD

## 2017-10-27 ENCOUNTER — Encounter: Payer: Self-pay | Admitting: Internal Medicine

## 2017-10-27 NOTE — Assessment & Plan Note (Signed)
On crestor.  Low cholesterol diet and exercise.  Follow lipid panel and liver function tests.   

## 2017-10-27 NOTE — Assessment & Plan Note (Signed)
Increased stress.  Discussed with her today.  She declines any further intervention.

## 2017-10-27 NOTE — Assessment & Plan Note (Signed)
Mild bronchiectasis.  Previously followed by Dr Madalyn Rob.  Stable.  Breathing well.  No cough or congestion.  Follow.

## 2017-10-27 NOTE — Assessment & Plan Note (Signed)
S/p ablation.  Has done well.  Overall improved.  Notices occasional palpitations at night.  She request f/u with her cardiologist.  Declines EKG or further w/up today.

## 2017-12-20 ENCOUNTER — Emergency Department: Payer: Medicare Other

## 2017-12-20 ENCOUNTER — Encounter: Payer: Self-pay | Admitting: Medical Oncology

## 2017-12-20 ENCOUNTER — Ambulatory Visit: Payer: Self-pay | Admitting: *Deleted

## 2017-12-20 ENCOUNTER — Emergency Department
Admission: EM | Admit: 2017-12-20 | Discharge: 2017-12-20 | Disposition: A | Discharge status: Home / Self Care | Payer: Medicare Other | Attending: Emergency Medicine | Admitting: Emergency Medicine

## 2017-12-20 DIAGNOSIS — S93402A Sprain of unspecified ligament of left ankle, initial encounter: Secondary | ICD-10-CM | POA: Diagnosis not present

## 2017-12-20 DIAGNOSIS — R55 Syncope and collapse: Secondary | ICD-10-CM | POA: Insufficient documentation

## 2017-12-20 DIAGNOSIS — S0990XA Unspecified injury of head, initial encounter: Secondary | ICD-10-CM | POA: Diagnosis not present

## 2017-12-20 DIAGNOSIS — Z79899 Other long term (current) drug therapy: Secondary | ICD-10-CM | POA: Diagnosis not present

## 2017-12-20 DIAGNOSIS — R000 Tachycardia, unspecified: Secondary | ICD-10-CM | POA: Diagnosis not present

## 2017-12-20 DIAGNOSIS — M25552 Pain in left hip: Secondary | ICD-10-CM | POA: Diagnosis not present

## 2017-12-20 LAB — CBC
HEMATOCRIT: 37.8 % (ref 35.0–47.0)
Hemoglobin: 12.6 g/dL (ref 12.0–16.0)
MCH: 31 pg (ref 26.0–34.0)
MCHC: 33.3 g/dL (ref 32.0–36.0)
MCV: 93.1 fL (ref 80.0–100.0)
Platelets: 343 10*3/uL (ref 150–440)
RBC: 4.06 MIL/uL (ref 3.80–5.20)
RDW: 13.3 % (ref 11.5–14.5)
WBC: 11.4 10*3/uL — ABNORMAL HIGH (ref 3.6–11.0)

## 2017-12-20 LAB — BASIC METABOLIC PANEL
Anion gap: 9 (ref 5–15)
BUN: 27 mg/dL — AB (ref 6–20)
CO2: 27 mmol/L (ref 22–32)
Calcium: 10.2 mg/dL (ref 8.9–10.3)
Chloride: 106 mmol/L (ref 101–111)
Creatinine, Ser: 0.86 mg/dL (ref 0.44–1.00)
GFR calc Af Amer: >60 mL/min (ref >60)
GLUCOSE: 115 mg/dL — AB (ref 65–99)
POTASSIUM: 4.3 mmol/L (ref 3.5–5.1)
Sodium: 142 mmol/L (ref 135–145)

## 2017-12-20 LAB — TROPONIN I: Troponin I: <0.03 ng/mL (ref <0.03)

## 2017-12-20 NOTE — Discharge Instructions (Signed)
your EKG blood work and x-rays were okay here. Please follow-up with your regular doctor and orthopedic surgeon. I would use of meloxicam and should help your ankle. I would also wear either the air splint or the lace up splint that they gave you at [facility]. please return for any further problems fever vomiting or continued diarrhea.

## 2017-12-20 NOTE — Telephone Encounter (Signed)
Patient reports she took her medications ( vitamins) today, she went to the bathroom- and almost fainted while she was on the toilet. She fell to the floor and laid there until she felt she was no longer dizzy and when she stood to walk- she twisted her ankle. She is very nervous about the incident- and what happened and would like to be seen.  Called office- there were no openings with provider. Flow coordinator stated UC or ED would be appropriate for patient. Patient is going to see if she can get in with orthopedic- she does not want to go to ED.  Reason for Disposition . [1] Age > 50 years  AND [2] now alert and feels fine  Answer Assessment - Initial Assessment Questions 1. ONSET: "How long were you unconscious?" (minutes) "When did it happen?"     Patient was on the toilet and she went down to the floor- she states she twisted her ankle- patient did not lose consciousness 2. CONTENT: "What happened during period of unconsciousness?" (e.g., seizure activity)      n/a 3. MENTAL STATUS: "Alert and oriented now?" (oriented x 3 = name, month, location)      Alert and oriented- patient is a little shaken as far as what happened 4. TRIGGER: "What do you think caused the fainting?" "What were you doing just before you fainted?"  (e.g., exercise, sudden standing up, prolonged standing)     Never happened before- possible pills- vitamins/suppliments reactions 5. RECURRENT SYMPTOM: "Have you ever passed out before?" If so, ask: "When was the last time?" and "What happened that time?"      Never fainted before 6. INJURY: "Did you sustain any injury during the fall?"      Possible sprain to left ankle 7. CARDIAC SYMPTOMS: "Have you had any of the following symptoms: chest pain, difficulty breathing, palpitations?"     no 8. NEUROLOGIC SYMPTOMS: "Have you had any of the following symptoms: headache, numbness, vertigo, weakness?"     Weakness this morning during the incident- but not before 9. GI  SYMPTOMS: "Have you had any of the following symptoms: abdominal pain, vomiting, diarrhea, blood in stools?"     Huge amount of stool today- never that much 10. OTHER SYMPTOMS: "Do you have any other symptoms?"       No other problems 11. PREGNANCY: "Is there any chance you are pregnant?" "When was your last menstrual period?"       n/a  Protocols used: [facility]

## 2017-12-20 NOTE — Telephone Encounter (Signed)
If near syncope and fell, she does need to be seen.  Agree with acute evaluation today and then can f/u after.  (would need to be seen by someone to try and confirm etiology of her near syncope (only md mentioned seeing is ortho)

## 2017-12-20 NOTE — Telephone Encounter (Signed)
FYI

## 2017-12-20 NOTE — ED Triage Notes (Signed)
Pt reports she had an episode of diarrhea this am then while walking out of the restroom she had a syncopal episode and fell to the floor. Pt reports since then she has been having pain to her left leg from ankle up to her hip. Unsure if she hit her head. Pt A/O x 4 in triage.

## 2017-12-20 NOTE — ED Provider Notes (Addendum)
[facility] Emergency Department Provider Note   ____________________________________________   First MD Initiated Contact with Patient 12/20/17 1722     (approximate)  I have reviewed the triage vital signs and the nursing notes.   HISTORY  Chief Complaint Loss of Consciousness    HPI Jody Howard is a 77 y.o. female Patient reports this morning she took all of her medications and had the sudden urge to go to the bathroom she sat on the toilet had a huge diarrheal stool and seemed to get a little lightheaded and woke up on the floor. She complains of pain in her left ankle. She went from home to see him or worth with a diagnosed her with a tendon or ligament tear in her left ankle and put her in a lace up ankle splint. She said that made the pain worse and she couldn't take it and the pain was shooting all the way up to her hip. She came into the ER complaining of pain in the hip and then wanted to be checked out for the syncope. She is not sure if she hit her head when she passed out she has no neck pain she has no chest pain she is feeling better now and the pain was going from her hip down to her ankle or ankle up to her hip is now again limited to the ankle area. She reports a sore throat did an x-ray of the ankle and did not see anything broken in the ankle or the foot.   Past Medical History:  Diagnosis Date  . Anemia   . Atrial tachycardia (HCC)    s/p ablation  . Clostridium difficile infection 1997   required hospitalization  . Hyperlipidemia   . Mitral valve prolapse   . Paroxysmal supraventricular tachycardia ([place])   . Rheumatic fever     Patient Active Problem List   Diagnosis Date Noted  . Family history of breast cancer 10/24/2016  . Right hip pain 07/23/2015  . Stress 01/21/2015  . Health care maintenance 01/21/2015  . Female bladder prolapse 09/24/2014  . Vaginal bleeding 09/24/2014  . Abdominal pain, left lower quadrant  09/24/2014  . Left arm pain 04/07/2014  . Dysuria 06/14/2013  . Vaginal prolapse 06/06/2013  . Pure hypercholesterolemia 02/11/2013  . Bronchiectasis ([place]) 02/11/2013  . Atrial tachycardia ([place]) 01/30/2010    Past Surgical History:  Procedure Laterality Date  . ABLATION     for atrial tachycardia  . BREAST BIOPSY  1997  . TONSILLECTOMY  1944    Prior to Admission medications   Medication Sig Start Date End Date Taking? Authorizing Provider  CALCIUM PO Take 1 tablet by mouth daily.     [provider]  Cholecalciferol (VITAMIN D-3 PO) Take 1 capsule by mouth daily. Taking 2000 IU daily    [provider]  CRESTOR 5 MG tablet TAKE 1/2 TABLET(2.5 MG) BY MOUTH DAILY 10/21/16   Einar Pheasant, MD  Multiple Vitamins-Minerals (MULTIVITAMIN PO) Take 1 tablet by mouth daily.     [provider]  rosuvastatin (CRESTOR) 5 MG tablet Name brand only 10/24/17   Einar Pheasant, MD    Allergies Codeine; Lipitor [atorvastatin]; Simvastatin; and Flagyl [metronidazole]  Family History  Problem Relation Age of Onset  . Cancer Mother        hx of breast cancer  . Cancer Paternal Aunt        colon cancer  . Multiple myeloma Father   .  Breast cancer Sister   . Cancer Sister        Breast  . Heart attack Sister        due to iodine reaction    Social History Social History   Tobacco Use  . Smoking status: Never Smoker  . Smokeless tobacco: Never Used  Substance Use Topics  . Alcohol use: Yes    Alcohol/week: 0.0 oz    Comment: occasional  . Drug use: No    Review of Systems  Constitutional: No fever/chills Eyes: No visual changes. ENT: No sore throat. Cardiovascular: Denies chest pain. Respiratory: Denies shortness of breath. Gastrointestinal: No abdominal pain.  No nausea, no vomiting.  No diarrhea.  No constipation. Genitourinary: Negative for dysuria. Musculoskeletal: Negative for back pain. Skin: Negative for rash. Neurological: Negative for  headaches, focal weakness  ____________________________________________   PHYSICAL EXAM:  VITAL SIGNS: ED Triage Vitals  Enc Vitals Group     BP 12/20/17 1553 98/77     Pulse Rate 12/20/17 1553 72     Resp 12/20/17 1553 16     Temp 12/20/17 1553 98.4 F (36.9 C)     Temp Source 12/20/17 1553 Oral     SpO2 12/20/17 1553 99 %     Weight 12/20/17 1554 111 lb (50.3 kg)     Height 12/20/17 1554 5' 3"  (1.6 m)     Head Circumference --      Peak Flow --      Pain Score 12/20/17 1553 10     Pain Loc --      Pain Edu? --      Excl. in [place]? --     Constitutional: Alert and oriented. Well appearing and in no acute distress. Eyes: Conjunctivae are normal. . Head: Atraumatic. Nose: No congestion/rhinnorhea. Mouth/Throat: Mucous membranes are moist.  Oropharynx non-erythematous. Neck: No stridor. No cervical spine tenderness to palpation. Cardiovascular: Normal rate, regular rhythm. Grossly normal heart sounds.  Good peripheral circulation. Respiratory: Normal respiratory effort.  No retractions. Lungs CTAB. Gastrointestinal: Soft and nontender. No distention. No abdominal bruits. No CVA tenderness. Musculoskeletal: No lower extremity tenderness he except for around the left ankle and the left lateral foot. nor edema.  No joint effusions. Neurologic:  Normal speech and language. No gross focal neurologic deficits are appreciated.  Skin:  Skin is warm, dry and intact. No rash noted. Psychiatric: Mood and affect are normal. Speech and behavior are normal.  ____________________________________________   LABS (all labs ordered are listed, but only abnormal results are displayed)  Labs Reviewed  BASIC METABOLIC PANEL - Abnormal; Notable for the following components:      Result Value   Glucose, Bld 115 (*)    BUN 27 (*)    All other components within normal limits  CBC - Abnormal; Notable for the following components:   WBC 11.4 (*)    All other components within normal limits    TROPONIN I  URINALYSIS, COMPLETE (UACMP) WITH MICROSCOPIC   ____________________________________________  EKG  EKG read and interpreted by me shows normal sinus rhythm rate of 61 normal axis essentially normal EKG ____________________________________________  RADIOLOGY  Ct Head Wo Contrast  Result Date: 12/20/2017 CLINICAL DATA:  Syncopal episode with fall. EXAM: CT HEAD WITHOUT CONTRAST TECHNIQUE: Contiguous axial images were obtained from the base of the skull through the vertex without intravenous contrast. COMPARISON:  MRI brain 09/28/2006 FINDINGS: Brain: No evidence of acute infarction, hemorrhage, hydrocephalus, extra-axial collection or mass lesion/mass effect. Vascular: No hyperdense vessel  or unexpected calcification. Skull: Normal. Negative for fracture or focal lesion. Sinuses/Orbits: No acute finding. Other: None. IMPRESSION: Normal head CT. Electronically Signed   By: Marin Olp M.D.   On: 12/20/2017 18:27   Dg Chest Portable 1 View  Result Date: 12/20/2017 CLINICAL DATA:  Pt blacked out on the toilet today. Hx of atrial tachycardia, mitral valve prolapse. Non smoker EXAM: PORTABLE CHEST 1 VIEW COMPARISON:  None. FINDINGS: Heart size and mediastinal contours are within normal limits. Atherosclerotic changes noted at the aortic arch. Lungs appear hyperexpanded. Chronic bronchitic changes noted centrally. Chronic scarring/fibrosis at each lung apex. Lungs otherwise clear. No pleural effusion or pneumothorax seen. Osseous structures about the chest are unremarkable. IMPRESSION: 1. No active disease.  No evidence of pneumonia or pulmonary edema. 2. Hyperexpanded lungs suggesting COPD/emphysema. 3. Chronic bronchitic changes. 4. Aortic atherosclerosis. Electronically Signed   By: Franki Cabot M.D.   On: 12/20/2017 17:49   Dg Hip Unilat W Or Wo Pelvis 2-3 Views Left  Result Date: 12/20/2017 CLINICAL DATA:  77 year old female with left hip pain after passing out on the toilet EXAM:  DG HIP (WITH OR WITHOUT PELVIS) 2-3V LEFT COMPARISON:  None. FINDINGS: There is no evidence of hip fracture or dislocation. There is no evidence of arthropathy or other focal bone abnormality. IMPRESSION: Negative. Electronically Signed   By: Jacqulynn Cadet M.D.   On: 12/20/2017 16:53   x-rays of the chest and hip show no acute disease ___CT of the head is read as normal as well_________________________________________   PROCEDURES  Procedure(s) performed:   Procedures  Critical Care performed:   ____________________________________________   INITIAL IMPRESSION / ASSESSMENT AND PLAN / ED COURSE patient's workup here is essentially normal. She has had no further diarrhea. I will try the air cast and she says that the lace up splint orthopedics gave her made her ankle worse. We'll have her follow-up with them for the ankle. will also have her follow-up with her regular doctor. She will return here if she is worse.I believe she had a vasovagal episode brought on by the very large diarrheal stool she had    patient will spend the night with her daughter-in-law. She has been walking on her foot and ankle most the day before she got here. She did that well this was after her fall. I think her leg pain is mostly muscle strain and ligament injury.   ____________________________________________   FINAL CLINICAL IMPRESSION(S) / ED DIAGNOSES  Final diagnoses:  Syncope and collapse     ED Discharge Orders    None       Note:  This document was prepared using Dragon voice recognition software and may include unintentional dictation errors.    Nena Polio, MD 12/20/17 Kathaleen Maser    Nena Polio, MD 12/20/17 [phone]

## 2017-12-20 NOTE — Telephone Encounter (Signed)
Patient stated she will not go to an UC/ED due to seeing the orthopedic about her foot.  She tore a ligament.  Patient stated she feels back to normal except her leg.  Also she will not schedule appointment until later date.

## 2017-12-20 NOTE — ED Notes (Signed)
PT returned from CT at this time

## 2017-12-20 NOTE — ED Notes (Signed)
Pt taken to car via [place]. She is able to use her cane and walk on heel of left foot to get into POV. NAD. VSS.

## 2017-12-27 ENCOUNTER — Telehealth: Payer: Self-pay

## 2017-12-27 NOTE — Telephone Encounter (Signed)
Copied from [place]. Topic: Quick Communication - See Telephone Encounter >> Dec 27, 2017  3:25 PM Oneta Rack wrote: CRM for notification. See Telephone encounter for:  12/27/17. >> Dec 27, 2017  3:25 PM Oneta Rack wrote: Past last seen 10/24/2017 and states Medicare didn't cover due to bill not being coded as AWV. Explained to patient she had her AWV appointment with the health coach 07/20/17 patient denied, please advise.

## 2017-12-28 NOTE — Telephone Encounter (Signed)
Please advise 

## 2017-12-28 NOTE — Telephone Encounter (Signed)
I spoke with the patient her information has been sent to [facility] to review coding.

## 2018-01-08 NOTE — Progress Notes (Signed)
Cardiology Office Note    Date:  01/09/2018   ID:  SAM WUNSCHEL, DOB 07-10-1941, MRN [id number]  PCP:  Einar Pheasant, MD  Cardiologist: Sinclair Grooms, MD   Chief Complaint  Patient presents with  . Palpitations    History of Present Illness:  Jody Howard is a 77 y.o. female with history of paroxysmal atrial tachycardia.  Seen back in cardiology clinic today because of an episode of syncope occurring on 12/20/2017.  She was seen at [facility]  Overall doing well.  Was having palpitations earlier in the year but figured out it was related to caffeine.  She eliminated caffeine and palpitations have resolved.  She has had no sustained tachycardia/racing palpitation since her ablation greater than 15 years ago.   Past Medical History:  Diagnosis Date  . Anemia   . Atrial tachycardia (HCC)    s/p ablation  . Clostridium difficile infection 1997   required hospitalization  . Hyperlipidemia   . Mitral valve prolapse   . Paroxysmal supraventricular tachycardia ([place])   . Rheumatic fever     Past Surgical History:  Procedure Laterality Date  . ABLATION     for atrial tachycardia  . BREAST BIOPSY  1997  . TONSILLECTOMY  1944    Current Medications: Outpatient Medications Prior to Visit  Medication Sig Dispense Refill  . CALCIUM PO Take 1 tablet by mouth daily.     . Cholecalciferol (VITAMIN D-3 PO) Take 1 capsule by mouth daily. Taking 2000 IU daily    . CRESTOR 5 MG tablet TAKE 1/2 TABLET(2.5 MG) BY MOUTH DAILY 30 tablet 5  . meloxicam (MOBIC) 15 MG tablet Take 1 tablet by mouth daily.    . Multiple Vitamins-Minerals (MULTIVITAMIN PO) Take 1 tablet by mouth daily.     . rosuvastatin (CRESTOR) 5 MG tablet Name brand only 90 tablet 3   No facility-administered medications prior to visit.      Allergies:   Codeine; Lipitor [atorvastatin]; Simvastatin; Other; Silicone; and Flagyl [metronidazole]   Social History   Socioeconomic History  .  Marital status: Widowed    Spouse name: None  . Number of children: 2  . Years of education: None  . Highest education level: None  Social Needs  . Financial resource strain: None  . Food insecurity - worry: None  . Food insecurity - inability: None  . Transportation needs - medical: None  . Transportation needs - non-medical: None  Occupational History  . None  Tobacco Use  . Smoking status: Never Smoker  . Smokeless tobacco: Never Used  Substance and Sexual Activity  . Alcohol use: Yes    Alcohol/week: 0.0 oz    Comment: occasional  . Drug use: No  . Sexual activity: No  Other Topics Concern  . None  Social History Narrative  . None     Family History:  The patient's family history includes Breast cancer in her sister; Cancer in her mother, paternal aunt, and sister; Heart attack in her sister; Multiple myeloma in her father.   ROS:   Please see the history of present illness.    70 irregular/skipped heartbeats November December stop drinking coffee.  This led to resolution.  Had a syncopal episode in her voluminous diarrhea, prodrome of diaphoresis and weakness.  Felt to be vasovagal. All other systems reviewed and are negative.   PHYSICAL EXAM:   VS:  BP (!) 122/58   Pulse 64   Ht 5'  4" (1.626 m)   Wt 121 lb 3.2 oz (55 kg)   LMP 01/29/1983   SpO2 98%   BMI 20.80 kg/m    GEN: Well nourished, well developed, in no acute distress  HEENT: normal  Neck: no JVD, carotid bruits, or masses Cardiac: RRR; no murmurs, rubs, or gallops,no edema  Respiratory:  clear to auscultation bilaterally, normal work of breathing GI: soft, nontender, nondistended, + BS MS: no deformity or atrophy  Skin: warm and dry, no rash Neuro:  Alert and Oriented x 3, Strength and sensation are intact Psych: euthymic mood, full affect  Wt Readings from Last 3 Encounters:  01/09/18 121 lb 3.2 oz (55 kg)  12/20/17 111 lb (50.3 kg)  10/24/17 116 lb 6.4 oz (52.8 kg)      Studies/Labs  Reviewed:   EKG:  EKG performed December 20, 2017 in the emergency department after syncopal episode revealed normal sinus rhythm with no significant abnormality.  Recent Labs: 10/21/2017: ALT 12; TSH 2.12 12/20/2017: BUN 27; Creatinine, Ser 0.86; Hemoglobin 12.6; Platelets 343; Potassium 4.3; Sodium 142   Lipid Panel    Component Value Date/Time   CHOL 201 (H) 10/21/2017 0808   TRIG 56.0 10/21/2017 0808   HDL 60.00 10/21/2017 0808   CHOLHDL 3 10/21/2017 0808   VLDL 11.2 10/21/2017 0808   LDLCALC 130 (H) 10/21/2017 0808    Additional studies/ records that were reviewed today include:  None    ASSESSMENT:    1. Atrial tachycardia ([place])   2. Pure hypercholesterolemia      PLAN:  In order of problems listed above:  1. Has not recurred 2. Elevated but without significant risk factors for obstructive CAD. 3. The recent episode was associated with diarrhea prodrome of sweating and weakness.  No recurrence.  No further workup is needed.    Medication Adjustments/Labs and Tests Ordered: Current medicines are reviewed at length with the patient today.  Concerns regarding medicines are outlined above.  Medication changes, Labs and Tests ordered today are listed in the Patient Instructions below. There are no Patient Instructions on file for this visit.   Signed, Sinclair Grooms, MD  01/09/2018 4:02 PM    [facility] [place], [place], [place]  [address] Phone: [phone]; Fax: [phone]

## 2018-01-09 ENCOUNTER — Ambulatory Visit (INDEPENDENT_AMBULATORY_CARE_PROVIDER_SITE_OTHER): Payer: Medicare Other | Admitting: Interventional Cardiology

## 2018-01-09 ENCOUNTER — Encounter: Payer: Self-pay | Admitting: Interventional Cardiology

## 2018-01-09 VITALS — BP 122/58 | HR 64 | Ht 64.0 in | Wt 121.2 lb

## 2018-01-09 DIAGNOSIS — I471 Supraventricular tachycardia: Secondary | ICD-10-CM | POA: Diagnosis not present

## 2018-01-09 DIAGNOSIS — R55 Syncope and collapse: Secondary | ICD-10-CM | POA: Diagnosis not present

## 2018-01-09 DIAGNOSIS — E7800 Pure hypercholesterolemia, unspecified: Secondary | ICD-10-CM

## 2018-01-09 NOTE — Patient Instructions (Signed)
Medication Instructions:  Your physician recommends that you continue on your current medications as directed. Please refer to the Current Medication list given to you today.  Labwork: None  Testing/Procedures: None  Follow-Up: Your physician recommends that you schedule a follow-up appointment as needed with Dr. Smith.     Any Other Special Instructions Will Be Listed Below (If Applicable).     If you need a refill on your cardiac medications before your next appointment, please call your pharmacy.   

## 2018-02-02 DIAGNOSIS — M7542 Impingement syndrome of left shoulder: Secondary | ICD-10-CM | POA: Diagnosis not present

## 2018-02-03 DIAGNOSIS — Z803 Family history of malignant neoplasm of breast: Secondary | ICD-10-CM | POA: Diagnosis not present

## 2018-02-03 DIAGNOSIS — Z1231 Encounter for screening mammogram for malignant neoplasm of breast: Secondary | ICD-10-CM | POA: Diagnosis not present

## 2018-02-14 DIAGNOSIS — M25612 Stiffness of left shoulder, not elsewhere classified: Secondary | ICD-10-CM | POA: Diagnosis not present

## 2018-02-14 DIAGNOSIS — M25512 Pain in left shoulder: Secondary | ICD-10-CM | POA: Diagnosis not present

## 2018-03-01 DIAGNOSIS — R609 Edema, unspecified: Secondary | ICD-10-CM | POA: Diagnosis not present

## 2018-03-01 DIAGNOSIS — M25612 Stiffness of left shoulder, not elsewhere classified: Secondary | ICD-10-CM | POA: Diagnosis not present

## 2018-03-01 DIAGNOSIS — M25512 Pain in left shoulder: Secondary | ICD-10-CM | POA: Diagnosis not present

## 2018-03-02 DIAGNOSIS — N813 Complete uterovaginal prolapse: Secondary | ICD-10-CM | POA: Diagnosis not present

## 2018-03-03 DIAGNOSIS — M25512 Pain in left shoulder: Secondary | ICD-10-CM | POA: Diagnosis not present

## 2018-03-03 DIAGNOSIS — M25612 Stiffness of left shoulder, not elsewhere classified: Secondary | ICD-10-CM | POA: Diagnosis not present

## 2018-03-03 DIAGNOSIS — R600 Localized edema: Secondary | ICD-10-CM | POA: Diagnosis not present

## 2018-03-07 DIAGNOSIS — R609 Edema, unspecified: Secondary | ICD-10-CM | POA: Diagnosis not present

## 2018-03-07 DIAGNOSIS — M25612 Stiffness of left shoulder, not elsewhere classified: Secondary | ICD-10-CM | POA: Diagnosis not present

## 2018-03-07 DIAGNOSIS — M25512 Pain in left shoulder: Secondary | ICD-10-CM | POA: Diagnosis not present

## 2018-03-10 DIAGNOSIS — M25512 Pain in left shoulder: Secondary | ICD-10-CM | POA: Diagnosis not present

## 2018-03-10 DIAGNOSIS — M25612 Stiffness of left shoulder, not elsewhere classified: Secondary | ICD-10-CM | POA: Diagnosis not present

## 2018-03-10 DIAGNOSIS — R609 Edema, unspecified: Secondary | ICD-10-CM | POA: Diagnosis not present

## 2018-03-13 DIAGNOSIS — M25512 Pain in left shoulder: Secondary | ICD-10-CM | POA: Diagnosis not present

## 2018-03-13 DIAGNOSIS — R609 Edema, unspecified: Secondary | ICD-10-CM | POA: Diagnosis not present

## 2018-03-13 DIAGNOSIS — M25612 Stiffness of left shoulder, not elsewhere classified: Secondary | ICD-10-CM | POA: Diagnosis not present

## 2018-03-15 DIAGNOSIS — N952 Postmenopausal atrophic vaginitis: Secondary | ICD-10-CM | POA: Diagnosis not present

## 2018-03-15 DIAGNOSIS — Z4689 Encounter for fitting and adjustment of other specified devices: Secondary | ICD-10-CM | POA: Diagnosis not present

## 2018-03-15 DIAGNOSIS — N813 Complete uterovaginal prolapse: Secondary | ICD-10-CM | POA: Diagnosis not present

## 2018-03-15 DIAGNOSIS — N3946 Mixed incontinence: Secondary | ICD-10-CM | POA: Diagnosis not present

## 2018-03-16 DIAGNOSIS — N813 Complete uterovaginal prolapse: Secondary | ICD-10-CM | POA: Diagnosis not present

## 2018-03-16 DIAGNOSIS — N3946 Mixed incontinence: Secondary | ICD-10-CM | POA: Diagnosis not present

## 2018-03-24 DIAGNOSIS — M25512 Pain in left shoulder: Secondary | ICD-10-CM | POA: Diagnosis not present

## 2018-03-24 DIAGNOSIS — R609 Edema, unspecified: Secondary | ICD-10-CM | POA: Diagnosis not present

## 2018-03-24 DIAGNOSIS — M25612 Stiffness of left shoulder, not elsewhere classified: Secondary | ICD-10-CM | POA: Diagnosis not present

## 2018-04-18 DIAGNOSIS — N3946 Mixed incontinence: Secondary | ICD-10-CM | POA: Diagnosis not present

## 2018-04-18 DIAGNOSIS — N813 Complete uterovaginal prolapse: Secondary | ICD-10-CM | POA: Diagnosis not present

## 2018-05-08 ENCOUNTER — Other Ambulatory Visit: Payer: Medicare Other

## 2018-05-10 ENCOUNTER — Ambulatory Visit (INDEPENDENT_AMBULATORY_CARE_PROVIDER_SITE_OTHER): Payer: Medicare Other | Admitting: Internal Medicine

## 2018-05-10 ENCOUNTER — Encounter: Payer: Self-pay | Admitting: Internal Medicine

## 2018-05-10 VITALS — BP 102/60 | HR 70 | Temp 97.6°F | Resp 18 | Ht 64.0 in | Wt 112.8 lb

## 2018-05-10 DIAGNOSIS — Z1231 Encounter for screening mammogram for malignant neoplasm of breast: Secondary | ICD-10-CM | POA: Diagnosis not present

## 2018-05-10 DIAGNOSIS — I471 Supraventricular tachycardia: Secondary | ICD-10-CM | POA: Diagnosis not present

## 2018-05-10 DIAGNOSIS — Z803 Family history of malignant neoplasm of breast: Secondary | ICD-10-CM | POA: Diagnosis not present

## 2018-05-10 DIAGNOSIS — Z1239 Encounter for other screening for malignant neoplasm of breast: Secondary | ICD-10-CM

## 2018-05-10 DIAGNOSIS — Z0000 Encounter for general adult medical examination without abnormal findings: Secondary | ICD-10-CM | POA: Diagnosis not present

## 2018-05-10 DIAGNOSIS — F439 Reaction to severe stress, unspecified: Secondary | ICD-10-CM

## 2018-05-10 DIAGNOSIS — E7800 Pure hypercholesterolemia, unspecified: Secondary | ICD-10-CM | POA: Diagnosis not present

## 2018-05-10 DIAGNOSIS — J479 Bronchiectasis, uncomplicated: Secondary | ICD-10-CM

## 2018-05-10 DIAGNOSIS — N8110 Cystocele, unspecified: Secondary | ICD-10-CM

## 2018-05-10 LAB — BASIC METABOLIC PANEL
BUN: 21 mg/dL (ref 6–23)
CHLORIDE: 106 meq/L (ref 96–112)
CO2: 30 meq/L (ref 19–32)
Calcium: 9.8 mg/dL (ref 8.4–10.5)
Creatinine, Ser: 0.93 mg/dL (ref 0.40–1.20)
GFR: 62.21 mL/min (ref >60.00)
Glucose, Bld: 96 mg/dL (ref 70–99)
Potassium: 4.5 mEq/L (ref 3.5–5.1)
SODIUM: 142 meq/L (ref 135–145)

## 2018-05-10 LAB — LIPID PANEL
CHOL/HDL RATIO: 3
Cholesterol: 188 mg/dL (ref 0–200)
HDL: 60.2 mg/dL (ref >39.00)
LDL CALC: 115 mg/dL — AB (ref 0–99)
NonHDL: 127.34
TRIGLYCERIDES: 63 mg/dL (ref 0.0–149.0)
VLDL: 12.6 mg/dL (ref 0.0–40.0)

## 2018-05-10 LAB — HEPATIC FUNCTION PANEL
ALBUMIN: 4.1 g/dL (ref 3.5–5.2)
ALT: 10 U/L (ref 0–35)
AST: 18 U/L (ref 0–37)
Alkaline Phosphatase: 58 U/L (ref 39–117)
Bilirubin, Direct: 0.1 mg/dL (ref 0.0–0.3)
TOTAL PROTEIN: 6.6 g/dL (ref 6.0–8.3)
Total Bilirubin: 0.6 mg/dL (ref 0.2–1.2)

## 2018-05-10 LAB — CBC WITH DIFFERENTIAL/PLATELET
BASOS ABS: 0.1 10*3/uL (ref 0.0–0.1)
Basophils Relative: 1.1 % (ref 0.0–3.0)
Eosinophils Absolute: 0.3 10*3/uL (ref 0.0–0.7)
Eosinophils Relative: 5.6 % — ABNORMAL HIGH (ref 0.0–5.0)
HEMATOCRIT: 38.4 % (ref 36.0–46.0)
Hemoglobin: 12.8 g/dL (ref 12.0–15.0)
LYMPHS PCT: 19.9 % (ref 12.0–46.0)
Lymphs Abs: 1.1 10*3/uL (ref 0.7–4.0)
MCHC: 33.3 g/dL (ref 30.0–36.0)
MCV: 94.4 fl (ref 78.0–100.0)
MONOS PCT: 9.4 % (ref 3.0–12.0)
Monocytes Absolute: 0.5 10*3/uL (ref 0.1–1.0)
NEUTROS PCT: 64 % (ref 43.0–77.0)
Neutro Abs: 3.6 10*3/uL (ref 1.4–7.7)
Platelets: 316 10*3/uL (ref 150.0–400.0)
RBC: 4.07 Mil/uL (ref 3.87–5.11)
RDW: 13.4 % (ref 11.5–15.5)
WBC: 5.6 10*3/uL (ref 4.0–10.5)

## 2018-05-10 NOTE — Progress Notes (Signed)
Patient ID: DARRYL BLUMENSTEIN, female   DOB: 1940/12/16, 77 y.o.   MRN: [id number]   Subjective:    Patient ID: Renita Papa, female    DOB: [birth year], 77 y.o.   MRN: [id number]  HPI  Patient with past history of atrial tachycardia s/p ablation and hypercholesterolemia.  She comes in today to follow up on these issues as well as for a complete physical exam.    Has done well since her ablation.  Recently evaluated by Dr Tamala Julian 01/09/18.  Felt stable.  She stays active.  No chest pain.  No increased heart rate or palpitations.  No sob.  No acid reflux.  No abdominal pain.  Bowels moving.  Overall weight is stable.  Some increased stress with family issues.  Discussed with her today.  She desires no further intervention.  Feels she is handling things relatively well.  She has two sisters with breast cancer.  Recently evaluated by urogynecology.  Discusses surgery and removal of ovaries.  She is not sure how she wants to proceed.  Discussed genetic testing and counseling given family history.  She is in agreement.  Overall she feels she is doing well.     Past Medical History:  Diagnosis Date  . Anemia   . Atrial tachycardia (HCC)    s/p ablation  . Clostridium difficile infection 1997   required hospitalization  . Hyperlipidemia   . Mitral valve prolapse   . Paroxysmal supraventricular tachycardia ([place])   . Rheumatic fever    Past Surgical History:  Procedure Laterality Date  . ABLATION     for atrial tachycardia  . BREAST BIOPSY  1997  . TONSILLECTOMY  1944   Family History  Problem Relation Age of Onset  . Cancer Mother        hx of breast cancer  . Cancer Paternal Aunt        colon cancer  . Multiple myeloma Father   . Breast cancer Sister   . Cancer Sister        Breast  . Heart attack Sister        due to iodine reaction   Social History   Socioeconomic History  . Marital status: Widowed    Spouse name: Not on file  . Number of children: 2  . Years of education: Not on  file  . Highest education level: Not on file  Occupational History  . Not on file  Social Needs  . Financial resource strain: Not on file  . Food insecurity:    Worry: Not on file    Inability: Not on file  . Transportation needs:    Medical: Not on file    Non-medical: Not on file  Tobacco Use  . Smoking status: Never Smoker  . Smokeless tobacco: Never Used  Substance and Sexual Activity  . Alcohol use: Yes    Alcohol/week: 0.0 oz    Comment: occasional  . Drug use: No  . Sexual activity: Never  Lifestyle  . Physical activity:    Days per week: Not on file    Minutes per session: Not on file  . Stress: Not on file  Relationships  . Social connections:    Talks on phone: Not on file    Gets together: Not on file    Attends religious service: Not on file    Active member of club or organization: Not on file    Attends meetings of clubs or organizations: Not on file  Relationship status: Not on file  Other Topics Concern  . Not on file  Social History Narrative  . Not on file    Outpatient Encounter Medications as of 05/10/2018  Medication Sig  . CALCIUM PO Take 1 tablet by mouth daily.   . Cholecalciferol (VITAMIN D-3 PO) Take 1 capsule by mouth daily. Taking 2000 IU daily  . CRESTOR 5 MG tablet TAKE 1/2 TABLET(2.5 MG) BY MOUTH DAILY  . meloxicam (MOBIC) 15 MG tablet Take 1 tablet by mouth daily.  . Multiple Vitamins-Minerals (MULTIVITAMIN PO) Take 1 tablet by mouth daily.   . [DISCONTINUED] rosuvastatin (CRESTOR) 5 MG tablet Name brand only   No facility-administered encounter medications on file as of 05/10/2018.     Review of Systems  Constitutional: Negative for appetite change and unexpected weight change.       Weight is overall stable.   HENT: Negative for congestion and sinus pressure.   Eyes: Negative for pain and visual disturbance.  Respiratory: Negative for cough, chest tightness and shortness of breath.   Cardiovascular: Negative for chest pain,  palpitations and leg swelling.  Gastrointestinal: Negative for abdominal pain, diarrhea, nausea and vomiting.  Genitourinary: Negative for difficulty urinating and dysuria.  Musculoskeletal: Negative for joint swelling and myalgias.  Skin: Negative for color change and rash.  Neurological: Negative for dizziness, light-headedness and headaches.  Hematological: Negative for adenopathy. Does not bruise/bleed easily.  Psychiatric/Behavioral: Negative for agitation and dysphoric mood.       Objective:    Physical Exam  Constitutional: She is oriented to person, place, and time. She appears well-developed and well-nourished. No distress.  HENT:  Nose: Nose normal.  Mouth/Throat: Oropharynx is clear and moist.  Eyes: Right eye exhibits no discharge. Left eye exhibits no discharge. No scleral icterus.  Neck: Neck supple. No thyromegaly present.  Cardiovascular: Normal rate and regular rhythm.  Pulmonary/Chest: Breath sounds normal. No accessory muscle usage. No tachypnea. No respiratory distress. She has no decreased breath sounds. She has no wheezes. She has no rhonchi. Right breast exhibits no inverted nipple, no mass, no nipple discharge and no tenderness (no axillary adenopathy). Left breast exhibits no inverted nipple, no mass, no nipple discharge and no tenderness (no axilarry adenopathy).  Abdominal: Soft. Bowel sounds are normal. There is no tenderness.  Musculoskeletal: She exhibits no edema or tenderness.  Lymphadenopathy:    She has no cervical adenopathy.  Neurological: She is alert and oriented to person, place, and time.  Skin: No rash noted. No erythema.  Psychiatric: She has a normal mood and affect. Her behavior is normal.    BP 102/60 (BP Location: Left Arm, Patient Position: Sitting, Cuff Size: Normal)   Pulse 70   Temp 97.6 F (36.4 C) (Oral)   Resp 18   Ht '5\' 4"'$  (1.626 m)   Wt 112 lb 12.8 oz (51.2 kg)   LMP 01/29/1983   SpO2 98%   BMI 19.36 kg/m  Wt Readings  from Last 3 Encounters:  05/10/18 112 lb 12.8 oz (51.2 kg)  01/09/18 121 lb 3.2 oz (55 kg)  12/20/17 111 lb (50.3 kg)     Lab Results  Component Value Date   WBC 5.6 05/10/2018   HGB 12.8 05/10/2018   HCT 38.4 05/10/2018   PLT 316.0 05/10/2018   GLUCOSE 96 05/10/2018   CHOL 188 05/10/2018   TRIG 63.0 05/10/2018   HDL 60.20 05/10/2018   LDLCALC 115 (H) 05/10/2018   ALT 10 05/10/2018   AST 18 05/10/2018  NA 142 05/10/2018   K 4.5 05/10/2018   CL 106 05/10/2018   CREATININE 0.93 05/10/2018   BUN 21 05/10/2018   CO2 30 05/10/2018   TSH 2.12 10/21/2017    Ct Head Wo Contrast  Result Date: 12/20/2017 CLINICAL DATA:  Syncopal episode with fall. EXAM: CT HEAD WITHOUT CONTRAST TECHNIQUE: Contiguous axial images were obtained from the base of the skull through the vertex without intravenous contrast. COMPARISON:  MRI brain 09/28/2006 FINDINGS: Brain: No evidence of acute infarction, hemorrhage, hydrocephalus, extra-axial collection or mass lesion/mass effect. Vascular: No hyperdense vessel or unexpected calcification. Skull: Normal. Negative for fracture or focal lesion. Sinuses/Orbits: No acute finding. Other: None. IMPRESSION: Normal head CT. Electronically Signed   By: Marin Olp M.D.   On: 12/20/2017 18:27   Dg Chest Portable 1 View  Result Date: 12/20/2017 CLINICAL DATA:  Pt blacked out on the toilet today. Hx of atrial tachycardia, mitral valve prolapse. Non smoker EXAM: PORTABLE CHEST 1 VIEW COMPARISON:  None. FINDINGS: Heart size and mediastinal contours are within normal limits. Atherosclerotic changes noted at the aortic arch. Lungs appear hyperexpanded. Chronic bronchitic changes noted centrally. Chronic scarring/fibrosis at each lung apex. Lungs otherwise clear. No pleural effusion or pneumothorax seen. Osseous structures about the chest are unremarkable. IMPRESSION: 1. No active disease.  No evidence of pneumonia or pulmonary edema. 2. Hyperexpanded lungs suggesting  COPD/emphysema. 3. Chronic bronchitic changes. 4. Aortic atherosclerosis. Electronically Signed   By: Franki Cabot M.D.   On: 12/20/2017 17:49   Dg Hip Unilat W Or Wo Pelvis 2-3 Views Left  Result Date: 12/20/2017 CLINICAL DATA:  77 year old female with left hip pain after passing out on the toilet EXAM: DG HIP (WITH OR WITHOUT PELVIS) 2-3V LEFT COMPARISON:  None. FINDINGS: There is no evidence of hip fracture or dislocation. There is no evidence of arthropathy or other focal bone abnormality. IMPRESSION: Negative. Electronically Signed   By: Jacqulynn Cadet M.D.   On: 12/20/2017 16:53       Assessment & Plan:   Problem List Items Addressed This Visit    Atrial tachycardia ([place])    S/p ablation.  Has done well.  Evaluated by cardiology 01/2018.  Stable.  Follow.       Bronchiectasis ([place])    Mild bronchiectasis.  Previously followed by Dr Madalyn Rob.  Stable.  Breathing doing well.  Follow.        Family history of breast cancer    Has two sisters with breast cancer.  See note. Also in process of deciding about surgical options for her bladder and question of removing ovaries.  Discussed genetic counseling and testing.  She is in agreement for referral.        Female bladder prolapse    Evaluated by uro/gyn.  Note reviewed.  Thinking about her options.        Health care maintenance    Physical today 05/10/18.  Schedule mammogram.  Colonoscopy 2010.  Will give hemoccult cards.        Pure hypercholesterolemia    On crestor.  Follow lipid panel and liver function tests.        Stress    Increased stress as outlined.  Discussed with her today.  Discussed referral. She declines.  Follow.        Other Visit Diagnoses    Breast cancer screening    -  Primary   Relevant Orders   MM 3D SCREEN BREAST BILATERAL       Quanah Majka,  MD

## 2018-05-10 NOTE — Assessment & Plan Note (Addendum)
Physical today 05/10/18.  Schedule mammogram.  Colonoscopy 2010.  Will give hemoccult cards.

## 2018-05-12 DIAGNOSIS — Z23 Encounter for immunization: Secondary | ICD-10-CM | POA: Diagnosis not present

## 2018-05-12 DIAGNOSIS — Z029 Encounter for administrative examinations, unspecified: Secondary | ICD-10-CM | POA: Diagnosis not present

## 2018-05-13 ENCOUNTER — Encounter: Payer: Self-pay | Admitting: Internal Medicine

## 2018-05-13 NOTE — Assessment & Plan Note (Signed)
Has two sisters with breast cancer.  See note. Also in process of deciding about surgical options for her bladder and question of removing ovaries.  Discussed genetic counseling and testing.  She is in agreement for referral.

## 2018-05-13 NOTE — Assessment & Plan Note (Signed)
S/p ablation.  Has done well.  Evaluated by cardiology 01/2018.  Stable.  Follow.

## 2018-05-13 NOTE — Assessment & Plan Note (Signed)
Increased stress as outlined.  Discussed with her today.  Discussed referral. She declines.  Follow.

## 2018-05-13 NOTE — Assessment & Plan Note (Signed)
Evaluated by uro/gyn.  Note reviewed.  Thinking about her options.

## 2018-05-13 NOTE — Assessment & Plan Note (Signed)
On crestor.  Follow lipid panel and liver function tests.   

## 2018-05-13 NOTE — Assessment & Plan Note (Signed)
Mild bronchiectasis.  Previously followed by Dr Madalyn Rob.  Stable.  Breathing doing well.  Follow.

## 2018-05-18 ENCOUNTER — Ambulatory Visit: Payer: Medicare Other

## 2018-05-22 ENCOUNTER — Other Ambulatory Visit: Payer: Self-pay

## 2018-05-22 ENCOUNTER — Emergency Department
Admission: EM | Admit: 2018-05-22 | Discharge: 2018-05-22 | Disposition: A | Discharge status: Home / Self Care | Payer: Medicare Other | Attending: Emergency Medicine | Admitting: Emergency Medicine

## 2018-05-22 ENCOUNTER — Encounter: Payer: Self-pay | Admitting: Intensive Care

## 2018-05-22 DIAGNOSIS — F22 Delusional disorders: Secondary | ICD-10-CM | POA: Insufficient documentation

## 2018-05-22 DIAGNOSIS — R1111 Vomiting without nausea: Secondary | ICD-10-CM | POA: Diagnosis not present

## 2018-05-22 DIAGNOSIS — Z79899 Other long term (current) drug therapy: Secondary | ICD-10-CM | POA: Insufficient documentation

## 2018-05-22 DIAGNOSIS — R55 Syncope and collapse: Secondary | ICD-10-CM | POA: Insufficient documentation

## 2018-05-22 DIAGNOSIS — T675XXA Heat exhaustion, unspecified, initial encounter: Secondary | ICD-10-CM | POA: Diagnosis not present

## 2018-05-22 LAB — CBC WITH DIFFERENTIAL/PLATELET
BASOS PCT: 1 %
Basophils Absolute: 0.1 10*3/uL (ref 0–0.1)
EOS ABS: 0.2 10*3/uL (ref 0–0.7)
Eosinophils Relative: 3 %
HCT: 37.3 % (ref 35.0–47.0)
HEMOGLOBIN: 12.4 g/dL (ref 12.0–16.0)
Lymphocytes Relative: 17 %
Lymphs Abs: 1 10*3/uL (ref 1.0–3.6)
MCH: 31.3 pg (ref 26.0–34.0)
MCHC: 33.2 g/dL (ref 32.0–36.0)
MCV: 94.2 fL (ref 80.0–100.0)
Monocytes Absolute: 0.5 10*3/uL (ref 0.2–0.9)
Monocytes Relative: 9 %
NEUTROS PCT: 70 %
Neutro Abs: 4.3 10*3/uL (ref 1.4–6.5)
Platelets: 312 10*3/uL (ref 150–440)
RBC: 3.96 MIL/uL (ref 3.80–5.20)
RDW: 13.5 % (ref 11.5–14.5)
WBC: 6 10*3/uL (ref 3.6–11.0)

## 2018-05-22 LAB — BASIC METABOLIC PANEL
Anion gap: 10 (ref 5–15)
BUN: 28 mg/dL — ABNORMAL HIGH (ref 6–20)
CALCIUM: 9.4 mg/dL (ref 8.9–10.3)
CO2: 27 mmol/L (ref 22–32)
CREATININE: 1.06 mg/dL — AB (ref 0.44–1.00)
Chloride: 106 mmol/L (ref 101–111)
GFR, EST AFRICAN AMERICAN: 58 mL/min — AB (ref >60)
GFR, EST NON AFRICAN AMERICAN: 50 mL/min — AB (ref >60)
Glucose, Bld: 112 mg/dL — ABNORMAL HIGH (ref 65–99)
Potassium: 3.6 mmol/L (ref 3.5–5.1)
SODIUM: 143 mmol/L (ref 135–145)

## 2018-05-22 LAB — TROPONIN I

## 2018-05-22 MED ORDER — SODIUM CHLORIDE 0.9 % IV BOLUS
1000.0000 mL | Freq: Once | INTRAVENOUS | Status: AC
  Administered 2018-05-22: 1000 mL via INTRAVENOUS

## 2018-05-22 NOTE — ED Notes (Signed)
ED Provider at bedside. 

## 2018-05-22 NOTE — ED Provider Notes (Signed)
[facility] Emergency Department Provider Note ____________________________________________   First MD Initiated Contact with Patient 05/22/18 1358     (approximate)  I have reviewed the triage vital signs and the nursing notes.   HISTORY  Chief Complaint Heat Exposure    HPI Jody Howard is a 77 y.o. female with PMH as noted below who presents with syncope.  The patient states that she was standing outside for approximate hour taking out her garbage and talking to the neighbors, when she began to feel lightheaded and weak, and then syncopized.  She reported feeling nauseous and hot as well.  She states that she felt well earlier today, and has been at her baseline.  She states she did eat breakfast this morning.  She also is on an oral typhoid vaccine for travel, and states that she does not normally take medications and this may have contributed.  The patient also related to me that she is concerned about drones that are observing her house, as well as an abnormal electrical field in her house that she believes may be making her sick.  She states that this is because she knows things about other people in the community and is being targeted.  However, she states that she has no thoughts to hurt herself or anyone else and feels safe.   Past Medical History:  Diagnosis Date  . Anemia   . Atrial tachycardia (HCC)    s/p ablation  . Clostridium difficile infection 1997   required hospitalization  . Hyperlipidemia   . Mitral valve prolapse   . Paroxysmal supraventricular tachycardia ([place])   . Rheumatic fever     Patient Active Problem List   Diagnosis Date Noted  . Family history of breast cancer 10/24/2016  . Right hip pain 07/23/2015  . Stress 01/21/2015  . Health care maintenance 01/21/2015  . Female bladder prolapse 09/24/2014  . Vaginal bleeding 09/24/2014  . Abdominal pain, left lower quadrant 09/24/2014  . Left arm pain 04/07/2014  .  Dysuria 06/14/2013  . Vaginal prolapse 06/06/2013  . Pure hypercholesterolemia 02/11/2013  . Bronchiectasis ([place]) 02/11/2013  . Atrial tachycardia ([place]) 01/30/2010    Past Surgical History:  Procedure Laterality Date  . ABLATION     for atrial tachycardia  . BREAST BIOPSY  1997  . TONSILLECTOMY  1944    Prior to Admission medications   Medication Sig Start Date End Date Taking? Authorizing Provider  CALCIUM PO Take 1 tablet by mouth daily.     [provider]  Cholecalciferol (VITAMIN D-3 PO) Take 1 capsule by mouth daily. Taking 2000 IU daily    [provider]  CRESTOR 5 MG tablet TAKE 1/2 TABLET(2.5 MG) BY MOUTH DAILY 10/21/16   Einar Pheasant, MD  meloxicam (MOBIC) 15 MG tablet Take 1 tablet by mouth daily.    [provider]  Multiple Vitamins-Minerals (MULTIVITAMIN PO) Take 1 tablet by mouth daily.     [provider]    Allergies Codeine; Lipitor [atorvastatin]; Simvastatin; Other; Silicone; and Flagyl [metronidazole]  Family History  Problem Relation Age of Onset  . Cancer Mother        hx of breast cancer  . Cancer Paternal Aunt        colon cancer  . Multiple myeloma Father   . Breast cancer Sister   . Cancer Sister        Breast  . Heart attack Sister        due to  iodine reaction    Social History Social History   Tobacco Use  . Smoking status: Never Smoker  . Smokeless tobacco: Never Used  Substance Use Topics  . Alcohol use: Yes    Alcohol/week: 0.0 oz    Comment: occasional  . Drug use: No    Review of Systems  Constitutional: No fever. Eyes: No redness. ENT: No sore throat. Cardiovascular: Denies chest pain. Respiratory: Denies shortness of breath. Gastrointestinal: No vomiting or diarrhea.  Genitourinary: Negative for dysuria.  Musculoskeletal: Negative for back pain. Skin: Negative for rash. Neurological: Negative for headache.   ____________________________________________   PHYSICAL  EXAM:  VITAL SIGNS: ED Triage Vitals  Enc Vitals Group     BP 05/22/18 1354 (!) 100/52     Pulse Rate 05/22/18 1354 (!) 58     Resp 05/22/18 1354 16     Temp 05/22/18 1354 97.7 F (36.5 C)     Temp Source 05/22/18 1354 Oral     SpO2 05/22/18 1354 100 %     Weight 05/22/18 1356 111 lb (50.3 kg)     Height 05/22/18 1356 5' 4"  (1.626 m)     Head Circumference --      [place] --      Pain Score 05/22/18 1355 0     Pain Loc --      Pain Edu? --      Excl. in [place]? --     Constitutional: Alert and oriented. Well appearing and in no acute distress. Eyes: Conjunctivae are normal.  EOMI.  PERRLA. Head: Atraumatic. Nose: No congestion/rhinnorhea. Mouth/Throat: Mucous membranes are moist.   Neck: Normal range of motion.  Cardiovascular: Normal rate, regular rhythm. Grossly normal heart sounds.  Good peripheral circulation. Respiratory: Normal respiratory effort.  No retractions. Lungs CTAB. Gastrointestinal: Soft and nontender. No distention.  Genitourinary: No flank tenderness. Musculoskeletal: No lower extremity edema.  Extremities warm and well perfused.  Neurologic:  Normal speech and language. No gross focal neurologic deficits are appreciated. Motor and sensory intact in all extremities.  Normal coordination.  Skin:  Skin is warm and dry. No rash noted. Psychiatric: Speech and behavior are normal.  ____________________________________________   LABS (all labs ordered are listed, but only abnormal results are displayed)  Labs Reviewed  BASIC METABOLIC PANEL - Abnormal; Notable for the following components:      Result Value   Glucose, Bld 112 (*)    BUN 28 (*)    Creatinine, Ser 1.06 (*)    GFR calc non Af Amer 50 (*)    GFR calc Af Amer 58 (*)    All other components within normal limits  CBC WITH DIFFERENTIAL/PLATELET  TROPONIN I   ____________________________________________  EKG  ED ECG REPORT I, Arta Silence, the attending physician, personally viewed  and interpreted this ECG.  Date: 05/22/2018 EKG Time: 1402 Rate: 56 Rhythm: normal sinus rhythm QRS Axis: normal Intervals: normal ST/T Wave abnormalities: normal Narrative Interpretation: no evidence of acute ischemia  ____________________________________________  RADIOLOGY    ____________________________________________   PROCEDURES  Procedure(s) performed: No  Procedures  Critical Care performed: No ____________________________________________   INITIAL IMPRESSION / ASSESSMENT AND PLAN / ED COURSE  Pertinent labs & imaging results that were available during my care of the patient were reviewed by me and considered in my medical decision making (see chart for details).  77 year old female with PMH as noted above presents after a syncopal episode, with prodrome of lightheadedness, occurring after she had been standing outside  in the heat for approximately an hour.  Patient states that she ate normally today, but she did take a yellow fever oral vaccine and felt that this may have contributed.  On exam, the vital signs are normal except for borderline low BP (which patient states is baseline for her), and the remainder of the exam is unremarkable.  Neuro exam is nonfocal.  After obtaining the initial history, when the patient's son stepped out of the room, she began to describe thoughts that are consistent with paranoid delusions.  However, the patient was adamant that she had no desire to hurt herself or anyone else.  The patient's son, Dr. Pryor Ochoa, is one of our ENT physicians.  I had an extensive discussion with him as well.  He states that initially when he heard about what happened to his mother today he was concerned that maybe she is not eating tough due to her paranoid delusions, however based on further history with her and his discussion with her this does not seem to be the case.  Dr. Pryor Ochoa has been in the room with his mother for approximately the last 45 minutes,  and he stated to me that he feels his mother is at her baseline, with stable paranoid delusions.  We discussed the possible benefit of having a psychiatric consultation in the ED, although the patient currently does not appear to be a danger to self or others.  However, Dr. Pryor Ochoa agreed that given that she has at her baseline this would likely not be beneficial at this time.  In terms of the syncope, it is consistent with vasovagal episode, likely heat related.  I will obtain basic labs, give fluids, treat symptomatically and reassess.  ----------------------------------------- 4:37 PM on 05/22/2018 -----------------------------------------  On reassessment, the patient's blood pressure is improved.  Her vital signs are otherwise stable.  She continues to feel well and is asymptomatic.  Per her son, the patient had a few brief runs of tachycardia to the 110s 120s which appeared sinus on the monitor, but the patient was asymptomatic during these.  There is no evidence of any active arrhythmia.  I have had several further conversations with the patient and she has not expressed any further delusional type thoughts or demonstrated any other acute psychiatric symptoms.  The patient would like to go home.  At this time she is safe for discharge.  I discussed the results of the work-up and return precautions with the patient and her son, and they expressed understanding.   ____________________________________________   FINAL CLINICAL IMPRESSION(S) / ED DIAGNOSES  Final diagnoses:  Syncope, unspecified syncope type      NEW MEDICATIONS STARTED DURING THIS VISIT:  New Prescriptions   No medications on file     Note:  This document was prepared using Dragon voice recognition software and may include unintentional dictation errors.    Arta Silence, MD 05/22/18 [phone]

## 2018-05-22 NOTE — Discharge Instructions (Addendum)
Follow-up with your primary care doctor.  Return to the ER for new, worsening, or persistent lightheadedness, palpitations, difficulty breathing, recurrent episodes of passing out or feeling like you are about to pass out, or any other new or worsening symptoms that concern you.

## 2018-05-22 NOTE — ED Triage Notes (Addendum)
Arrived by EMS from home. Per pt she was standing outside talking with neighbors for about an hour and started feeling nauseas and having some emesis and collasped to the ground. Neighbors called EMS B/p 92/52 (patient reports b/p normally presents low). Blood sugar 145 with ems. Patient had about 300cc of bolus and 4 zofran. Patient reports she has been getting trx for typhoid fever.

## 2018-05-30 ENCOUNTER — Ambulatory Visit
Admission: RE | Admit: 2018-05-30 | Discharge: 2018-05-30 | Disposition: A | Discharge status: Home / Self Care | Payer: Medicare Other | Source: Ambulatory Visit | Attending: Internal Medicine | Admitting: Internal Medicine

## 2018-05-30 ENCOUNTER — Ambulatory Visit: Payer: Medicare Other

## 2018-05-30 DIAGNOSIS — Z1239 Encounter for other screening for malignant neoplasm of breast: Secondary | ICD-10-CM

## 2018-05-30 DIAGNOSIS — Z1231 Encounter for screening mammogram for malignant neoplasm of breast: Secondary | ICD-10-CM | POA: Diagnosis not present

## 2018-06-05 ENCOUNTER — Other Ambulatory Visit: Payer: Self-pay | Admitting: Internal Medicine

## 2018-06-05 ENCOUNTER — Telehealth: Payer: Self-pay

## 2018-06-05 DIAGNOSIS — Z803 Family history of malignant neoplasm of breast: Secondary | ICD-10-CM

## 2018-06-05 NOTE — Progress Notes (Signed)
Order placed for referral to oncology for genetic testing.

## 2018-06-05 NOTE — Telephone Encounter (Signed)
Please call pt and let her know that someone should be calling her with an appt.

## 2018-06-05 NOTE — Telephone Encounter (Signed)
I do not see where referral has been placed but I did see it in your note from her last visit.

## 2018-06-05 NOTE — Telephone Encounter (Signed)
Copied from [place] [phone]. Topic: Referral - Request >> Jun 05, 2018  3:18 PM Percell Belt A wrote: Reason for CRM: pt called in and stated she was suppose to be referred to get a gene test for breast cancer?  She was unclear as to weather this was order or not?    Best number  [phone]

## 2018-06-06 ENCOUNTER — Encounter: Payer: Self-pay | Admitting: Genetic Counselor

## 2018-06-06 ENCOUNTER — Telehealth: Payer: Self-pay | Admitting: Genetic Counselor

## 2018-06-06 NOTE — Telephone Encounter (Signed)
Genetic counseling appointment has been scheduled for the pt to see Roma Kayser on 8/14 at 2pm. Letter mailed to the pt.

## 2018-06-06 NOTE — Telephone Encounter (Signed)
Patient is aware 

## 2018-06-23 ENCOUNTER — Telehealth: Payer: Self-pay | Admitting: Genetic Counselor

## 2018-06-23 NOTE — Telephone Encounter (Signed)
Pt cld to reschedule genetic counseling appt to 8/21 at 9am with Roma Kayser.

## 2018-06-26 DIAGNOSIS — H2513 Age-related nuclear cataract, bilateral: Secondary | ICD-10-CM | POA: Diagnosis not present

## 2018-07-19 ENCOUNTER — Other Ambulatory Visit: Payer: Medicare Other

## 2018-07-19 ENCOUNTER — Encounter: Payer: Medicare Other | Admitting: Genetic Counselor

## 2018-07-21 ENCOUNTER — Ambulatory Visit: Payer: Medicare Other

## 2018-07-26 ENCOUNTER — Inpatient Hospital Stay: Payer: Medicare Other

## 2018-07-26 ENCOUNTER — Inpatient Hospital Stay: Payer: Medicare Other | Attending: Genetic Counselor | Admitting: Genetic Counselor

## 2018-07-26 ENCOUNTER — Encounter: Payer: Self-pay | Admitting: Genetic Counselor

## 2018-07-26 DIAGNOSIS — Z803 Family history of malignant neoplasm of breast: Secondary | ICD-10-CM | POA: Diagnosis not present

## 2018-07-26 NOTE — Progress Notes (Signed)
REFERRING PROVIDER: Einar Pheasant, [address] [place], [place] [address]  PRIMARY PROVIDER:  Einar Pheasant, MD  PRIMARY REASON FOR VISIT:  1. Family history of breast cancer      HISTORY OF PRESENT ILLNESS:   [name], a 77 y.o. female, was seen for a [place] cancer genetics consultation at the request of Dr. Nicki Reaper due to a family history of cancer.  [name] presents to clinic today to discuss the possibility of a hereditary predisposition to cancer, genetic testing, and to further clarify her future cancer risks, as well as potential cancer risks for family members.   [name] is a 77 y.o. female with no personal history of cancer.  She is concerned about her risk for cancer not only based on the family history, but also reports having a high level of electricity in her house from the neighbor's "drone club".  She is concerned that this high dose of electricity could also increase her risk for cancer.  CANCER HISTORY:   No history exists.     HORMONAL RISK FACTORS:  Menarche was at age 89.  First live birth at age 39.  OCP use for approximately 5 years.  Ovaries intact: yes.  Hysterectomy: no.  Menopausal status: postmenopausal.  HRT use: <5 years. Colonoscopy: yes; normal. Mammogram within the last year: yes. Number of breast biopsies: 1. Up to date with pelvic exams:  yes. Any excessive radiation exposure in the past:  No excessive radiation, but reports exposure to high doses of electricity in her house from drones.  Past Medical History:  Diagnosis Date  . Anemia   . Atrial tachycardia (HCC)    s/p ablation  . Clostridium difficile infection 1997   required hospitalization  . Family history of breast cancer   . Hyperlipidemia   . Mitral valve prolapse   . Paroxysmal supraventricular tachycardia ([place])   . Rheumatic fever     Past Surgical History:  Procedure Laterality Date  . ABLATION     for atrial tachycardia  .  BREAST BIOPSY  1997  . TONSILLECTOMY  1944    Social History   Socioeconomic History  . Marital status: Widowed    Spouse name: Not on file  . Number of children: 2  . Years of education: Not on file  . Highest education level: Not on file  Occupational History  . Not on file  Social Needs  . Financial resource strain: Not on file  . Food insecurity:    Worry: Not on file    Inability: Not on file  . Transportation needs:    Medical: Not on file    Non-medical: Not on file  Tobacco Use  . Smoking status: Never Smoker  . Smokeless tobacco: Never Used  Substance and Sexual Activity  . Alcohol use: Yes    Alcohol/week: 0.0 standard drinks    Comment: occasional  . Drug use: No  . Sexual activity: Never  Lifestyle  . Physical activity:    Days per week: Not on file    Minutes per session: Not on file  . Stress: Not on file  Relationships  . Social connections:    Talks on phone: Not on file    Gets together: Not on file    Attends religious service: Not on file    Active member of club or organization: Not on file    Attends meetings of clubs or organizations: Not on file    Relationship status: Not  on file  Other Topics Concern  . Not on file  Social History Narrative  . Not on file     FAMILY HISTORY:  We obtained a detailed, 4-generation family history.  Significant diagnoses are listed below: Family History  Problem Relation Age of Onset  . Breast cancer Mother 26  . Cancer Paternal Aunt        colon cancer  . Multiple myeloma Father        dx in his 47s  . Breast cancer Sister 74       d. 15  . Heart attack Sister        due to iodine reaction  . Breast cancer Sister 53       d. 85  . Breast cancer Cousin        dx twice, first time possibly under 16; mat first cousin  . Breast cancer Maternal Aunt        dx in her 44s    The patient has two children who are cancer free.  She had two sisters, who each had breast cancer in their 69's.  Both  parents are deceased.   The patient's mother was diagnosed with breast cancer at 53.  She had four sisters and a brother.  One sister had breast cancer in her 89's-70's, and had a daughter who had breast cancer twice, the first time under age 71.  The maternal grandparents are deceased.  The patient's father had multiple myeloma in his 55's.  He had one sister who is deceased.  Both paternal grandparents are deceased.  [name] is unaware of previous family history of genetic testing for hereditary cancer risks. Patient's maternal ancestors are of [place] descent, and paternal ancestors are of [place] descent. There is no reported Ashkenazi Jewish ancestry. There is no known consanguinity.  GENETIC COUNSELING ASSESSMENT: [name] is a 77 y.o. female with a family history of breast cancer which is somewhat suggestive of a hereditary cancer syndrome and predisposition to cancer. We, therefore, discussed and recommended the following at today's visit.   DISCUSSION: We discussed that about 5-10% of breast cancer is hereditary with most cases due to BRCA mutations.  There are other genes that are associated with hereditary breast cancer syndromes, most commonly ATM, CHEK2 and PALB2.  Based on the patient's family history of most cases of breast cancer occurring in the 33's and older, the chance of this being hereditary is lower, and more likely a familial form of breast cancer, but not due to a single gene.    We reviewed the characteristics, features and inheritance patterns of hereditary cancer syndromes. We also discussed genetic testing, including the appropriate family members to test, the process of testing, insurance coverage and turn-around-time for results. We discussed the implications of a negative, positive and/or variant of uncertain significant result. We recommended Ms. Bartee pursue genetic testing for the Multi-cancer gene panel. The Multi-Gene Panel offered by [facility] includes  sequencing and/or deletion duplication testing of the following 84 genes: AIP, ALK, APC, ATM, AXIN2,BAP1,  BARD1, BLM, BMPR1A, BRCA1, BRCA2, BRIP1, CASR, CDC73, CDH1, CDK4, CDKN1B, CDKN1C, CDKN2A (p14ARF), CDKN2A (p16INK4a), CEBPA, CHEK2, CTNNA1, DICER1, DIS3L2, EGFR (c.2369C>T, p.Thr790Met variant only), EPCAM (Deletion/duplication testing only), FH, FLCN, GATA2, GPC3, GREM1 (Promoter region deletion/duplication testing only), HOXB13 (c.251G>A, p.Gly84Glu), HRAS, KIT, MAX, MEN1, MET, MITF (c.952G>A, p.Glu318Lys variant only), MLH1, MSH2, MSH3, MSH6, MUTYH, NBN, NF1, NF2, NTHL1, PALB2, PDGFRA, PHOX2B, PMS2, POLD1, POLE, POT1, PRKAR1A, PTCH1, PTEN, RAD50, RAD51C, RAD51D,  RB1, RECQL4, RET, RUNX1, SDHAF2, SDHA (sequence changes only), SDHB, SDHC, SDHD, SMAD4, SMARCA4, SMARCB1, SMARCE1, STK11, SUFU, TERC, TERT, TMEM127, TP53, TSC1, TSC2, VHL, WRN and WT1.    Based on Ms. Maiorana's family history of cancer, she meets medical criteria for genetic testing, but she does not meet Medicare criteria for genetic testing. Medicare requires that the individual being tested have a diagnosis of cancer. The patient may be tested and pay out of pocket for testing. Out of pocket testing is $250 for self pay.  She signed an ABN indicating that she wanted to have Medicare billed and decline testing before she would pay for testing.    PLAN: After considering the risks, benefits, and limitations, Ms. Grumbine  provided informed consent to pursue genetic testing and the blood sample was sent to [facility] for analysis of the Multi-cancer panel. Results should be available within approximately 2-3 weeks' time, at which point they will be disclosed by telephone to Ms. Sandner, as will any additional recommendations warranted by these results. Ms. Scarpulla will receive a summary of her genetic counseling visit and a copy of her results once available. This information will also be available in Epic. We encouraged Ms. Pickar to remain  in contact with cancer genetics annually so that we can continuously update the family history and inform her of any changes in cancer genetics and testing that may be of benefit for her family. Ms. Cadotte's questions were answered to her satisfaction today. Our contact information was provided should additional questions or concerns arise.  Lastly, we encouraged Ms. Vinas to remain in contact with cancer genetics annually so that we can continuously update the family history and inform her of any changes in cancer genetics and testing that may be of benefit for this family.   Ms.  Norwood's questions were answered to her satisfaction today. Our contact information was provided should additional questions or concerns arise. Thank you for the referral and allowing [place] to share in the care of your patient.   Tylor Gambrill P. Florene [place], [place], [facility] Certified Genetic Counselor [email or web address] phone: [phone]  The patient was seen for a total of 80 minutes in face-to-face genetic counseling.  This patient was discussed with Drs. Magrinat, Lindi Adie and/or Burr Medico who agrees with the above.    _______________________________________________________________________ For Office Staff:  Number of people involved in session: 1 Was an Intern/ student involved with case: no

## 2018-07-28 ENCOUNTER — Other Ambulatory Visit: Payer: Self-pay

## 2018-07-28 ENCOUNTER — Emergency Department
Admission: EM | Admit: 2018-07-28 | Discharge: 2018-07-29 | Disposition: A | Discharge status: Other Acute Inpatient Hospital | Payer: Medicare Other | Attending: Emergency Medicine | Admitting: Emergency Medicine

## 2018-07-28 DIAGNOSIS — Z79899 Other long term (current) drug therapy: Secondary | ICD-10-CM | POA: Insufficient documentation

## 2018-07-28 DIAGNOSIS — I10 Essential (primary) hypertension: Secondary | ICD-10-CM | POA: Diagnosis not present

## 2018-07-28 DIAGNOSIS — F22 Delusional disorders: Secondary | ICD-10-CM | POA: Insufficient documentation

## 2018-07-28 DIAGNOSIS — F259 Schizoaffective disorder, unspecified: Secondary | ICD-10-CM

## 2018-07-28 DIAGNOSIS — T7421XA Adult sexual abuse, confirmed, initial encounter: Secondary | ICD-10-CM | POA: Diagnosis not present

## 2018-07-28 DIAGNOSIS — F29 Unspecified psychosis not due to a substance or known physiological condition: Secondary | ICD-10-CM

## 2018-07-28 DIAGNOSIS — F03A0 Unspecified dementia, mild, without behavioral disturbance, psychotic disturbance, mood disturbance, and anxiety: Secondary | ICD-10-CM

## 2018-07-28 DIAGNOSIS — F0390 Unspecified dementia without behavioral disturbance: Secondary | ICD-10-CM | POA: Insufficient documentation

## 2018-07-28 LAB — CBC WITH DIFFERENTIAL/PLATELET
Basophils Absolute: 0 10*3/uL (ref 0–0.1)
Basophils Relative: 1 %
EOS ABS: 0 10*3/uL (ref 0–0.7)
Eosinophils Relative: 1 %
HCT: 40.6 % (ref 35.0–47.0)
HEMOGLOBIN: 13.8 g/dL (ref 12.0–16.0)
LYMPHS ABS: 0.8 10*3/uL — AB (ref 1.0–3.6)
Lymphocytes Relative: 13 %
MCH: 31.9 pg (ref 26.0–34.0)
MCHC: 34 g/dL (ref 32.0–36.0)
MCV: 94 fL (ref 80.0–100.0)
MONOS PCT: 7 %
Monocytes Absolute: 0.4 10*3/uL (ref 0.2–0.9)
NEUTROS PCT: 78 %
Neutro Abs: 5 10*3/uL (ref 1.4–6.5)
Platelets: 370 10*3/uL (ref 150–440)
RBC: 4.32 MIL/uL (ref 3.80–5.20)
RDW: 13 % (ref 11.5–14.5)
WBC: 6.3 10*3/uL (ref 3.6–11.0)

## 2018-07-28 LAB — COMPREHENSIVE METABOLIC PANEL
ALBUMIN: 4.5 g/dL (ref 3.5–5.0)
ALK PHOS: 68 U/L (ref 38–126)
ALT: 14 U/L (ref 0–44)
ANION GAP: 9 (ref 5–15)
AST: 29 U/L (ref 15–41)
BUN: 18 mg/dL (ref 8–23)
CALCIUM: 9.8 mg/dL (ref 8.9–10.3)
CO2: 27 mmol/L (ref 22–32)
Chloride: 104 mmol/L (ref 98–111)
Creatinine, Ser: 0.97 mg/dL (ref 0.44–1.00)
GFR calc Af Amer: >60 mL/min (ref >60)
GFR calc non Af Amer: 55 mL/min — ABNORMAL LOW (ref >60)
GLUCOSE: 114 mg/dL — AB (ref 70–99)
Potassium: 3.6 mmol/L (ref 3.5–5.1)
SODIUM: 140 mmol/L (ref 135–145)
Total Bilirubin: 0.7 mg/dL (ref 0.3–1.2)
Total Protein: 7.3 g/dL (ref 6.5–8.1)

## 2018-07-28 LAB — ETHANOL: Alcohol, Ethyl (B): <10 mg/dL (ref <10)

## 2018-07-28 MED ORDER — RISPERIDONE 0.5 MG PO TBDP
1.0000 mg | ORAL_TABLET | Freq: Every day | ORAL | Status: DC
  Administered 2018-07-28: 1 mg via ORAL
  Filled 2018-07-28: qty 2

## 2018-07-28 NOTE — Consult Note (Signed)
[facility]ry Consult   Reason for Consult: Consult for this 77 year old woman with complex paranoid psychosis brought in by law enforcement Referring Physician: Corky Downs Patient Identification: Jody Howard MRN:  [id number] Principal Diagnosis: Psychosis [facility]) Diagnosis:   Patient Active Problem List   Diagnosis Date Noted  . Psychosis ([place]) [F29] 07/28/2018  . Mild dementia [F03.90] 07/28/2018  . Delusional disorder ([place]) [F22] 07/28/2018  . Family history of breast cancer [Z80.3] 10/24/2016  . Right hip pain [M25.551] 07/23/2015  . Stress [F43.9] 01/21/2015  . Health care maintenance [Z00.00] 01/21/2015  . Female bladder prolapse [N81.10] 09/24/2014  . Vaginal bleeding [N93.9] 09/24/2014  . Abdominal pain, left lower quadrant [R10.32] 09/24/2014  . Left arm pain [M79.602] 04/07/2014  . Dysuria [R30.0] 06/14/2013  . Vaginal prolapse [N81.10] 06/06/2013  . Pure hypercholesterolemia [E78.00] 02/11/2013  . Bronchiectasis ([place]) [J47.9] 02/11/2013  . Atrial tachycardia ([place]) [I47.1] 01/30/2010    Total Time spent with patient: 1 hour  Subjective:   Jody Howard is a 77 y.o. female patient admitted with "I cannot believe that they did this to me".  HPI: Patient interviewed chart reviewed.  Also spoke with other providers familiar with the patient's history and spoke with some of the patient's family.  77 year old woman brought in by police from [facility].  Apparently she had gone to [facility] today ostensibly to pay some property taxes although reportedly she was going to the wrong office and her behavior was odd from the very get go.  Along the way she became paranoid about other bystanders believing people were spying on her or following her and made a bit of a scene about it including going to the police or other government offices.  Evidently it was disruptive enough that other members of the public were concerned about it.  The patient herself has no insight  into how her behavior was problematic.  She goes on at great length describing a multitude of paranoid delusional ideas.  Some of these involved believes that people are constantly spying on her and following on her her tail and that they are sending messages about her and that drones are spying on her.  Others are some long-standing paranoid delusions regarding her family.  Patient denies suicidal or homicidal ideation.  Denies any health symptoms.  Denies alcohol or drug abuse.  Denies being on any current medications of any significance.  Family reports that her long-standing psychotic delusions have become more and more disruptive of her relationships in her family life.  Social history: Patient is financially stable or well off.  Lives independently.  She is the mother of more than one physician 1 of whom he has a prominent ear nose and throat surgeon here at the hospital.  She is a widow and in the past was a prominent member of local society but her psychosis and behavior have increasingly alienated her.  Medical history: Patient has had some problems with-year-old gynecological issues but those have all been taken care of.  Really has no significant ongoing medical problems requiring ongoing treatment as near as I can tell.  Substance abuse history: None whatsoever  Past Psychiatric History: From what I have been able to learn this patient's psychotic symptoms have been present for many years.  They go back even to before her late husband's sickness and death.  Patient has never had any insight into it and never been compliant with family's attempts to get her to engage in treatment.  She was  hospitalized briefly at [facility] several years ago and although it sounds like they recommended antipsychotic treatment according to her son the hospital did not make any arrangements for follow-up and the patient never followed up with appropriate outpatient care.  She has resisted all efforts to get her to engage  in psychiatric care recently.  Her behavior has become increasingly bizarre and alienating towards her family.  No history however of suicide attempts or violence.  Risk to Self:   Risk to Others:   Prior Inpatient Therapy:   Prior Outpatient Therapy:    Past Medical History:  Past Medical History:  Diagnosis Date  . Anemia   . Atrial tachycardia (HCC)    s/p ablation  . Clostridium difficile infection 1997   required hospitalization  . Family history of breast cancer   . Hyperlipidemia   . Mitral valve prolapse   . Paroxysmal supraventricular tachycardia ([place])   . Rheumatic fever     Past Surgical History:  Procedure Laterality Date  . ABLATION     for atrial tachycardia  . BREAST BIOPSY  1997  . TONSILLECTOMY  1944   Family History:  Family History  Problem Relation Age of Onset  . Breast cancer Mother 25  . Cancer Paternal Aunt        colon cancer  . Multiple myeloma Father        dx in his 41s  . Breast cancer Sister 21       d. 20  . Heart attack Sister        due to iodine reaction  . Breast cancer Sister 46       d. 58  . Breast cancer Cousin        dx twice, first time possibly under 24; mat first cousin  . Breast cancer Maternal Aunt        dx in her 63s   Family Psychiatric  History: None identified Social History:  Social History   Substance and Sexual Activity  Alcohol Use Yes  . Alcohol/week: 0.0 standard drinks   Comment: occasional     Social History   Substance and Sexual Activity  Drug Use No    Social History   Socioeconomic History  . Marital status: Widowed    Spouse name: Not on file  . Number of children: 2  . Years of education: Not on file  . Highest education level: Not on file  Occupational History  . Not on file  Social Needs  . Financial resource strain: Not on file  . Food insecurity:    Worry: Not on file    Inability: Not on file  . Transportation needs:    Medical: Not on file    Non-medical: Not on file   Tobacco Use  . Smoking status: Never Smoker  . Smokeless tobacco: Never Used  Substance and Sexual Activity  . Alcohol use: Yes    Alcohol/week: 0.0 standard drinks    Comment: occasional  . Drug use: No  . Sexual activity: Never  Lifestyle  . Physical activity:    Days per week: Not on file    Minutes per session: Not on file  . Stress: Not on file  Relationships  . Social connections:    Talks on phone: Not on file    Gets together: Not on file    Attends religious service: Not on file    Active member of club or organization: Not on file    Attends meetings  of clubs or organizations: Not on file    Relationship status: Not on file  Other Topics Concern  . Not on file  Social History Narrative  . Not on file   Additional Social History:    Allergies:   Allergies  Allergen Reactions  . Codeine Other (See Comments)    Unknown.  . Lipitor [Atorvastatin] Other (See Comments)    unknown  . Simvastatin Other (See Comments)    unknown  . Other Nausea And Vomiting    Has had c-diff in past and cipro causes severe vomiting  . Silicone Nausea And Vomiting  . Flagyl [Metronidazole] Other (See Comments)    unknown    Labs:  Results for orders placed or performed during the hospital encounter of 07/28/18 (from the past 48 hour(s))  Comprehensive metabolic panel     Status: Abnormal   Collection Time: 07/28/18  3:48 PM  Result Value Ref Range   Sodium 140 135 - 145 mmol/L   Potassium 3.6 3.5 - 5.1 mmol/L   Chloride 104 98 - 111 mmol/L   CO2 27 22 - 32 mmol/L   Glucose, Bld 114 (H) 70 - 99 mg/dL   BUN 18 8 - 23 mg/dL   Creatinine, Ser 0.97 0.44 - 1.00 mg/dL   Calcium 9.8 8.9 - 10.3 mg/dL   Total Protein 7.3 6.5 - 8.1 g/dL   Albumin 4.5 3.5 - 5.0 g/dL   AST 29 15 - 41 U/L   ALT 14 0 - 44 U/L   Alkaline Phosphatase 68 38 - 126 U/L   Total Bilirubin 0.7 0.3 - 1.2 mg/dL   GFR calc non Af Amer 55 (L) >60 mL/min   GFR calc Af Amer >60 >60 mL/min    Comment:  (NOTE) The eGFR has been calculated using the CKD EPI equation. This calculation has not been validated in all clinical situations. eGFR's persistently <60 mL/min signify possible Chronic Kidney Disease.    Anion gap 9 5 - 15    Comment: Performed at [facility], [address] [place], [place] [address]  Ethanol     Status: None   Collection Time: 07/28/18  3:48 PM  Result Value Ref Range   Alcohol, Ethyl (B) <10 <10 mg/dL    Comment: (NOTE) Lowest detectable limit for serum alcohol is 10 mg/dL. For medical purposes only. Performed at [facility], [address] [place], [place] [address]   CBC with Diff     Status: Abnormal   Collection Time: 07/28/18  3:48 PM  Result Value Ref Range   WBC 6.3 3.6 - 11.0 K/uL   RBC 4.32 3.80 - 5.20 MIL/uL   Hemoglobin 13.8 12.0 - 16.0 g/dL   HCT 40.6 35.0 - 47.0 %   MCV 94.0 80.0 - 100.0 fL   MCH 31.9 26.0 - 34.0 pg   MCHC 34.0 32.0 - 36.0 g/dL   RDW 13.0 11.5 - 14.5 %   Platelets 370 150 - 440 K/uL   Neutrophils Relative % 78 %   Neutro Abs 5.0 1.4 - 6.5 K/uL   Lymphocytes Relative 13 %   Lymphs Abs 0.8 (L) 1.0 - 3.6 K/uL   Monocytes Relative 7 %   Monocytes Absolute 0.4 0.2 - 0.9 K/uL   Eosinophils Relative 1 %   Eosinophils Absolute 0.0 0 - 0.7 K/uL   Basophils Relative 1 %   Basophils Absolute 0.0 0 - 0.1 K/uL    Comment: Performed at [facility], [address] [place], [place] [address]    No current facility-administered medications for this encounter.    Current Outpatient Medications  Medication Sig Dispense Refill  . CALCIUM PO Take 1 tablet by mouth daily.     . Cholecalciferol (VITAMIN D-3 PO) Take 1 capsule by mouth daily. Taking 2000 IU daily    . CRESTOR 5 MG tablet TAKE 1/2 TABLET(2.5 MG) BY MOUTH DAILY 30 tablet 5  . meloxicam (MOBIC) 15 MG tablet Take 1 tablet by mouth daily.    . Multiple Vitamins-Minerals (MULTIVITAMIN PO) Take 1 tablet by mouth daily.        Musculoskeletal: Strength & Muscle Tone: within normal limits Gait & Station: normal Patient leans: N/A  Psychiatric Specialty Exam: Physical Exam  Nursing note and vitals reviewed. Constitutional: She appears well-developed and well-nourished.  HENT:  Head: Normocephalic and atraumatic.  Eyes: Pupils are equal, round, and reactive to light. Conjunctivae are normal.  Neck: Normal range of motion.  Cardiovascular: Regular rhythm and normal heart sounds.  Respiratory: Effort normal. No respiratory distress.  GI: Soft.  Musculoskeletal: Normal range of motion.  Neurological: She is alert.  Skin: Skin is warm and dry.  Psychiatric: Her mood appears anxious. Her speech is rapid and/or pressured and tangential. She is agitated. She is not aggressive. Thought content is paranoid and delusional. Cognition and memory are impaired. She expresses inappropriate judgment. She expresses no homicidal and no suicidal ideation. She exhibits abnormal recent memory.    Review of Systems  Constitutional: Negative.   HENT: Negative.   Eyes: Negative.   Respiratory: Negative.   Cardiovascular: Negative.   Gastrointestinal: Negative.   Musculoskeletal: Negative.   Skin: Negative.   Neurological: Negative.   Psychiatric/Behavioral: Negative for depression, hallucinations, memory loss, substance abuse and suicidal ideas. The patient is nervous/anxious. The patient does not have insomnia.     Height _0  (1.651 m), weight 4.536 kg, last menstrual period 01/29/1983, SpO2 100 %.Body mass index is 1.66 kg/m.  General Appearance: Fairly Groomed  Eye Contact:  Fair  Speech:  Pressured  Volume:  Increased  Mood:  Anxious and Dysphoric  Affect:  Congruent  Thought Process:  Disorganized  Orientation:  Other:  Patient is basically oriented to the situation although she has some disorientation about some of the details and particularly seems to been disoriented in her behavior earlier today.  Thought  Content:  Illogical, Delusions, Ideas of Reference:   Paranoia and Paranoid Ideation  Suicidal Thoughts:  No  Homicidal Thoughts:  No  Memory:  Immediate;   Fair Recent;   Poor Remote;   Hard to assess.  She maintains some remote memory but a lot of the things she says are repetitive and I suspect that she is starting to get more general memory problems  Judgement:  Impaired  Insight:  Lacking  Psychomotor Activity:  Restlessness  Concentration:  Concentration: Poor  Recall:  Poor  Fund of Knowledge:  Fair  Language:  Good  Akathisia:  No  Handed:  Right  AIMS (if indicated):     Assets:  Catering manager Housing Physical Health Resilience Social Support  ADL's:  Impaired  Cognition:  Impaired,  Mild  Sleep:        Treatment Plan Summary: Daily contact with patient to assess and evaluate symptoms and progress in treatment, Medication management and Plan 77 year old woman who has been suffering with psychotic symptoms for many years.  Patient's condition does not fit neatly into a definite single diagnostic  category.  Most similar probably to either a mood disorder with psychotic symptoms or a delusional disorder although if it is a delusional disorder it is one that has become so all encompassing and has disrupted most elements of her life.  In addition I suspect that she is having some worsening dementia although mild in degree which is making this more difficult and making her functioning worse.  After discussion with the family it sounds like her behavior has become problematic and dangerous enough that it is appropriate to continue the involuntary commitment and try to get her into the hospital with the hope that medication and other treatment can improve her mental state and improve her long-term functioning.  Case reviewed with TTS and emergency room doctor.  Patient can be admitted to our hospital if a bed were to become available although I recommend that in the  meantime she can be referred out to other facilities as well as we may be short of beds at our hospital.  I will start a low dose of risperidone tonight that may help with sleep and perhaps begin the process of treating the psychosis.  Case reviewed with ER physician.  Disposition: Recommend psychiatric Inpatient admission when medically cleared. Supportive therapy provided about ongoing stressors.  Alethia Berthold, MD 07/28/2018 5:23 PM

## 2018-07-28 NOTE — ED Notes (Signed)
Pt. Is currently resting on bed.  Pt. Is calm and cooperative at this time.
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
## 2018-07-28 NOTE — ED Notes (Signed)
Patient is resting comfortably. 

## 2018-07-28 NOTE — ED Notes (Signed)
Pt Belongings: Pink Shirt, Blue Short Pants, Tan underwear, black shoes, watch, necklace, and 1 ring, and purse with $8.00 cash, bank cards, lots of papers and keys... EVERYTHING PLACED IN PURSE BY PT.Marland KitchenMarland KitchenNo cell phone was found on pt even though she stated she had one.  RN Berton Lan watch this Theme park manager for other belongings and PT counted out money with this Probation officer as well.
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
## 2018-07-29 ENCOUNTER — Inpatient Hospital Stay
Admission: AD | Admit: 2018-07-29 | Discharge: 2018-08-10 | DRG: 885 | Disposition: A | Discharge status: Home / Self Care | Payer: Medicare Other | Attending: Psychiatry | Admitting: Psychiatry

## 2018-07-29 ENCOUNTER — Other Ambulatory Visit: Payer: Self-pay

## 2018-07-29 ENCOUNTER — Emergency Department: Payer: Medicare Other

## 2018-07-29 DIAGNOSIS — E785 Hyperlipidemia, unspecified: Secondary | ICD-10-CM | POA: Diagnosis present

## 2018-07-29 DIAGNOSIS — Z79899 Other long term (current) drug therapy: Secondary | ICD-10-CM | POA: Diagnosis not present

## 2018-07-29 DIAGNOSIS — Z803 Family history of malignant neoplasm of breast: Secondary | ICD-10-CM | POA: Diagnosis not present

## 2018-07-29 DIAGNOSIS — F259 Schizoaffective disorder, unspecified: Secondary | ICD-10-CM | POA: Diagnosis present

## 2018-07-29 DIAGNOSIS — F419 Anxiety disorder, unspecified: Secondary | ICD-10-CM | POA: Diagnosis present

## 2018-07-29 DIAGNOSIS — F0390 Unspecified dementia without behavioral disturbance: Secondary | ICD-10-CM | POA: Diagnosis present

## 2018-07-29 DIAGNOSIS — F251 Schizoaffective disorder, depressive type: Secondary | ICD-10-CM | POA: Diagnosis not present

## 2018-07-29 DIAGNOSIS — I341 Nonrheumatic mitral (valve) prolapse: Secondary | ICD-10-CM | POA: Diagnosis present

## 2018-07-29 DIAGNOSIS — F22 Delusional disorders: Secondary | ICD-10-CM | POA: Diagnosis not present

## 2018-07-29 DIAGNOSIS — F250 Schizoaffective disorder, bipolar type: Secondary | ICD-10-CM | POA: Diagnosis not present

## 2018-07-29 DIAGNOSIS — R000 Tachycardia, unspecified: Secondary | ICD-10-CM | POA: Diagnosis not present

## 2018-07-29 DIAGNOSIS — I471 Supraventricular tachycardia: Secondary | ICD-10-CM | POA: Diagnosis present

## 2018-07-29 DIAGNOSIS — F29 Unspecified psychosis not due to a substance or known physiological condition: Secondary | ICD-10-CM | POA: Diagnosis present

## 2018-07-29 LAB — TSH: TSH: 2.562 u[IU]/mL (ref 0.350–4.500)

## 2018-07-29 LAB — FOLATE: Folate: 21.4 ng/mL (ref >5.9)

## 2018-07-29 MED ORDER — CITALOPRAM HYDROBROMIDE 20 MG PO TABS
20.0000 mg | ORAL_TABLET | Freq: Every day | ORAL | Status: DC
  Administered 2018-07-29: 20 mg via ORAL
  Filled 2018-07-29: qty 1

## 2018-07-29 MED ORDER — MAGNESIUM HYDROXIDE 400 MG/5ML PO SUSP: 30.0000 mL | Freq: Every day | ORAL | Status: DC | PRN

## 2018-07-29 MED ORDER — GADOBENATE DIMEGLUMINE 529 MG/ML IV SOLN
10.0000 mL | Freq: Once | INTRAVENOUS | Status: AC | PRN
  Administered 2018-07-29: 10 mL via INTRAVENOUS

## 2018-07-29 MED ORDER — RISPERIDONE 0.5 MG PO TBDP
0.5000 mg | ORAL_TABLET | Freq: Once | ORAL | Status: AC
  Administered 2018-07-29: 0.5 mg via ORAL
  Filled 2018-07-29: qty 1

## 2018-07-29 MED ORDER — ALUM & MAG HYDROXIDE-SIMETH 200-200-20 MG/5ML PO SUSP: 30.0000 mL | ORAL | Status: DC | PRN

## 2018-07-29 MED ORDER — RISPERIDONE 1 MG PO TABS
1.0000 mg | ORAL_TABLET | Freq: Every day | ORAL | Status: DC
  Administered 2018-07-29: 1 mg via ORAL
  Filled 2018-07-29: qty 1

## 2018-07-29 MED ORDER — TRAZODONE HCL 50 MG PO TABS
50.0000 mg | ORAL_TABLET | Freq: Every evening | ORAL | Status: DC | PRN
  Administered 2018-08-05: 50 mg via ORAL
  Filled 2018-07-29 (×2): qty 1

## 2018-07-29 MED ORDER — RISPERIDONE 0.5 MG PO TBDP: 1.5000 mg | ORAL_TABLET | Freq: Every day | ORAL | Status: DC

## 2018-07-29 MED ORDER — ACETAMINOPHEN 325 MG PO TABS: 650.0000 mg | ORAL_TABLET | Freq: Four times a day (QID) | ORAL | Status: DC | PRN

## 2018-07-29 MED ORDER — HYDROXYZINE HCL 10 MG PO TABS
10.0000 mg | ORAL_TABLET | Freq: Three times a day (TID) | ORAL | Status: DC | PRN
  Filled 2018-07-29: qty 1

## 2018-07-29 NOTE — Tx Team (Signed)
Initial Treatment Plan 07/29/2018 6:37 PM Ayah Vladimir Creeks Scheu [id number]    PATIENT STRESSORS: Loss of spouse Other: declining cognitively    PATIENT STRENGTHS: Average or above average intelligence Communication skills Financial means Physical Health Supportive family/friends   PATIENT IDENTIFIED PROBLEMS:  Denies  any problems                      DISCHARGE CRITERIA:  Ability to meet basic life and health needs Adequate post-discharge living arrangements Improved stabilization in mood, thinking, and/or behavior  PRELIMINARY DISCHARGE PLAN: Return to previous living arrangement  PATIENT/FAMILY INVOLVEMENT: This treatment plan has been presented to and reviewed with the patient, Antania Hoefling Cressey.  The patient has been given the opportunity to ask questions and make suggestions.  Roxy Manns, RN 07/29/2018, 6:37 PM

## 2018-07-29 NOTE — ED Notes (Signed)
Pt. IVC and pending placement.

## 2018-07-29 NOTE — ED Provider Notes (Signed)
-----------------------------------------   7:25 AM on 07/29/2018 -----------------------------------------   Height 5\' 5"  (1.651 m), weight 49.9 kg, last menstrual period 01/29/1983, SpO2 100 %.  The patient had no acute events since last update.  Calm and cooperative at this time.  Disposition is pending Psychiatry/Behavioral Medicine team recommendations.     Schuyler Amor, MD 07/29/18 [phone]

## 2018-07-29 NOTE — ED Notes (Signed)
Pt. Woke up due to yelling in next room.  Pt. Requested and was given remote for tv.

## 2018-07-29 NOTE — ED Notes (Signed)
Pt taken to MRI via wheelchair.

## 2018-07-29 NOTE — ED Notes (Signed)
Pt off unit @MRI

## 2018-07-29 NOTE — Consult Note (Addendum)
[facility] Consult   Reason for Consult: Consult for this 77 year old woman with complex paranoid psychosis brought in by law enforcement Referring Physician: Corky Downs Patient Identification: Jody Howard MRN:  [id number] Principal Diagnosis: Psychosis [facility]) Diagnosis:   Patient Active Problem List   Diagnosis Date Noted  . Psychosis ([place]) [F29] 07/28/2018  . Mild dementia [F03.90] 07/28/2018  . Delusional disorder ([place]) [F22] 07/28/2018  . Family history of breast cancer [Z80.3] 10/24/2016  . Right hip pain [M25.551] 07/23/2015  . Stress [F43.9] 01/21/2015  . Health care maintenance [Z00.00] 01/21/2015  . Female bladder prolapse [N81.10] 09/24/2014  . Vaginal bleeding [N93.9] 09/24/2014  . Abdominal pain, left lower quadrant [R10.32] 09/24/2014  . Left arm pain [M79.602] 04/07/2014  . Dysuria [R30.0] 06/14/2013  . Vaginal prolapse [N81.10] 06/06/2013  . Pure hypercholesterolemia [E78.00] 02/11/2013  . Bronchiectasis ([place]) [J47.9] 02/11/2013  . Atrial tachycardia ([place]) [I47.1] 01/30/2010    Total Time spent with patient: 1 hour  Subjective:   Jody Howard is a 77 y.o. female patient who was brought to the emergency room by the sheriff in [place] under involuntary commitment after endorsing paranoid and delusional thoughts.  The patient was evaluated by Dr. Weber Cooks in the emergency room yesterday and it was determined that she needs admission.  The patient has had fixed, paranoid and delusional thoughts about various family members including her deceased husband.  She believes that another woman in town has hired people to follow her and that they were at the tax office yesterday when she was committed.  She denies any current active or passive suicidal thoughts but there is a history of some depression that occurred after her husband died a few years back.  The patient is fairly isolative in her home and has had some strange relationship with family members  because of her paranoid and delusional thoughts.  She denies any auditory or visual hallucinations.  The patient was hospitalized at [facility] several years back and was placed on Risperdal but did not take any medication after discharge..  She did take Resporal that was given to her in the emergency room last night.  Judgment and insight are extremely poor.  Past psychiatric history: The patient was hospitalized at [facility] in the past and discharged on Risperdal for psychosis.  No history of any prior suicide attempts.  The patient has very poor insight and does not believe that she has any mental illness.  Therefore, she has not been compliant with any outpatient treatment in the past.  She has not seen a psychiatrist as an outpatient in the past.  Social history: The patient is currently widowed and has 2 children, one son and one daughter both of whom are physicians.  She currently lives alone in the [place] area but her son lives in [place].  Her daughter lives in [place].   Substance Abuse History: No history of any prior heavy alcohol use or illicit drug use.  Legal History:No history of arrests or incarcerations   Risk to Self:  No Risk to Others:  Possibly Prior Inpatient Therapy:  Yes,  [facility] Prior Outpatient Therapy:  No  Past Medical History:  Past Medical History:  Diagnosis Date  . Anemia   . Atrial tachycardia (HCC)    s/p ablation  . Clostridium difficile infection 1997   required hospitalization  . Family history of breast cancer   . Hyperlipidemia   . Mitral valve prolapse   . Paroxysmal supraventricular tachycardia ([place])   .  Rheumatic fever     Past Surgical History:  Procedure Laterality Date  . ABLATION     for atrial tachycardia  . BREAST BIOPSY  1997  . TONSILLECTOMY  1944   Family History:  Family History  Problem Relation Age of Onset  . Breast cancer Mother 67  . Cancer Paternal Aunt        colon cancer  . Multiple myeloma Father        dx in his  2s  . Breast cancer Sister 1       d. 54  . Heart attack Sister        due to iodine reaction  . Breast cancer Sister 57       d. 1  . Breast cancer Cousin        dx twice, first time possibly under 61; mat first cousin  . Breast cancer Maternal Aunt        dx in her 45s     Social History:  Social History   Substance and Sexual Activity  Alcohol Use Yes  . Alcohol/week: 0.0 standard drinks   Comment: occasional     Social History   Substance and Sexual Activity  Drug Use No    Social History   Socioeconomic History  . Marital status: Widowed    Spouse name: Not on file  . Number of children: 2  . Years of education: Not on file  . Highest education level: Not on file  Occupational History  . Not on file  Social Needs  . Financial resource strain: Not on file  . Food insecurity:    Worry: Not on file    Inability: Not on file  . Transportation needs:    Medical: Not on file    Non-medical: Not on file  Tobacco Use  . Smoking status: Never Smoker  . Smokeless tobacco: Never Used  Substance and Sexual Activity  . Alcohol use: Yes    Alcohol/week: 0.0 standard drinks    Comment: occasional  . Drug use: No  . Sexual activity: Never  Lifestyle  . Physical activity:    Days per week: Not on file    Minutes per session: Not on file  . Stress: Not on file  Relationships  . Social connections:    Talks on phone: Not on file    Gets together: Not on file    Attends religious service: Not on file    Active member of club or organization: Not on file    Attends meetings of clubs or organizations: Not on file    Relationship status: Not on file  Other Topics Concern  . Not on file  Social History Narrative  . Not on file       Allergies:   Allergies  Allergen Reactions  . Codeine Other (See Comments)    Unknown.  . Lipitor [Atorvastatin] Other (See Comments)    unknown  . Simvastatin Other (See Comments)    unknown  . Other Nausea And Vomiting     Has had c-diff in past and cipro causes severe vomiting  . Silicone Nausea And Vomiting  . Flagyl [Metronidazole] Other (See Comments)    unknown    Labs:  Results for orders placed or performed during the hospital encounter of 07/28/18 (from the past 48 hour(s))  Comprehensive metabolic panel     Status: Abnormal   Collection Time: 07/28/18  3:48 PM  Result Value Ref Range   Sodium 140  135 - 145 mmol/L   Potassium 3.6 3.5 - 5.1 mmol/L   Chloride 104 98 - 111 mmol/L   CO2 27 22 - 32 mmol/L   Glucose, Bld 114 (H) 70 - 99 mg/dL   BUN 18 8 - 23 mg/dL   Creatinine, Ser 0.97 0.44 - 1.00 mg/dL   Calcium 9.8 8.9 - 10.3 mg/dL   Total Protein 7.3 6.5 - 8.1 g/dL   Albumin 4.5 3.5 - 5.0 g/dL   AST 29 15 - 41 U/L   ALT 14 0 - 44 U/L   Alkaline Phosphatase 68 38 - 126 U/L   Total Bilirubin 0.7 0.3 - 1.2 mg/dL   GFR calc non Af Amer 55 (L) >60 mL/min   GFR calc Af Amer >60 >60 mL/min    Comment: (NOTE) The eGFR has been calculated using the CKD EPI equation. This calculation has not been validated in all clinical situations. eGFR's persistently <60 mL/min signify possible Chronic Kidney Disease.    Anion gap 9 5 - 15    Comment: Performed at [facility], [address] [place], [place] [address]  Ethanol     Status: None   Collection Time: 07/28/18  3:48 PM  Result Value Ref Range   Alcohol, Ethyl (B) <10 <10 mg/dL    Comment: (NOTE) Lowest detectable limit for serum alcohol is 10 mg/dL. For medical purposes only. Performed at [facility], [address] [place], [place] [address]   CBC with Diff     Status: Abnormal   Collection Time: 07/28/18  3:48 PM  Result Value Ref Range   WBC 6.3 3.6 - 11.0 K/uL   RBC 4.32 3.80 - 5.20 MIL/uL   Hemoglobin 13.8 12.0 - 16.0 g/dL   HCT 40.6 35.0 - 47.0 %   MCV 94.0 80.0 - 100.0 fL   MCH 31.9 26.0 - 34.0 pg   MCHC 34.0 32.0 - 36.0 g/dL   RDW 13.0 11.5 - 14.5 %   Platelets 370 150 - 440 K/uL   Neutrophils  Relative % 78 %   Neutro Abs 5.0 1.4 - 6.5 K/uL   Lymphocytes Relative 13 %   Lymphs Abs 0.8 (L) 1.0 - 3.6 K/uL   Monocytes Relative 7 %   Monocytes Absolute 0.4 0.2 - 0.9 K/uL   Eosinophils Relative 1 %   Eosinophils Absolute 0.0 0 - 0.7 K/uL   Basophils Relative 1 %   Basophils Absolute 0.0 0 - 0.1 K/uL    Comment: Performed at [facility], [address] [place], [place] [address]    Current Facility-Administered Medications  Medication Dose Route Frequency Provider Last Rate Last Dose  . citalopram (CELEXA) tablet 20 mg  20 mg Oral Daily Chauncey Mann, MD      . risperiDONE (RISPERDAL M-TABS) disintegrating tablet 1.5 mg  1.5 mg Oral QHS Chauncey Mann, MD       Current Outpatient Medications  Medication Sig Dispense Refill  . CALCIUM PO Take 1 tablet by mouth daily.     . Cholecalciferol (VITAMIN D-3 PO) Take 1 capsule by mouth daily. Taking 2000 IU daily    . CRESTOR 5 MG tablet TAKE 1/2 TABLET(2.5 MG) BY MOUTH DAILY 30 tablet 5  . meloxicam (MOBIC) 15 MG tablet Take 1 tablet by mouth daily.    . Multiple Vitamins-Minerals (MULTIVITAMIN PO) Take 1 tablet by mouth daily.       Musculoskeletal: Strength & Muscle Tone: within normal limits Gait &  Station: normal Patient leans: N/A  Psychiatric Specialty Exam: Physical Exam  Nursing note and vitals reviewed. Psychiatric: Her behavior is normal. Her mood appears anxious. Her speech is rapid and/or pressured. Thought content is paranoid and delusional. Cognition and memory are impaired. She expresses inappropriate judgment.    Review of Systems  Constitutional: Negative.   HENT: Negative.   Eyes: Negative.   Respiratory: Negative.   Cardiovascular: Negative.   Gastrointestinal: Negative.   Musculoskeletal: Negative.   Skin: Negative.   Neurological: Negative.   Endo/Heme/Allergies: Negative.   Psychiatric/Behavioral: Negative for depression, hallucinations, memory loss, substance abuse and suicidal ideas.  The patient is nervous/anxious. The patient does not have insomnia.     Height 5' 5"  (1.651 m), weight 49.9 kg, last menstrual period 01/29/1983, SpO2 100 %.Body mass index is 18.3 kg/m.  General Appearance: Fairly Groomed  Eye Contact:  Fair  Speech:  Pressured at times  Volume:  Normal  Mood:  Anxious  Affect:  Congruent  Thought Process:  Disorganized  Orientation:  Full (Time, Place, and Person) but at times has confusion about prior dates/times  Thought Content:  Illogical, Delusions, Ideas of Reference:   Paranoia and Paranoid Ideation  Suicidal Thoughts:  No  Homicidal Thoughts:  No  Memory:  Immediate;   Fair Recent;   Fair Remote;   Poor  Judgement:  Impaired  Insight:  Lacking -- Poor  Psychomotor Activity:  Restlessness  Concentration:  Concentration: Poor  Recall:  Poor  Fund of Knowledge:  Fair  Language:  Good  Akathisia:  No  Handed:  Right  AIMS (if indicated):     Assets:  Financial Resources/Insurance Housing Physical Health Resilience Social Support  ADL's:  Intact [place]  Cognition:  Impaired,  Mild  Sleep:        Treatment Plan Summary:  Differential Diagnosis: Psychosis Secondary to: Major Depression with Psychosis with possible Dementia related Psychosis Delusional Disorder R/O Vascular Dementia  Ms. Hausmann is a 76 y/o widowed Caucasian female with no significant past psychiatric history y until her late 57s who was brought to the emergency room under involuntary commitment for  who was brought to the emergency room under involuntary commitment for paranoid and delusional thoughts.She has had some depression since her husband died approximately 2 or 3ndorse some paranoid and delusional thoughts. She has had some depression since her husband died approximately 3-4 years ago.  She will be admitted to inpatient psychiatry for medication management, safety and stabilization. She will be admitted to inpatient psychiatry medication management, safety  and stabilization.   Psychosis: The patient may have multiple factors contributing to psychosis.  - Will increase Risperdal to a total of 1.5 mg p.o. nightly forwill increase Risperdal to a total of 1.5 mg by mouth nighly for psychosis - Will check HgA1c and Lipid Panel since she will be on Risperdal - Will get MRI of the head to rule out underlying vascular dementia - Will check P71 and folic acid as well as TSH  - Consider LAI medication due to noncompliance  Spoke with the patient's son who is also healthcare power of attorney.  Collateral information received.  Advised the patient's son about guardianship given the fact that she has been decompensating over the past several years with long history of noncompliance.  Disposition: The patient does have a stable living situation but may need supervision after discharge.  The patient's family is willing to pursue any services that she may need after discharge.  She will will  need psychotropic medication management.  TTS is aware of need for admission and hopefully she can be transferred to Inpatient Psychiatry today     Threasa Beards, MD 07/29/2018 12:30 PM

## 2018-07-29 NOTE — ED Notes (Signed)
Pt to be transferred to BMU. Report given to Mechele Claude, [place].  Pt accepting to disposition. Maintained on 15 minute checks and observation by security camera for safety.

## 2018-07-29 NOTE — [facility] Counselor (Signed)
Dr. Nicolasa Ducking contacted TTS to inquire about admission for patient; TTS explained BMU was currently at capacity;  Explained TTS Consult was not placed and patient was not currently on TTS patient list, however spoke with Claiborne Billings regarding patient to inquire about possible placement.Claiborne Billings stated she would speak with BMU Charge Nurse to see if movement was possible for 07/29/18...  Valora Norell

## 2018-07-29 NOTE — Progress Notes (Signed)
Patient ID: Jody Howard, female   DOB: 16-Aug-1941, 77 y.o.   MRN: [id number] Per State regulations 482.30 this chart was reviewed for medical necessity with respect to the patient's admission/duration of stay.    Next review date: 08/02/18  Debarah Crape, BSN, RN-BC  Case Manager

## 2018-07-29 NOTE — ED Notes (Signed)
Pt gave this Probation officer verbal permission to give her son they keys to her home and car. 3 sets of keys given.

## 2018-07-29 NOTE — ED Notes (Signed)
Pt transferred to BMU under IVC. VS stable. Report given to Mechele Claude, [place]. Pt accepting of disposition. All belongings sent with patient.

## 2018-07-29 NOTE — Progress Notes (Signed)
Received Salam from the ED, alert and oriented x3. She is IVC and not pleased to be her on the unit. She is cooperative with the admission process and was oriented to her new environment. She denied all of the psychiatric symptoms including suicide. She spoke briefly about the FBI and maybe she should have contacted them.

## 2018-07-30 DIAGNOSIS — F22 Delusional disorders: Secondary | ICD-10-CM

## 2018-07-30 LAB — HEMOGLOBIN A1C
Hgb A1c MFr Bld: 4.9 % (ref 4.8–5.6)
MEAN PLASMA GLUCOSE: 93.93 mg/dL

## 2018-07-30 LAB — VITAMIN B12: VITAMIN B 12: 541 pg/mL (ref 180–914)

## 2018-07-30 LAB — LIPID PANEL
Cholesterol: 206 mg/dL — ABNORMAL HIGH (ref 0–200)
HDL: 58 mg/dL (ref >40)
LDL CALC: 134 mg/dL — AB (ref 0–99)
Total CHOL/HDL Ratio: 3.6 RATIO
Triglycerides: 68 mg/dL (ref <150)
VLDL: 14 mg/dL (ref 0–40)

## 2018-07-30 MED ORDER — RISPERIDONE 1 MG PO TABS
2.0000 mg | ORAL_TABLET | Freq: Every day | ORAL | Status: DC
  Administered 2018-07-30 – 2018-07-31 (×2): 2 mg via ORAL
  Filled 2018-07-30 (×2): qty 2

## 2018-07-30 NOTE — [facility] Group Notes (Signed)
[facility] LCSW Group Therapy  8/252019 1 pm  Type of Therapy:  Group Therapy:Open discussion and Promoting self care  Participation Level:  Did not attend  Jody Ishler LCSW [phone] 

## 2018-07-30 NOTE — Plan of Care (Signed)
Newly admitted, experiencing adjustment difficulties

## 2018-07-30 NOTE — H&P (Signed)
Psychiatric Admission Assessment Adult  Patient Identification: Jody Howard MRN:  [id number] Date of Evaluation:  07/30/2018 Chief Complaint:  Psychosis  Principal Diagnosis: Delusional disorder [facility]) Diagnosis:   Patient Active Problem List   Diagnosis Date Noted  . Delusional disorder ([place]) [F22] 07/28/2018    Priority: High  . Psychosis ([place]) [F29] 07/28/2018  . Mild dementia [F03.90] 07/28/2018  . Family history of breast cancer [Z80.3] 10/24/2016  . Right hip pain [M25.551] 07/23/2015  . Stress [F43.9] 01/21/2015  . Health care maintenance [Z00.00] 01/21/2015  . Female bladder prolapse [N81.10] 09/24/2014  . Vaginal bleeding [N93.9] 09/24/2014  . Abdominal pain, left lower quadrant [R10.32] 09/24/2014  . Left arm pain [M79.602] 04/07/2014  . Dysuria [R30.0] 06/14/2013  . Vaginal prolapse [N81.10] 06/06/2013  . Pure hypercholesterolemia [E78.00] 02/11/2013  . Bronchiectasis ([place]) [J47.9] 02/11/2013  . Atrial tachycardia ([place]) [I47.1] 01/30/2010   History of Present Illness:   Identifying data. Jody Howard is a 77 year old female with a history of delusional disorder.  Chief complaint. "Let me tell you."  History of present illness. Information was obtained from the patient and the chart. The patient was brought to the hospital by the police for causing public disturbance in a [place] where she was trying to pay her taxes. She became paranoid about a woman there. The patient spoke to numerous providers in the ER always tying to convince [place] that she is a victim of broad conspiracy involving banks, her family, her deceased husband's mistress, her husband himself, tennants of property she owns, police, neighbors, and now myself. The patient has logorrhea with pressured speech when attempts to interrupt her, unsuccessful, are being made.  She adamantly denies any symptoms of depression, anxiety or psychosis. She is not suicidal or homicidal. There is no  evident cognitive decline. She does not use drugs or alcohol. There is absolutely no insight into mental illness. She feels disappointed that I recommend hospitalization and antipsychotics. She is devastated to learn that even though I can feel and understand her anguish, I do not share her beliefs.   Past psychiatric history. According to the chart, her symptoms started many years ago but became a public nusance over time. With encouragement from family, she did see Dr. Toy Care in [place] but did not stay in treatment. There was one admission to [facility] in a similar scenario. The patient was started on Risperdal but did not continue taking it at home.   Family psychiatric histori. None.  Social history. The patient and her family have been the pillars of local community for years.  Total Time spent with patient: 1 hour  Is the patient at risk to self? No.  Has the patient been a risk to self in the past 6 months? No.  Has the patient been a risk to self within the distant past? No.  Is the patient a risk to others? No.  Has the patient been a risk to others in the past 6 months? No.  Has the patient been a risk to others within the distant past? No.   Prior Inpatient Therapy:   Prior Outpatient Therapy:    Alcohol Screening:   Substance Abuse History in the last 12 months:  No. Consequences of Substance Abuse: NA Previous Psychotropic Medications: Yes  Psychological Evaluations: No  Past Medical History:  Past Medical History:  Diagnosis Date  . Anemia   . Atrial tachycardia (HCC)    s/p ablation  . Clostridium difficile infection 1997   required hospitalization  .  Family history of breast cancer   . Hyperlipidemia   . Mitral valve prolapse   . Paroxysmal supraventricular tachycardia ([place])   . Rheumatic fever     Past Surgical History:  Procedure Laterality Date  . ABLATION     for atrial tachycardia  . BREAST BIOPSY  1997  . TONSILLECTOMY  1944   Family History:  Family  History  Problem Relation Age of Onset  . Breast cancer Mother 73  . Cancer Paternal Aunt        colon cancer  . Multiple myeloma Father        dx in his 92s  . Breast cancer Sister 32       d. 34  . Heart attack Sister        due to iodine reaction  . Breast cancer Sister 9       d. 40  . Breast cancer Cousin        dx twice, first time possibly under 83; mat first cousin  . Breast cancer Maternal Aunt        dx in her 9s    Tobacco Screening:   Social History:  Social History   Substance and Sexual Activity  Alcohol Use Yes  . Alcohol/week: 0.0 standard drinks   Comment: occasional     Social History   Substance and Sexual Activity  Drug Use No    Additional Social History:                           Allergies:   Allergies  Allergen Reactions  . Codeine Other (See Comments)    Unknown.  . Lipitor [Atorvastatin] Other (See Comments)    unknown  . Simvastatin Other (See Comments)    unknown  . Other Nausea And Vomiting    Has had c-diff in past and cipro causes severe vomiting  . Silicone Nausea And Vomiting  . Flagyl [Metronidazole] Other (See Comments)    unknown   Lab Results:  Results for orders placed or performed during the hospital encounter of 07/29/18 (from the past 48 hour(s))  Hemoglobin A1c     Status: None   Collection Time: 07/28/18  3:49 PM  Result Value Ref Range   Hgb A1c MFr Bld 4.9 4.8 - 5.6 %    Comment: (NOTE) Pre diabetes:          5.7%-6.4% Diabetes:              >6.4% Glycemic control for   <7.0% adults with diabetes    Mean Plasma Glucose 93.93 mg/dL    Comment: Performed at [facility], [place] [address], [place], [place] [address]  Lipid panel     Status: Abnormal   Collection Time: 07/28/18  3:49 PM  Result Value Ref Range   Cholesterol 206 (H) 0 - 200 mg/dL   Triglycerides 68 <150 mg/dL   HDL 58 >40 mg/dL   Total CHOL/HDL Ratio 3.6 RATIO   VLDL 14 0 - 40 mg/dL   LDL Cholesterol 134 (H) 0 - 99 mg/dL     Comment:        Total Cholesterol/HDL:CHD Risk Coronary Heart Disease Risk Table                     Men   Women  1/2 Average Risk   3.4   3.3  Average Risk       5.0  4.4  2 X Average Risk   9.6   7.1  3 X Average Risk  23.4   11.0        Use the calculated Patient Ratio above and the CHD Risk Table to determine the patient's CHD Risk.        ATP III CLASSIFICATION (LDL):  <100     mg/dL   Optimal  100-129  mg/dL   Near or Above                    Optimal  130-159  mg/dL   Borderline  160-189  mg/dL   High  >190     mg/dL   Very High Performed at [facility], [address] [place], [place] [address]     Blood Alcohol level:  Lab Results  Component Value Date   [facility] <10 99/83/3825    Metabolic Disorder Labs:  Lab Results  Component Value Date   HGBA1C 4.9 07/28/2018   MPG 93.93 07/28/2018   No results found for: PROLACTIN Lab Results  Component Value Date   CHOL 206 (H) 07/28/2018   TRIG 68 07/28/2018   HDL 58 07/28/2018   CHOLHDL 3.6 07/28/2018   VLDL 14 07/28/2018   LDLCALC 134 (H) 07/28/2018   LDLCALC 115 (H) 05/10/2018    Current Medications: Current Facility-Administered Medications  Medication Dose Route Frequency Provider Last Rate Last Dose  . acetaminophen (TYLENOL) tablet 650 mg  650 mg Oral Q6H PRN Clapacs, John T, MD      . alum & mag hydroxide-simeth (MAALOX/MYLANTA) 200-200-20 MG/5ML suspension 30 mL  30 mL Oral Q4H PRN Clapacs, John T, MD      . hydrOXYzine (ATARAX/VISTARIL) tablet 10 mg  10 mg Oral TID PRN Clapacs, John T, MD      . magnesium hydroxide (MILK OF MAGNESIA) suspension 30 mL  30 mL Oral Daily PRN Clapacs, John T, MD      . risperiDONE (RISPERDAL) tablet 2 mg  2 mg Oral QHS Pucilowska, Jolanta B, MD      . traZODone (DESYREL) tablet 50 mg  50 mg Oral QHS PRN Clapacs, Madie Reno, MD       PTA Medications: Medications Prior to Admission  Medication Sig Dispense Refill Last Dose  . CALCIUM PO Take 1 tablet by mouth  daily.    Taking  . Cholecalciferol (VITAMIN D-3 PO) Take 1 capsule by mouth daily. Taking 2000 IU daily   Taking  . CRESTOR 5 MG tablet TAKE 1/2 TABLET(2.5 MG) BY MOUTH DAILY 30 tablet 5 Taking  . meloxicam (MOBIC) 15 MG tablet Take 1 tablet by mouth daily.   Taking  . Multiple Vitamins-Minerals (MULTIVITAMIN PO) Take 1 tablet by mouth daily.    Taking    Musculoskeletal: Strength & Muscle Tone: within normal limits Gait & Station: normal Patient leans: N/A  Psychiatric Specialty Exam: Physical Exam  Nursing note and vitals reviewed. Constitutional: She is oriented to person, place, and time. She appears well-developed and well-nourished.  HENT:  Head: Normocephalic and atraumatic.  Eyes: Pupils are equal, round, and reactive to light. Conjunctivae and EOM are normal.  Neck: Normal range of motion. Neck supple.  Cardiovascular: Normal rate, regular rhythm and normal heart sounds.  Respiratory: Effort normal and breath sounds normal.  GI: Soft. Bowel sounds are normal.  Musculoskeletal: Normal range of motion.  Neurological: She is alert and oriented to person, place, and time.  Skin: Skin is warm and dry.  Psychiatric: Her mood appears anxious. Her speech is rapid and/or pressured. She is actively hallucinating. Thought content is paranoid and delusional. Cognition and memory are impaired. She expresses impulsivity.    Review of Systems  Neurological: Negative.   Psychiatric/Behavioral: Positive for hallucinations.  All other systems reviewed and are negative.   Blood pressure (!) 90/55, pulse 96, temperature (!) 97.4 F (36.3 C), temperature source Oral, height 5' 4.17" (1.63 m), weight 48.8 kg, last menstrual period 01/29/1983, SpO2 100 %.Body mass index is 18.35 kg/m.  See SRA                                                  Sleep:  Number of Hours: 6.5    Treatment Plan Summary: Daily contact with patient to assess and evaluate symptoms and  progress in treatment and Medication management   Ms. Hahne is a 77 year old female with a history of paranoid delusions admitted after creating a scene at a public place.  #Psychosis -patient was started on low dose Risperdal in the ER -increase Risperdal to 2 mg nightly -Trazodone 100 mg nightly PRN  #Labs, pending  #Disposition -discharge to her home -follow up with Dr. Nicolasa Ducking   Observation Level/Precautions:  15 minute checks  Laboratory:  CBC Chemistry Profile Folic Acid HCG UDS UA Vitamin B-12  Psychotherapy:    Medications:    Consultations:    Discharge Concerns:    Estimated LOS:  Other:     Physician Treatment Plan for Primary Diagnosis: Delusional disorder ([place]) Long Term Goal(s): Improvement in symptoms so as ready for discharge  Short Term Goals: Ability to identify changes in lifestyle to reduce recurrence of condition will improve, Ability to verbalize feelings will improve, Ability to disclose and discuss suicidal ideas, Ability to demonstrate self-control will improve, Ability to identify and develop effective coping behaviors will improve, Ability to maintain clinical measurements within normal limits will improve, Compliance with prescribed medications will improve and Ability to identify triggers associated with substance abuse/mental health issues will improve  Physician Treatment Plan for Secondary Diagnosis: Principal Problem:   Delusional disorder ([place]) Active Problems:   Psychosis ([place])  Long Term Goal(s): NA  Short Term Goals: NA  I certify that inpatient services furnished can reasonably be expected to improve the patient's condition.    Orson Slick, MD 8/25/20192:20 PM

## 2018-07-30 NOTE — Progress Notes (Addendum)
Patient stayed in bed, denial, sad and reporting that she was sent to the wrong  Unit. " Because there is nothing wrong with me, I can think right, I can bathe myself, I have been doing this for.Marland KitchenMarland KitchenMarland KitchenMarland Kitchenever. Experiencing episodes of paranoia and thought blocking. Took her bedtime medication saying "I guess I have to take it". Patient asked many questions regarding this unit and information was given.  Stayed in bed and currently sleeping. Safety precautions reinforced.   0700: Patient woke up early and presented to the nurses station complaining that she is missing her diamond ring and neckless. She also reports that she is missing her purse and bank cards, as well as a pair of shoes.She gave permission to contact her son Jeannie Fend at [phone]. Phone call was made at multiple attempts but no response. Voicemail message was left at [phone] and  oncoming nurse was informed for follow up.

## 2018-07-30 NOTE — BHH Suicide Risk Assessment (Signed)
[facility] Admission Suicide Risk Assessment   Nursing information obtained from:  Patient Demographic factors:  Divorced or widowed, Age 77 or older, Caucasian, Living alone Current Mental Status:  NA Loss Factors:  Loss of significant relationship Historical Factors:  NA Risk Reduction Factors:  Positive social support, Positive coping skills or problem solving skills, Positive therapeutic relationship  Total Time spent with patient: 1 hour Principal Problem: Delusional disorder ([place]) Diagnosis:   Patient Active Problem List   Diagnosis Date Noted  . Delusional disorder ([employer] [place]) [F22] 07/28/2018    Priority: High  . Psychosis ([place]) [F29] 07/28/2018  . Mild dementia [F03.90] 07/28/2018  . Family history of breast cancer [Z80.3] 10/24/2016  . Right hip pain [M25.551] 07/23/2015  . Stress [F43.9] 01/21/2015  . Health care maintenance [Z00.00] 01/21/2015  . Female bladder prolapse [N81.10] 09/24/2014  . Vaginal bleeding [N93.9] 09/24/2014  . Abdominal pain, left lower quadrant [R10.32] 09/24/2014  . Left arm pain [M79.602] 04/07/2014  . Dysuria [R30.0] 06/14/2013  . Vaginal prolapse [N81.10] 06/06/2013  . Pure hypercholesterolemia [E78.00] 02/11/2013  . Bronchiectasis ([place]) [J47.9] 02/11/2013  . Atrial tachycardia ([place]) [I47.1] 01/30/2010   Subjective Data: paranoid delusions  Continued Clinical Symptoms:    The "Alcohol Use Disorders Identification Test", Guidelines for Use in Primary Care, Second Edition.  World Pharmacologist [facility]). Score between 0-7:  no or low risk or alcohol related problems. Score between 8-15:  moderate risk of alcohol related problems. Score between 16-19:  high risk of alcohol related problems. Score 20 or above:  warrants further diagnostic evaluation for alcohol dependence and treatment.   CLINICAL FACTORS:   Currently Psychotic   Musculoskeletal: Strength & Muscle Tone: within normal limits Gait & Station: normal Patient leans:  N/A  Psychiatric Specialty Exam: Physical Exam  Nursing note and vitals reviewed. Psychiatric: Her mood appears anxious. Her speech is rapid and/or pressured. She is actively hallucinating. Thought content is paranoid and delusional. Cognition and memory are impaired. She expresses impulsivity.    Review of Systems  Neurological: Negative.   Psychiatric/Behavioral: Positive for hallucinations.  All other systems reviewed and are negative.   Blood pressure (!) 90/55, pulse 96, temperature (!) 97.4 F (36.3 C), temperature source Oral, height 5' 4.17" (1.63 m), weight 48.8 kg, last menstrual period 01/29/1983, SpO2 100 %.Body mass index is 18.35 kg/m.  General Appearance: Casual  Eye Contact:  Good  Speech:  Clear and Coherent and Pressured  Volume:  Normal  Mood:  Anxious and Irritable  Affect:  Congruent  Thought Process:  Irrelevant and Descriptions of Associations: Tangential  Orientation:  Other:  person and place  Thought Content:  Illogical, Delusions, Paranoid Ideation and Rumination  Suicidal Thoughts:  No  Homicidal Thoughts:  No  Memory:  Immediate;   Poor Recent;   Poor Remote;   Poor  Judgement:  Poor  Insight:  Lacking  Psychomotor Activity:  Normal  Concentration:  Concentration: Poor and Attention Span: Poor  Recall:  Poor  Fund of Knowledge:  Fair  Language:  Fair  Akathisia:  No  Handed:  Right  AIMS (if indicated):     Assets:  Communication Skills Desire for Improvement Financial Resources/Insurance [place] Talents/Skills Transportation Vocational/Educational  ADL's:  Intact  Cognition:  WNL  Sleep:  Number of Hours: 6.5      COGNITIVE FEATURES THAT CONTRIBUTE TO RISK:  Closed-mindedness    SUICIDE RISK:   Moderate:  Frequent suicidal ideation with limited intensity, and duration, some  specificity in terms of plans, no associated intent, good self-control, limited dysphoria/symptomatology, some  risk factors present, and identifiable protective factors, including available and accessible social support.  PLAN OF CARE: hospital admission, medication management, discharge planning.  Jody Howard is a 77 year old female with a history of paranoid delusions admitted after creating a scene at a public place.  #Psychosis -patient was started on low dose Risperdal in the ER -increase Risperdal to 2 mg nightly -Trazodone 100 mg nightly PRN  #Labs, pending  #Disposition -discharge to her home -follow up with Dr. Nicolasa Ducking    I certify that inpatient services furnished can reasonably be expected to improve the patient's condition.   Jody Slick, MD 07/30/2018, 2:14 PM

## 2018-07-30 NOTE — BHH Group Notes (Signed)
[facility] Group Notes:  (Nursing/MHT/Case Management/Adjunct)  Date:  07/30/2018  Time:  9:45 PM  Type of Therapy:  Group Therapy  Participation Level:  Active  Participation Quality:  Appropriate  Affect:  Appropriate  Cognitive:  Appropriate  Insight:  Good  Engagement in Group:  Engaged  Modes of Intervention:  Support  Summary of Progress/Problems:  Jody Howard 07/30/2018, 9:45 PM

## 2018-07-30 NOTE — Progress Notes (Signed)
Received Jody Howard this AM after breakfast, she was anxious to talk with the doctor. After talking with the doctor, she was tearful related to being questioned about her thoughts. The doctor recommended her to take medications. She retreated to her room. Earlier she has been OOB in the milieu at intervals and socializing with select peers.  She joined her peers for dinner and afterwards played cards with them.

## 2018-07-31 LAB — URINE DRUG SCREEN, QUALITATIVE (ARMC ONLY)
AMPHETAMINES, UR SCREEN: NOT DETECTED
Barbiturates, Ur Screen: NOT DETECTED
Cannabinoid 50 Ng, Ur ~~LOC~~: NOT DETECTED
Cocaine Metabolite,Ur ~~LOC~~: NOT DETECTED
MDMA (ECSTASY) UR SCREEN: NOT DETECTED
METHADONE SCREEN, URINE: NOT DETECTED
Opiate, Ur Screen: NOT DETECTED
Phencyclidine (PCP) Ur S: NOT DETECTED
TRICYCLIC, UR SCREEN: NOT DETECTED

## 2018-07-31 LAB — RPR: RPR: NONREACTIVE

## 2018-07-31 MED ORDER — DIVALPROEX SODIUM 250 MG PO DR TAB
250.0000 mg | DELAYED_RELEASE_TABLET | Freq: Two times a day (BID) | ORAL | Status: DC
  Administered 2018-07-31 – 2018-08-06 (×12): 250 mg via ORAL
  Filled 2018-07-31 (×13): qty 1

## 2018-07-31 MED ORDER — RISPERIDONE 1 MG PO TABS
1.0000 mg | ORAL_TABLET | ORAL | Status: DC
  Administered 2018-07-31 – 2018-08-01 (×2): 1 mg via ORAL
  Filled 2018-07-31 (×2): qty 1

## 2018-07-31 NOTE — Plan of Care (Signed)
Problem: Safety: Goal: Periods of time without injury will increase Outcome: Progressing D: Pt ambulatory in milieu with a slow but steady gait. A & O to self and place "hospital". Presents guarded but pleasant on interactions though confused / delusional. Denies SI, HI, AVH and pain when assessed "I will never hurt myself". Started on Depakote and Risperdal as ordered (see emar). Per pt "so they decided to give me medicines now because I saw some terrible things with my husband, he was a good man but he did bad things, you know this is [place] and you have to teach your children to take responsibility, they're giving me medications for not talking, medicines make me sick". Reports she's eating and sleeping well. A: Emotional support and availability provided to pt on as need basis. Medication administered as ordered with verbal education and effects monitored. Urine sample obtained for U/A / UDS and sent to lab this shift. Pt updated on changes to care plan. Safety checks maintained without self harm gestures or outburst.  R: Pt has been cooperative with care. Compliant with medications when offered. Denies adverse drug reactions thus far. Pt is in agreement with changes (new meds) made to care plan. POC continues for safety and mood stability.

## 2018-07-31 NOTE — Progress Notes (Signed)
Recreation Therapy Notes  INPATIENT RECREATION THERAPY ASSESSMENT  Patient Details Name: Jody Howard MRN: [id number] DOB: 02/27/1941 Today's Date: 07/31/2018       Information Obtained From: (Patient unable to focus on topic to complete assessment)  Able to Participate in Assessment/Interview:    Patient Presentation:    Reason for Admission (Per Patient):    Patient Stressors:    Coping Skills:      Leisure Interests (2+):     Frequency of Recreation/Participation:    Awareness of Community Resources:     [employer]:     Current Use:    If no, Barriers?:    Expressed Interest in [place] of Residence:     Patient Main Form of Transportation:    Patient Strengths:     Patient Identified Areas of Improvement:     Patient Goal for Hospitalization:     Current SI (including self-harm):     Current HI:     Current AVH:    Staff Intervention Plan:    Consent to Intern Participation:    Teanna Elem 07/31/2018, 4:37 PM
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
## 2018-07-31 NOTE — BHH Suicide Risk Assessment (Signed)
[place] INPATIENT:  Family/Significant Other Suicide Prevention Education  Suicide Prevention Education:  Contact Attempts:Creighton V., Son, [phone] has been identified by the patient as the family member/significant other with whom the patient will be residing, and identified as the person(s) who will aid the patient in the event of a mental health crisis.  With written consent from the patient, two attempts were made to provide suicide prevention education, prior to and/or following the patient's discharge.  We were unsuccessful in providing suicide prevention education.  A suicide education pamphlet was given to the patient to share with family/significant other.  Date and time of first attempt: CSW attempted to contact the patients family member/ significant other to complete SPE and obtain collateral information. CSW left a voicemail with a callback number on 07/31/2018 3:43 PM   Date and time of second attempt:  Darin Engels 07/31/2018, 3:43 PM

## 2018-07-31 NOTE — Progress Notes (Addendum)
Recreation Therapy Notes  Date: 07/31/2018  Time: 9:30 am  Location: Craft Room  Behavioral response: Appropriate   Intervention Topic: Problem Solving  Discussion/Intervention:  Group content on today was focused on problem solving. The group described what problem solving is. Patients expressed how problems affect them and how they deal with problems. Individuals identified healthy ways to deal with problems. Patients explained what normally happens to them when they do not deal with problems. The group expressed reoccurring problems for them. The group participated in the intervention "Ways to Solve problems" where patients were given a chance to explore different ways to solve problems.  Clinical Observations/Feedback:  Patient attended group but was pulled from group by the doctor. Valaree Fresquez LRT/CTRS         Hinata Diener 07/31/2018 12:42 PM

## 2018-07-31 NOTE — Progress Notes (Signed)
Patient remained cooperative throughout the shift. However, she continues to experience paranoia and repeating that she did not have to come here. At the beginning of the shift, patient was in the dayroom, playing cards with peers. She attended HS group and remained appropriate. Discussed with this writer about her concerns: reported that she did not like the conversation that she had with MD and stated " I can't wait to speak with my actual [place]". Patient was to give a urine sample for UA and was willing to do it. However, patient was unable to give enough quantity and agreed to try again later. She is currently awake and has no concern. Support and encouragements provided.

## 2018-07-31 NOTE — BHH Counselor (Signed)
Adult Comprehensive Assessment  Patient ID: Jody Howard, female   DOB: 1941-08-17, 77 y.o.   MRN: [id number]  Information Source: Information source: Patient  Current Stressors:  Patient states their primary concerns and needs for treatment are:: "I'm not exactly sure. I went to pay my taxes and then I ended up here." Patient states their goals for this hospitilization and ongoing recovery are:: "To get out of here." Educational / Learning stressors: None Reported Employment / Job issues: None Reported Family Relationships: None Reported Museum/gallery curator / Lack of resources (include bankruptcy): None Reported Housing / Lack of housing: None Reported Physical health (include injuries & life threatening diseases): None Reported Social relationships: Pt. reports that there are people trying to hack her bank account and phone.  Substance abuse: None Reported Bereavement / Loss: None Reported  Living/Environment/Situation:  Living Arrangements: Alone Living conditions (as described by patient or guardian): [place]  Who else lives in the home?: Alone How long has patient lived in current situation?: Since 2009 What is atmosphere in current home: Comfortable  Family History:  Marital status: Widowed Widowed, when?: 2015 Are you sexually active?: No What is your sexual orientation?: Heterosexual Has your sexual activity been affected by drugs, alcohol, medication, or emotional stress?: N/A Does patient have children?: Yes How many children?: 2 How is patient's relationship with their children?: Pt. reports "It was just fine."  Childhood History:  By whom was/is the patient raised?: Both parents Description of patient's relationship with caregiver when they were a child: Pt. reports "It couldnt have been better." [place]. Father worked for a Orthoptist and mother did all of the filing. Patient's description of current relationship with people who raised him/her:  Both parents are diseased. How were you disciplined when you got in trouble as a child/adolescent?: Pt. reports "We got a few spankings and talking to get." Does patient have siblings?: Yes Number of Siblings: 2 Description of patient's current relationship with siblings: Both sisters, both have tramatic injuries, both diseased. Did patient suffer any verbal/emotional/physical/sexual abuse as a child?: No Did patient suffer from severe childhood neglect?: No Has patient ever been sexually abused/assaulted/raped as an adolescent or adult?: No Was the patient ever a victim of a crime or a disaster?: No Witnessed domestic violence?: No Has patient been effected by domestic violence as an adult?: No  Education:  Highest grade of school patient has completed: Graduated from college and Has a degree in Personnel officer. 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 Pt. denies.   Social Support System:   Pensions consultant Support System: Good Describe Community Support System: Tax adviser and Ardelle Balls- friends. Type of faith/religion: "I belong to [employer]." How does patient's faith help to cope with current illness?: "I havent gone there in awhile due to taking my friend to her  shows."  Leisure/Recreation:   Leisure and Hobbies: Taking care of neice and friend.  Strengths/Needs:   What is the patient's perception of their strengths?: Taking care of neice and friend. Patient states they can use these personal strengths during their treatment to contribute to their recovery: Unknown Patient states these barriers may affect/interfere with their treatment: None Patient states these barriers may affect their return to the community: None Other important information patient would like considered in planning for their treatment: None  Discharge Plan:   Currently receiving community mental health services: No Patient states concerns and preferences for aftercare planning are: None Patient states they will know when they are safe and ready for discharge when: "I'm ready to go." Does patient have access to transportation?: Yes(Son) Does patient have financial barriers related to discharge medications?: No Patient description of barriers related to discharge medications: None Will patient be returning to same living situation after discharge?: Yes  Summary/Recommendations:   Summary and Recommendations (to be completed by the evaluator): Patient is a 77 year old female admitted involuntarily and diagnosed with Delusional disorder. Patient denies any current stressors at this time. She is unsure of why she is here. Patient lives alone in [place]. She denies any alcohol/ substance abuse. At discharge, patient wants to return home to her family and attend outpatient treatment. While here, patient will benefit from crisis stabilization, medication evaluation, group therapy and psychoeducation, in addition to case management for discharge planning. At discharge, it is recommended that patient remain compliant with the established discharge plan and continue treatment.  Darin Engels. 07/31/2018

## 2018-07-31 NOTE — Progress Notes (Signed)
   07/31/18 1105  Clinical Encounter Type  Visited With Patient  Visit Type Initial;Spiritual support;Behavioral Health  Referral From Physician;Social work  Consult/Referral To Chaplain  Spiritual Encounters  Spiritual Needs Emotional;Other (Comment)   [facility] set in on the patient's treatment team meeting. Ms. Mostafa was polite and appropriate but did seem to ramble about being in the [facility]. 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 I was not able to understand what was accurate and what was fantasy. Ms.Andreoni shared the responsibilities that she has at home caring for family. I will follow up with patient as needed.

## 2018-07-31 NOTE — BHH Suicide Risk Assessment (Signed)
[place] INPATIENT:  Family/Significant Other Suicide Prevention Education  Suicide Prevention Education:  Education Completed; Gwenette Greet, [phone] has been identified by the patient as the family member/significant other with whom the patient will be residing, and identified as the person(s) who will aid the patient in the event of a mental health crisis (suicidal ideations/suicide attempt).  With written consent from the patient, the family member/significant other has been provided the following suicide prevention education, prior to the and/or following the discharge of the patient.  The suicide prevention education provided includes the following:  Suicide risk factors  Suicide prevention and interventions  National Suicide Hotline telephone number  [facility] assessment telephone number  [facility] Emergency Assistance [place] and/or Residential Mobile Crisis Unit telephone number  Request made of family/significant other to:  Remove weapons (e.g., guns, rifles, knives), all items previously/currently identified as safety concern.    Remove drugs/medications (over-the-counter, prescriptions, illicit drugs), all items previously/currently identified as a safety concern.  The family member/significant other verbalizes understanding of the suicide prevention education information provided.  The family member/significant other agrees to remove the items of safety concern listed above.   CSW spoke with the patients son regarding precipitating events that led to her admission. Son reports that the patient has been having these delusions/parania for about 15 years ago, around the time her husband got diagnosed with cancer. In 2013/2014 she had a bad break and was IVC's to [facility] hospital. He said that she never followed up with a psychiatrist and take her medications. He reports that after her husband died in 2014/03/31, she got worse. He says that  he is going to work towards pursing guardianship. He reports that him and his sister have POA over her. He says that he the patient has a pistol in the home but he doesn't know where it is. He states that once he finds it, he will remove it.  Darin Engels 07/31/2018, 4:01 PM

## 2018-07-31 NOTE — Tx Team (Addendum)
Interdisciplinary Treatment and Diagnostic Plan Update  07/31/2018 Time of Session: 11am Jody Howard MRN: [id number]  Principal Diagnosis: Delusional disorder [facility])  Secondary Diagnoses: Principal Problem:   Delusional disorder ([place]) Active Problems:   Psychosis ([place])   Current Medications:  Current Facility-Administered Medications  Medication Dose Route Frequency Provider Last Rate Last Dose  . acetaminophen (TYLENOL) tablet 650 mg  650 mg Oral Q6H PRN Clapacs, John T, MD      . alum & mag hydroxide-simeth (MAALOX/MYLANTA) 200-200-20 MG/5ML suspension 30 mL  30 mL Oral Q4H PRN Clapacs, John T, MD      . divalproex (DEPAKOTE) DR tablet 250 mg  250 mg Oral Q12H McNew, Holly R, MD   250 mg at 07/31/18 1119  . hydrOXYzine (ATARAX/VISTARIL) tablet 10 mg  10 mg Oral TID PRN Clapacs, John T, MD      . magnesium hydroxide (MILK OF MAGNESIA) suspension 30 mL  30 mL Oral Daily PRN Clapacs, John T, MD      . risperiDONE (RISPERDAL) tablet 1 mg  1 mg Oral BH-q7a McNew, Tyson Babinski, MD   1 mg at 07/31/18 1120  . risperiDONE (RISPERDAL) tablet 2 mg  2 mg Oral QHS Pucilowska, Jolanta B, MD   2 mg at 07/30/18 2123  . traZODone (DESYREL) tablet 50 mg  50 mg Oral QHS PRN Clapacs, Madie Reno, MD       PTA Medications: Medications Prior to Admission  Medication Sig Dispense Refill Last Dose  . CALCIUM PO Take 1 tablet by mouth daily.    Taking  . Cholecalciferol (VITAMIN D-3 PO) Take 1 capsule by mouth daily. Taking 2000 IU daily   Taking  . CRESTOR 5 MG tablet TAKE 1/2 TABLET(2.5 MG) BY MOUTH DAILY 30 tablet 5 Taking  . meloxicam (MOBIC) 15 MG tablet Take 1 tablet by mouth daily.   Taking  . Multiple Vitamins-Minerals (MULTIVITAMIN PO) Take 1 tablet by mouth daily.    Taking    Patient Stressors: Loss of spouse Other: declining cognitively   Patient Strengths: Average or above average intelligence Communication skills Financial means Physical Health Supportive family/friends  Treatment  Modalities: Medication Management, Group therapy, Case management,  1 to 1 session with clinician, Psychoeducation, Recreational therapy.   Physician Treatment Plan for Primary Diagnosis: Delusional disorder [facility]) Long Term Goal(s): Improvement in symptoms so as ready for discharge NA   Short Term Goals: Ability to identify changes in lifestyle to reduce recurrence of condition will improve Ability to verbalize feelings will improve Ability to disclose and discuss suicidal ideas Ability to demonstrate self-control will improve Ability to identify and develop effective coping behaviors will improve Ability to maintain clinical measurements within normal limits will improve Compliance with prescribed medications will improve Ability to identify triggers associated with substance abuse/mental health issues will improve NA  Medication Management: Evaluate patient's response, side effects, and tolerance of medication regimen.  Therapeutic Interventions: 1 to 1 sessions, Unit Group sessions and Medication administration.  Evaluation of Outcomes: Progressing  Physician Treatment Plan for Secondary Diagnosis: Principal Problem:   Delusional disorder ([place]) Active Problems:   Psychosis ([place])  Long Term Goal(s): Improvement in symptoms so as ready for discharge NA   Short Term Goals: Ability to identify changes in lifestyle to reduce recurrence of condition will improve Ability to verbalize feelings will improve Ability to disclose and discuss suicidal ideas Ability to demonstrate self-control will improve Ability to identify and develop effective coping behaviors will improve Ability to maintain clinical measurements  within normal limits will improve Compliance with prescribed medications will improve Ability to identify triggers associated with substance abuse/mental health issues will improve NA     Medication Management: Evaluate patient's response, side effects, and tolerance of  medication regimen.  Therapeutic Interventions: 1 to 1 sessions, Unit Group sessions and Medication administration.  Evaluation of Outcomes: Progressing   RN Treatment Plan for Primary Diagnosis: Delusional disorder ([place]) Long Term Goal(s): Knowledge of disease and therapeutic regimen to maintain health will improve  Short Term Goals: Ability to participate in decision making will improve, Ability to identify and develop effective coping behaviors will improve and Compliance with prescribed medications will improve  Medication Management: RN will administer medications as ordered by provider, will assess and evaluate patient's response and provide education to patient for prescribed medication. RN will report any adverse and/or side effects to prescribing provider.  Therapeutic Interventions: 1 on 1 counseling sessions, Psychoeducation, Medication administration, Evaluate responses to treatment, Monitor vital signs and CBGs as ordered, Perform/monitor CIWA, COWS, AIMS and Fall Risk screenings as ordered, Perform wound care treatments as ordered.  Evaluation of Outcomes: Progressing   LCSW Treatment Plan for Primary Diagnosis: Delusional disorder [facility]) Long Term Goal(s): Safe transition to appropriate next level of care at discharge, Engage patient in therapeutic group addressing interpersonal concerns.  Short Term Goals: Engage patient in aftercare planning with referrals and resources, Increase social support, Facilitate patient progression through stages of change regarding substance use diagnoses and concerns, Identify triggers associated with mental health/substance abuse issues and Increase skills for wellness and recovery  Therapeutic Interventions: Assess for all discharge needs, 1 to 1 time with Social worker, Explore available resources and support systems, Assess for adequacy in community support network, Educate family and significant other(s) on suicide prevention, Complete  Psychosocial Assessment, Interpersonal group therapy.  Evaluation of Outcomes: Progressing   Progress in Treatment: Attending groups: Yes. Participating in groups: Yes. Taking medication as prescribed: Yes. Toleration medication: Yes. Family/Significant other contact made: No, will contact:  Patients family if consent is given Patient understands diagnosis: No. Discussing patient identified problems/goals with staff: Yes. Medical problems stabilized or resolved: Yes. Denies suicidal/homicidal ideation: Yes. Issues/concerns per patient self-inventory: No. Other:   New problem(s) identified: No, Describe:  None  New Short Term/Long Term Goal(s): "To go home."  Patient Goals:  "To go home."  Discharge Plan or Barriers: To return home and follow up with outpatient services.  Reason for Continuation of Hospitalization: Delusions  Medication stabilization  Estimated Length of Stay: 7 days   Recreational Therapy: Patient Stressors: N/A Patient Goal: Patient will focus on task/topic with 2 prompts from staff within 5 recreation therapy group sessions  Attendees: Patient: Jody Howard 07/31/2018 11:57 AM  Physician: Dr. Amador Cunas, MD 07/31/2018 11:57 AM  Nursing: West Pugh RN 07/31/2018 11:57 AM  RN Care Manager: 07/31/2018 11:57 AM  Social Worker: Gary Fleet 07/31/2018 11:57 AM  Recreational Therapist: Isaias Sakai. Marcello Fennel, LRT 07/31/2018 11:57 AM  Other: Nance Pew, LCSW 07/31/2018 11:57 AM  Other: Marney Doctor, [place] 07/31/2018 11:57 AM  Other: 07/31/2018 11:57 AM    Scribe for Treatment Team: Darin Engels, LCSW 07/31/2018 11:57 AM

## 2018-07-31 NOTE — Plan of Care (Signed)
Paranoid but able to program in the milieu.

## 2018-08-01 MED ORDER — RISPERIDONE 1 MG PO TABS
2.0000 mg | ORAL_TABLET | Freq: Once | ORAL | Status: AC
  Administered 2018-08-01: 2 mg via ORAL
  Filled 2018-08-01: qty 2

## 2018-08-01 MED ORDER — RISPERIDONE 1 MG PO TABS: 1.5000 mg | ORAL_TABLET | Freq: Two times a day (BID) | ORAL | Status: DC

## 2018-08-01 MED ORDER — PALIPERIDONE ER 3 MG PO TB24
6.0000 mg | ORAL_TABLET | Freq: Every day | ORAL | Status: DC
  Administered 2018-08-02: 6 mg via ORAL
  Filled 2018-08-01 (×3): qty 2

## 2018-08-01 NOTE — Progress Notes (Signed)
[facility] MD Progress Note  08/01/2018 11:12 AM Jody Howard  MRN:  [id number] Subjective:  Pt continues to be paranoid and delusional. She refused to take Depakote this morning stating that she does not like medications and she is feeling dizzy. She did take the Risperdal. She states "I've had some things done to my heart." She is unable to state what she had done in the past. She states, "I just don't like medications."  She continues to perseverate on not needing to be here and feeling taht she was set up. She states that everyone is blaming her because she knows things that her husband has done in the past. She does redirect from these delusions better today. I asked her about a gun that she mentioned. She became very upset stating, "I don't have a gun! Someone is setting me up." She then stated that years ago she found a gun in the drawer that was her husbands. It had 2 bullets.  She states, "I got so scared because I don't like guns. I slept in my car that night." She states that she put it in a bag and in a lock box at the bank. She states, "I never go there and I don't want it." Discussed that this gun will need to be taken. She states that she is completely fine with this and will go with her son to the bank to get it out when she discharges. She states, "I would never hurt anyone." She continues to perseverate on feeling set up about the gun and being blamed for things taht has landed her in the hospital. I administered MOCA to evaluate for any memory or dementia issues. She scored 24/30. She had some issues with delayed recall but did well on the rest of the exam. She adamantly denies SI or HI. She has been out on the unit and socializing well with peers. She did well in group yesterday.    Principal Problem: Delusional disorder [facility]) Diagnosis:   Patient Active Problem List   Diagnosis Date Noted  . Psychosis ([place]) [F29] 07/28/2018  . Mild dementia [F03.90] 07/28/2018  . Delusional disorder  ([place]) [F22] 07/28/2018  . Family history of breast cancer [Z80.3] 10/24/2016  . Right hip pain [M25.551] 07/23/2015  . Stress [F43.9] 01/21/2015  . Health care maintenance [Z00.00] 01/21/2015  . Female bladder prolapse [N81.10] 09/24/2014  . Vaginal bleeding [N93.9] 09/24/2014  . Abdominal pain, left lower quadrant [R10.32] 09/24/2014  . Left arm pain [M79.602] 04/07/2014  . Dysuria [R30.0] 06/14/2013  . Vaginal prolapse [N81.10] 06/06/2013  . Pure hypercholesterolemia [E78.00] 02/11/2013  . Bronchiectasis ([place]) [J47.9] 02/11/2013  . Atrial tachycardia ([place]) [I47.1] 01/30/2010   Total Time spent with patient: 30 minutes  Past Psychiatric History: See H&P  Past Medical History:  Past Medical History:  Diagnosis Date  . Anemia   . Atrial tachycardia ([facility])    s/p ablation  . Clostridium difficile infection 1997   required hospitalization  . Family history of breast cancer   . Hyperlipidemia   . Mitral valve prolapse   . Paroxysmal supraventricular tachycardia ([place])   . Rheumatic fever     Past Surgical History:  Procedure Laterality Date  . ABLATION     for atrial tachycardia  . BREAST BIOPSY  1997  . TONSILLECTOMY  1944   Family History:  Family History  Problem Relation Age of Onset  . Breast cancer Mother 47  . Cancer Paternal Aunt  colon cancer  . Multiple myeloma Father        dx in his 42s  . Breast cancer Sister 37       d. 66  . Heart attack Sister        due to iodine reaction  . Breast cancer Sister 42       d. 49  . Breast cancer Cousin        dx twice, first time possibly under 66; mat first cousin  . Breast cancer Maternal Aunt        dx in her 57s   Family Psychiatric  History: See H&P Social History:  Social History   Substance and Sexual Activity  Alcohol Use Yes  . Alcohol/week: 0.0 standard drinks   Comment: occasional     Social History   Substance and Sexual Activity  Drug Use No    Social History   Socioeconomic  History  . Marital status: Widowed    Spouse name: Not on file  . Number of children: 2  . Years of education: Not on file  . Highest education level: Not on file  Occupational History  . Not on file  Social Needs  . Financial resource strain: Not on file  . Food insecurity:    Worry: Not on file    Inability: Not on file  . Transportation needs:    Medical: Not on file    Non-medical: Not on file  Tobacco Use  . Smoking status: Never Smoker  . Smokeless tobacco: Never Used  Substance and Sexual Activity  . Alcohol use: Yes    Alcohol/week: 0.0 standard drinks    Comment: occasional  . Drug use: No  . Sexual activity: Not Currently  Lifestyle  . Physical activity:    Days per week: Not on file    Minutes per session: Not on file  . Stress: Not on file  Relationships  . Social connections:    Talks on phone: Not on file    Gets together: Not on file    Attends religious service: Not on file    Active member of club or organization: Not on file    Attends meetings of clubs or organizations: Not on file    Relationship status: Not on file  Other Topics Concern  . Not on file  Social History Narrative  . Not on file   Additional Social History:                         Sleep: Good  Appetite:  Good  Current Medications: Current Facility-Administered Medications  Medication Dose Route Frequency Provider Last Rate Last Dose  . acetaminophen (TYLENOL) tablet 650 mg  650 mg Oral Q6H PRN Clapacs, John T, MD      . alum & mag hydroxide-simeth (MAALOX/MYLANTA) 200-200-20 MG/5ML suspension 30 mL  30 mL Oral Q4H PRN Clapacs, John T, MD      . divalproex (DEPAKOTE) DR tablet 250 mg  250 mg Oral Q12H Teila Skalsky, Tyson Babinski, MD   250 mg at 07/31/18 2000  . hydrOXYzine (ATARAX/VISTARIL) tablet 10 mg  10 mg Oral TID PRN Clapacs, John T, MD      . magnesium hydroxide (MILK OF MAGNESIA) suspension 30 mL  30 mL Oral Daily PRN Clapacs, John T, MD      . risperiDONE (RISPERDAL)  tablet 1 mg  1 mg Oral BH-q7a Shanara Schnieders, Tyson Babinski, MD   1 mg at 08/01/18  0608  . risperiDONE (RISPERDAL) tablet 2 mg  2 mg Oral QHS Pucilowska, Jolanta B, MD   2 mg at 07/31/18 2200  . traZODone (DESYREL) tablet 50 mg  50 mg Oral QHS PRN Clapacs, Madie Reno, MD        Lab Results: No results found for this or any previous visit (from the past 48 hour(s)).  Blood Alcohol level:  Lab Results  Component Value Date   ETH <10 75/17/0017    Metabolic Disorder Labs: Lab Results  Component Value Date   HGBA1C 4.9 07/28/2018   MPG 93.93 07/28/2018   No results found for: PROLACTIN Lab Results  Component Value Date   CHOL 206 (H) 07/28/2018   TRIG 68 07/28/2018   HDL 58 07/28/2018   CHOLHDL 3.6 07/28/2018   VLDL 14 07/28/2018   LDLCALC 134 (H) 07/28/2018   LDLCALC 115 (H) 05/10/2018    Physical Findings: AIMS: Facial and Oral Movements Muscles of Facial Expression: None, normal Lips and Perioral Area: None, normal Jaw: None, normal Tongue: None, normal,Extremity Movements Upper (arms, wrists, hands, fingers): None, normal Lower (legs, knees, ankles, toes): None, normal, Trunk Movements Neck, shoulders, hips: None, normal, Overall Severity Severity of abnormal movements (highest score from questions above): None, normal Incapacitation due to abnormal movements: None, normal Patient's awareness of abnormal movements (rate only patient's report): No Awareness, Dental Status Current problems with teeth and/or dentures?: No Does patient usually wear dentures?: No  CIWA:    COWS:     Musculoskeletal: Strength & Muscle Tone: within normal limits Gait & Station: normal Patient leans: N/A  Psychiatric Specialty Exam: Physical Exam  Nursing note and vitals reviewed.   Review of Systems  Neurological: Positive for dizziness.  All other systems reviewed and are negative.   Blood pressure (!) 97/56, pulse (!) 101, temperature 97.8 F (36.6 C), temperature source Oral, resp. rate 16,  height 5' 4.17" (1.63 m), weight 48.8 kg, last menstrual period 01/29/1983, SpO2 100 %.Body mass index is 18.35 kg/m.  General Appearance: Casual  Eye Contact:  Good  Speech:  Slow, less pressured today and able to redirect better  Volume:  Normal  Mood:  Euthymic  Affect:  Appropriate  Thought Process:  Disorganized  Orientation:  Full (Time, Place, and Person)  Thought Content:  Illogical, Delusions and Paranoid Ideation  Suicidal Thoughts:  No  Homicidal Thoughts:  No  Memory:  25/30 on MOCA  Judgement:  Impaired  Insight:  Lacking  Psychomotor Activity:  Normal  Concentration:  Concentration: Poor  Recall:  Poor  Fund of Knowledge:  Good  Language:  Fair  Akathisia:  No      Assets:  Resilience  ADL's:  Intact  Cognition:  Impaired,  Mild  Sleep:  Number of Hours: 7.15     Treatment Plan Summary: 77 yo female admitted due to paranoia and severe delusions. She continues to be paranoid and delusional but slightly less perseverative on the delusions today. She is ambivalent about taking medications. She has no insight into her illness or need for medications. Would benefit from LAI prior to discharge as the level of delusions and paranoia have interfered with her daily functioning. Collateral from family suggest possible manic symptoms in the past including decreased need for sleep, writing multiple page letters to the government, delusions, pressured speech. Paranoia and delusions have been prominent for many years. Differential including schizoaffective disorder vs. Bipolar disorder. She does not appear manic currently and is sleeping well.   Plan:  Psychosis/Mood -Will given Risperdal 2 mg tonight and switch to [employer].  -Will switch medication to oral INvega with plans to give Kirt Boys if tolerates oral. Start Invega 6 mg daily tomorrow -Continue Depakote 250 mg BID -Will consider LAI prior to discharge if she is tolerating oral medications  Dispo -She  will return home when stable. Her son is pursuing guardianship Marylin Crosby, MD 08/01/2018, 11:12 AM

## 2018-08-01 NOTE — [facility] Group Notes (Signed)
[place] Group Notes:  (Nursing/MHT/Case Management/Adjunct)  Date:  08/01/2018  Time:  12:38 AM  Type of Therapy:  Group Therapy  Participation Level:  Active  Participation Quality:  Appropriate  Affect:  Appropriate  Cognitive:  Appropriate  Insight:  Appropriate  Engagement in Group:  Engaged  Modes of Intervention:  Discussion  Summary of Progress/Problems: Jody Howard stated she worked on her goal. Jody Howard stated she informed the doctor she wanted to go home. Jody Howard stated she does not have a discharge date, but was going to continue working on getting home. Barnie Mort 08/01/2018, 12:38 AM

## 2018-08-01 NOTE — [facility] Group Notes (Signed)
[place] Group Notes:  (Nursing/MHT/Case Management/Adjunct)  Date:  08/01/2018  Time:  9:31 PM  Type of Therapy:  Group Therapy  Participation Level:  Active  Participation Quality:  Appropriate  Affect:  Appropriate  Cognitive:  Appropriate  Insight:  Good  Engagement in Group:  Engaged  Modes of Intervention:  Support  Summary of Progress/Problems:  Jody Howard 08/01/2018, 9:31 PM

## 2018-08-01 NOTE — Plan of Care (Signed)
Emotionally patient  voice of no concerns . No injuries  this shift . Staff encourage a balance  sleep, rest activity  period . Working on Radiographer, therapeutic . Able to verbalize  feelings but psychotic . Unable to understand information  given at this time     Problem: Education: Goal: Emotional status will improve Outcome: Progressing   Problem: Safety: Goal: Periods of time without injury will increase Outcome: Progressing   Problem: Activity: Goal: Will verbalize the importance of balancing activity with adequate rest periods Outcome: Progressing   Problem: Coping: Goal: Coping ability will improve Outcome: Progressing Goal: Will verbalize feelings Outcome: Progressing

## 2018-08-01 NOTE — Progress Notes (Signed)
D: Appropriate  ADL's and personal chores. Interacting  With  Peers and staff Appetite good at meals . Patient voice of being light headed . Refused  Am medication. Writer informed  MD Loura Back of patient decision.  Remains to have Altered Thought  this am  Voice of not knowing  Why she is here. " I just went to pay my taxes and  They said   I was walking funny " " I even paid them complement"     A: Delusional  And paranoid  behavior observed . Emotionally patient  voice of no concerns . No injuries  this shift . Staff encourage a balance  sleep, rest activity  period . Working on Radiographer, therapeutic . Able to verbalize  feelings but psychotic . Unable to understand information  given at this time .    R: Staff will continue to monitor behavior

## 2018-08-01 NOTE — Plan of Care (Signed)
Observed playing cards with 3 others in the day room, clam, soft spoken, calm, polite, getting along with others; hesitant to take medications at the bedtime, persuaded and took medications as ordered, "my kids know that I don't like to take medications .."  Patient slept for Estimated Hours of 7.15; Precautionary checks every 15 minutes for safety maintained, room free of safety hazards, patient sustains no injury or falls during this shift.  Problem: Education: Goal: Emotional status will improve Outcome: Progressing Goal: Mental status will improve Outcome: Progressing Goal: Verbalization of understanding the information provided will improve Outcome: Progressing   Problem: Safety: Goal: Periods of time without injury will increase Outcome: Progressing

## 2018-08-01 NOTE — [facility] Group Notes (Signed)
08/01/2018 1PM  Type of Therapy/Topic:  Group Therapy:  Feelings about Diagnosis  Participation Level:  Active   Description of Group:   This group will allow patients to explore their thoughts and feelings about diagnoses they have received. Patients will be guided to explore their level of understanding and acceptance of these diagnoses. Facilitator will encourage patients to process their thoughts and feelings about the reactions of others to their diagnosis and will guide patients in identifying ways to discuss their diagnosis with significant others in their lives. This group will be process-oriented, with patients participating in exploration of their own experiences, giving and receiving support, and processing challenge from other group members.   Therapeutic Goals: 1. Patient will demonstrate understanding of diagnosis as evidenced by identifying two or more symptoms of the disorder 2. Patient will be able to express two feelings regarding the diagnosis 3. Patient will demonstrate their ability to communicate their needs through discussion and/or role play  Summary of Patient Progress: Actively and appropriately engaged in the group. Patient was able to provide support and validation to other group members.Patient practiced active listening when interacting with the facilitator and other group members. Patient reports that she is unsure of how she ended up here from paying her taxes,. She says that this was very unexpected. She talked about helping people with many problems when she was working. She mentioned that she wants to "tell my family where I am going and what I am doing". Patient is still in the process of obtaining treatment goals.        Therapeutic Modalities:   Cognitive Behavioral Therapy Brief Therapy Feelings Identification    Darin Engels, Marlinda Mike 08/01/2018 2:36 PM

## 2018-08-01 NOTE — Progress Notes (Signed)
Recreation Therapy Notes  Date: 08/01/2018  Time: 9:30 am  Location: Craft Room  Behavioral response: Appropriate   Intervention Topic: Communication  Discussion/Intervention:  Group content today was focused on communication. The group defined communication and ways to communicate with others. Individuals stated reason why communication is important and some reasons to communicate with others. Patients expressed if they thought they were good at communicating with others and ways they could improve their communication skills. The group identified important parts of communication and some experiences they have had in the past with communication. The group participated in the intervention "What is that?", where they had a chance to test out their communication skills and identify ways to improve their communication techniques.  .  Clinical Observations/Feedback:  Patient came to group late due to unknown reasons. Individual was social with peers and staff while participating in group. Makayle Krahn LRT/CTRS         Daveda Larock 08/01/2018 12:03 PM

## 2018-08-02 ENCOUNTER — Other Ambulatory Visit: Payer: Self-pay

## 2018-08-02 DIAGNOSIS — I471 Supraventricular tachycardia: Secondary | ICD-10-CM

## 2018-08-02 DIAGNOSIS — F29 Unspecified psychosis not due to a substance or known physiological condition: Secondary | ICD-10-CM

## 2018-08-02 MED ORDER — POTASSIUM CHLORIDE CRYS ER 20 MEQ PO TBCR
40.0000 meq | EXTENDED_RELEASE_TABLET | Freq: Once | ORAL | Status: AC
  Administered 2018-08-02: 40 meq via ORAL
  Filled 2018-08-02: qty 2

## 2018-08-02 MED ORDER — PROPRANOLOL HCL 20 MG PO TABS
20.0000 mg | ORAL_TABLET | Freq: Three times a day (TID) | ORAL | Status: DC | PRN
  Administered 2018-08-03: 20 mg via ORAL
  Filled 2018-08-02: qty 1

## 2018-08-02 NOTE — Plan of Care (Signed)
No injuries  this shift . Staff encourage a balance  sleep, rest activity  period . Working on Radiographer, therapeutic . Able to verbalize  feelings but psychotic . Unable to understand information  given at this time. Repeating  how horrible her husband died .   Emotionally patient  voice of no concerns   Problem: Education: Goal: Knowledge of [facility] Education information/materials will improve Outcome: Progressing Goal: Emotional status will improve Outcome: Progressing Goal: Mental status will improve Outcome: Progressing Goal: Verbalization of understanding the information provided will improve Outcome: Progressing   Problem: Safety: Goal: Periods of time without injury will increase Outcome: Progressing   Problem: Activity: Goal: Will verbalize the importance of balancing activity with adequate rest periods Outcome: Progressing   Problem: Coping: Goal: Coping ability will improve Outcome: Progressing Goal: Will verbalize feelings Outcome: Progressing

## 2018-08-02 NOTE — Progress Notes (Signed)
Patient ID: Jody Howard, female   DOB: [birth year], 77 y.o.   MRN: [id number] PER STATE REGULATIONS 482.30  THIS CHART WAS REVIEWED FOR MEDICAL NECESSITY WITH RESPECT TO THE PATIENT'S ADMISSION/ DURATION OF STAY.  NEXT REVIEW DATE: 08/06/2018  Chauncy Lean, RN, BSN CASE MANAGER

## 2018-08-02 NOTE — Consult Note (Signed)
Cardiology Consultation:   Patient ID: Jody Howard; [id number]; 09-Jun-1941   Admit date: 07/29/2018 Date of Consult: 08/02/2018  Primary Care Provider: Einar Pheasant, MD Primary Cardiologist: Tamala Julian   Patient Profile:   Jody Howard is a 77 y.o. female with a hx of atrial tachycardia s/p remote ablation, paranoid delusions, and anemia who is being seen today for the evaluation of atrial tachycardia at the request of Dr. Wonda Olds.  History of Present Illness:   Jody Howard has been followed by Dr. Tamala Julian for paroxysmal atrial tachycardia s/p remote ablation > 15 years prior with the details being unclear. Remote echo from 2011 showed EF 55%. She has previously felt like her palpitations were in the setting of increased caffeine use. She was seen in the [facility] ED in 12/2017 for syncope in the setting of diarrhea with volume loss. Work up essentially unrevealing. Seen again in the ED in 05/2018 for syncope felt to be in the setting of vasovagal episode with associated delusions. Work up was unrevealing. She was seen in the ED on 8/23 with paranoia. She has been admitted to the behavioral health unit at [facility]. This morning she was noted to be tachycardic with vitals with a heart rate into the 150s bpm. She was asymptomatic. Cardiology has been asked to evaluate. 12-lead EKG was obtained that demonstrates atrial tachycardia. She indicates she can tell she was tachycardic, by feeling her pulse. She did not have any palpitations, dizziness, presyncope, syncope, SOB, or chest pain. She reports the reason she was tachycardic was because of the medications she is on as well as she had just finished eating. She is currently without complaints. Of note, she indicates her heart rate is tachycardic currently by her self obtained radial pulse. Upon cardiology evaluation at the same time she was noted to have a pulse in the 80s bpm.   Past Medical History:  Diagnosis Date  . Anemia   . Atrial  tachycardia (HCC)    s/p ablation  . Clostridium difficile infection 1997   required hospitalization  . Family history of breast cancer   . Hyperlipidemia   . Mitral valve prolapse   . Paroxysmal supraventricular tachycardia ([place])   . Rheumatic fever     Past Surgical History:  Procedure Laterality Date  . ABLATION     for atrial tachycardia  . BREAST BIOPSY  1997  . TONSILLECTOMY  1944     Home Meds: Prior to Admission medications   Medication Sig Start Date End Date Taking? Authorizing Provider  CALCIUM PO Take 1 tablet by mouth daily.     [provider]  Cholecalciferol (VITAMIN D-3 PO) Take 1 capsule by mouth daily. Taking 2000 IU daily    [provider]  CRESTOR 5 MG tablet TAKE 1/2 TABLET(2.5 MG) BY MOUTH DAILY 10/21/16   Einar Pheasant, MD  meloxicam (MOBIC) 15 MG tablet Take 1 tablet by mouth daily.    [provider]  Multiple Vitamins-Minerals (MULTIVITAMIN PO) Take 1 tablet by mouth daily.     [provider]    Inpatient Medications: Scheduled Meds: . divalproex  250 mg Oral Q12H  . paliperidone  6 mg Oral Daily   Continuous Infusions:  PRN Meds: acetaminophen, alum & mag hydroxide-simeth, hydrOXYzine, magnesium hydroxide, traZODone  Allergies:   Allergies  Allergen Reactions  . Codeine Other (See Comments)    Unknown.  . Lipitor [Atorvastatin] Other (See Comments)    unknown  . Simvastatin Other (See Comments)  unknown  . Other Nausea And Vomiting    Has had c-diff in past and cipro causes severe vomiting  . Silicone Nausea And Vomiting  . Flagyl [Metronidazole] Other (See Comments)    unknown    Social History:   Social History   Socioeconomic History  . Marital status: Widowed    Spouse name: Not on file  . Number of children: 2  . Years of education: Not on file  . Highest education level: Not on file  Occupational History  . Not on file  Social Needs  . Financial resource strain: Not on file    . Food insecurity:    Worry: Not on file    Inability: Not on file  . Transportation needs:    Medical: Not on file    Non-medical: Not on file  Tobacco Use  . Smoking status: Never Smoker  . Smokeless tobacco: Never Used  Substance and Sexual Activity  . Alcohol use: Yes    Alcohol/week: 0.0 standard drinks    Comment: occasional  . Drug use: No  . Sexual activity: Not Currently  Lifestyle  . Physical activity:    Days per week: Not on file    Minutes per session: Not on file  . Stress: Not on file  Relationships  . Social connections:    Talks on phone: Not on file    Gets together: Not on file    Attends religious service: Not on file    Active member of club or organization: Not on file    Attends meetings of clubs or organizations: Not on file    Relationship status: Not on file  . Intimate partner violence:    Fear of current or ex partner: Not on file    Emotionally abused: Not on file    Physically abused: Not on file    Forced sexual activity: Not on file  Other Topics Concern  . Not on file  Social History Narrative  . Not on file     Family History:  Family History  Problem Relation Age of Onset  . Breast cancer Mother 45  . Cancer Paternal Aunt        colon cancer  . Multiple myeloma Father        dx in his 67s  . Breast cancer Sister 51       d. 55  . Heart attack Sister        due to iodine reaction  . Breast cancer Sister 11       d. 75  . Breast cancer Cousin        dx twice, first time possibly under 55; mat first cousin  . Breast cancer Maternal Aunt        dx in her 11s    ROS:  Review of Systems  Unable to perform ROS: Psychiatric disorder      Physical Exam/Data:   Vitals:   08/01/18 0615 08/01/18 0616 08/02/18 0610 08/02/18 0611  BP: 99/74 (!) 97/56 108/69 104/67  Pulse: (!) 148 (!) 101 (!) 140 (!) 152  Resp: 16  16   Temp: 97.8 F (36.6 C)  97.9 F (36.6 C)   TempSrc: Oral  Oral   SpO2: 100% 100% 99% 99%  Weight:       Height:        Intake/Output Summary (Last 24 hours) at 08/02/2018 1502 Last data filed at 08/02/2018 1241 Gross per 24 hour  Intake 1080 ml  Output -  Net 1080 ml   Filed Weights   07/29/18 1754  Weight: 48.8 kg   Body mass index is 18.35 kg/m.   Physical Exam: General: Well developed, well nourished, in no acute distress. Head: Normocephalic, atraumatic, sclera non-icteric, no xanthomas, nares without discharge.  Neck: Negative for carotid bruits. JVD not elevated. Lungs: Clear bilaterally to auscultation without wheezes, rales, or rhonchi. Breathing is unlabored. Heart: RRR with S1 S2. No murmurs, rubs, or gallops appreciated. Abdomen: Soft, non-tender, non-distended with normoactive bowel sounds. No hepatomegaly. No rebound/guarding. No obvious abdominal masses. Msk:  Strength and tone appear normal for age. Extremities: No clubbing or cyanosis. No edema. Distal pedal pulses are 2+ and equal bilaterally. Neuro: Alert. No facial asymmetry. No focal deficit. Moves all extremities spontaneously. Psych:  Responds to questions appropriately with a flat affect.   EKG:  The EKG was personally reviewed and demonstrates: 8/25 - NSR, 64 bpm, no acute st/t changes. 8/28 - atrial tachycardia, 132 bpm, diffuse retrograde P waves, nonspecific st/t changes   Telemetry:  Telemetry was personally reviewed and demonstrates: not on telemetry  Weights: Filed Weights   07/29/18 1754  Weight: 48.8 kg    Relevant CV Studies: 2-D echo 2011: EF 55%, small IVC filling defect consistent with thrombus, LA normal, RVSF normal, RVSP normal.   Laboratory Data:  Chemistry Recent Labs  Lab 07/28/18 1548  NA 140  K 3.6  CL 104  CO2 27  GLUCOSE 114*  BUN 18  CREATININE 0.97  CALCIUM 9.8  GFRNONAA 55*  GFRAA >60  ANIONGAP 9    Recent Labs  Lab 07/28/18 1548  PROT 7.3  ALBUMIN 4.5  AST 29  ALT 14  ALKPHOS 68  BILITOT 0.7   Hematology Recent Labs  Lab 07/28/18 1548  WBC 6.3   RBC 4.32  HGB 13.8  HCT 40.6  MCV 94.0  MCH 31.9  MCHC 34.0  RDW 13.0  PLT 370   Cardiac EnzymesNo results for input(s): TROPONINI in the last 168 hours. No results for input(s): TROPIPOC in the last 168 hours.  BNPNo results for input(s): BNP, PROBNP in the last 168 hours.  DDimer No results for input(s): DDIMER in the last 168 hours.  Radiology/Studies:  Mr Jeri Cos [facility] Contrast  Result Date: 07/29/2018 IMPRESSION: No acute or reversible finding. Mild age related volume loss. Minimal chronic small vessel change of the cerebral hemispheric white matter, less than often seen in persons of this age. Electronically Signed   By: Nelson Chimes M.D.   On: 07/29/2018 15:25    Assessment and Plan:   1. Atrial tachycardia: -Relatively asymptomatic  -Heart rates currently in the 80s bpm -Does not want to continue with metoprolol -Can short-acting propranolol 20 mg prn tachy-palpitations for heart rate > 110 bpm with hold parameters for SBP < 100 mmHg -   For questions or updates, please contact [place] Please consult [email or web address] for contact info under Cardiology/STEMI.   Signed, Christell Faith, PA-C [place] Pager: [phone] 08/02/2018, 3:02 PM

## 2018-08-02 NOTE — Progress Notes (Signed)
Patient was visible in the milieu until bed time. Stayed in the dayroom with staff and peers. Attended bedtime group and was supportive to peers. Continues to experience disturbed thought process: paranoid and delusional but denying need for treatment. "I really don't need to be here...".Pensive with  delayed responses to questions, patient continues to question why she is here. Reported that she is not supposed to take Risperidone 2mg  " that is too much". Was encouraged to discuss her medication regime with MD then she stated "that doctor is not trustable". Patient otherwise took the scheduled medications. Urine sample sent to lab as ordered. Patient slept all night  And woke up in the morning for vital signs and hygiene. Staff continue to provide support and encouragements. Safety precautions maintained.

## 2018-08-02 NOTE — Progress Notes (Signed)
[facility] MD Progress Note  08/02/2018 12:57 PM Jody Howard  MRN:  [id number] Subjective:  Pt appears slightly improved today in thought process and content. She is socializing well with peers and has brighter affect and emotions.She was able to have a much more appropriate conversation today and more organized. She did not bring up any delusional thought content today. She was able to talk about her 5 grandchildren and did not mention anything about one of them being molested. She was still wondering why she is in the hospital but did not mention anything paranoid in nature. She also talked well about her husband and how he suffered from frontal lobe syndrome. She states that she wishes there was a support group in [place] so people could be educated on this. She states that she "feels great." She states that she made 4 friends on the unit and they plan to stay in contact when she leaves. She states that she had "a great visit" with her children yesterday. She continues to completely agree with going with her son to the bank to remove the gun. She states, "I want nothing to do with the gun at all. "  I spoke with her son, Jeannie Fend. He states that her mood seems improved and is having more emotions that were very appropriate. HE states that in the past she was usually very dismissive and angry but now more variety of emotions. She still was paranoid last night when he did see her but seems to have noticed a slight improvement. We discussed that she is having tachycardia. He states that she has had this in the past and had an ablation but he thought that he had heard it wasn't fully successful but he is isn't positive about that. She has had episodes of tachycardia off an on in the past and also had a syncopal episode fairly recently that he feels may have been from that. She did take metoprolol in the past but then she refused to continue this.   Principal Problem: Delusional disorder [facility]) Diagnosis:    Patient Active Problem List   Diagnosis Date Noted  . Psychosis ([place]) [F29] 07/28/2018  . Mild dementia [F03.90] 07/28/2018  . Delusional disorder ([place]) [F22] 07/28/2018  . Family history of breast cancer [Z80.3] 10/24/2016  . Right hip pain [M25.551] 07/23/2015  . Stress [F43.9] 01/21/2015  . Health care maintenance [Z00.00] 01/21/2015  . Female bladder prolapse [N81.10] 09/24/2014  . Vaginal bleeding [N93.9] 09/24/2014  . Abdominal pain, left lower quadrant [R10.32] 09/24/2014  . Left arm pain [M79.602] 04/07/2014  . Dysuria [R30.0] 06/14/2013  . Vaginal prolapse [N81.10] 06/06/2013  . Pure hypercholesterolemia [E78.00] 02/11/2013  . Bronchiectasis ([place]) [J47.9] 02/11/2013  . Atrial tachycardia ([place]) [I47.1] 01/30/2010   Total Time spent with patient: 30 minutes  Past Psychiatric History: See h&P  Past Medical History:  Past Medical History:  Diagnosis Date  . Anemia   . Atrial tachycardia (HCC)    s/p ablation  . Clostridium difficile infection 1997   required hospitalization  . Family history of breast cancer   . Hyperlipidemia   . Mitral valve prolapse   . Paroxysmal supraventricular tachycardia ([place])   . Rheumatic fever     Past Surgical History:  Procedure Laterality Date  . ABLATION     for atrial tachycardia  . BREAST BIOPSY  1997  . TONSILLECTOMY  1944   Family History:  Family History  Problem Relation Age of Onset  . Breast cancer Mother 68  .  Cancer Paternal Aunt        colon cancer  . Multiple myeloma Father        dx in his 36s  . Breast cancer Sister 66       d. 11  . Heart attack Sister        due to iodine reaction  . Breast cancer Sister 1       d. 36  . Breast cancer Cousin        dx twice, first time possibly under 51; mat first cousin  . Breast cancer Maternal Aunt        dx in her 28s   Family Psychiatric  History: See h&P Social History:  Social History   Substance and Sexual Activity  Alcohol Use Yes  . Alcohol/week: 0.0  standard drinks   Comment: occasional     Social History   Substance and Sexual Activity  Drug Use No    Social History   Socioeconomic History  . Marital status: Widowed    Spouse name: Not on file  . Number of children: 2  . Years of education: Not on file  . Highest education level: Not on file  Occupational History  . Not on file  Social Needs  . Financial resource strain: Not on file  . Food insecurity:    Worry: Not on file    Inability: Not on file  . Transportation needs:    Medical: Not on file    Non-medical: Not on file  Tobacco Use  . Smoking status: Never Smoker  . Smokeless tobacco: Never Used  Substance and Sexual Activity  . Alcohol use: Yes    Alcohol/week: 0.0 standard drinks    Comment: occasional  . Drug use: No  . Sexual activity: Not Currently  Lifestyle  . Physical activity:    Days per week: Not on file    Minutes per session: Not on file  . Stress: Not on file  Relationships  . Social connections:    Talks on phone: Not on file    Gets together: Not on file    Attends religious service: Not on file    Active member of club or organization: Not on file    Attends meetings of clubs or organizations: Not on file    Relationship status: Not on file  Other Topics Concern  . Not on file  Social History Narrative  . Not on file   Additional Social History:                         Sleep: Good  Appetite:  Good  Current Medications: Current Facility-Administered Medications  Medication Dose Route Frequency Provider Last Rate Last Dose  . acetaminophen (TYLENOL) tablet 650 mg  650 mg Oral Q6H PRN Clapacs, John T, MD      . alum & mag hydroxide-simeth (MAALOX/MYLANTA) 200-200-20 MG/5ML suspension 30 mL  30 mL Oral Q4H PRN Clapacs, John T, MD      . divalproex (DEPAKOTE) DR tablet 250 mg  250 mg Oral Q12H Batsheva Stevick, Tyson Babinski, MD   250 mg at 08/02/18 3151  . hydrOXYzine (ATARAX/VISTARIL) tablet 10 mg  10 mg Oral TID PRN Clapacs, John  T, MD      . magnesium hydroxide (MILK OF MAGNESIA) suspension 30 mL  30 mL Oral Daily PRN Clapacs, John T, MD      . paliperidone (INVEGA) 24 hr tablet 6 mg  6 mg  Oral Daily Keron Koffman, Tyson Babinski, MD   6 mg at 08/02/18 0919  . traZODone (DESYREL) tablet 50 mg  50 mg Oral QHS PRN Clapacs, Madie Reno, MD        Lab Results: No results found for this or any previous visit (from the past 48 hour(s)).  Blood Alcohol level:  Lab Results  Component Value Date   ETH <10 44/31/5400    Metabolic Disorder Labs: Lab Results  Component Value Date   HGBA1C 4.9 07/28/2018   MPG 93.93 07/28/2018   No results found for: PROLACTIN Lab Results  Component Value Date   CHOL 206 (H) 07/28/2018   TRIG 68 07/28/2018   HDL 58 07/28/2018   CHOLHDL 3.6 07/28/2018   VLDL 14 07/28/2018   LDLCALC 134 (H) 07/28/2018   LDLCALC 115 (H) 05/10/2018    Physical Findings: AIMS: Facial and Oral Movements Muscles of Facial Expression: None, normal Lips and Perioral Area: None, normal Jaw: None, normal Tongue: None, normal,Extremity Movements Upper (arms, wrists, hands, fingers): None, normal Lower (legs, knees, ankles, toes): None, normal, Trunk Movements Neck, shoulders, hips: None, normal, Overall Severity Severity of abnormal movements (highest score from questions above): None, normal Incapacitation due to abnormal movements: None, normal Patient's awareness of abnormal movements (rate only patient's report): No Awareness, Dental Status Current problems with teeth and/or dentures?: No Does patient usually wear dentures?: No  CIWA:    COWS:     Musculoskeletal: Strength & Muscle Tone: within normal limits Gait & Station: normal Patient leans: N/A  Psychiatric Specialty Exam: Physical Exam  Nursing note and vitals reviewed.   Review of Systems  Cardiovascular: Negative for chest pain.  All other systems reviewed and are negative.   Blood pressure 104/67, pulse (!) 152, temperature 97.9 F (36.6 C),  temperature source Oral, resp. rate 16, height 5' 4.17" (1.63 m), weight 48.8 kg, last menstrual period 01/29/1983, SpO2 99 %.Body mass index is 18.35 kg/m.  General Appearance: Casual  Eye Contact:  Good  Speech:  Clear and Coherent  Volume:  Normal  Mood:  Euthymic  Affect:  Brighter today, polite, smiling  Thought Process:  Coherent and Goal Directed, much more organized today  Orientation:  Full (Time, Place, and Person)  Thought Content:  Illogical at times but this is improving  Suicidal Thoughts:  No  Homicidal Thoughts:  No  Memory:  Immediate;   Fair  Judgement:  Impaired  Insight:  Lacking  Psychomotor Activity:  Normal  Concentration:  Concentration: Fair  Recall:  [employer] of Knowledge:  Fair  Language:  Fair  Akathisia:  No      Assets:  Resilience  ADL's:  Intact  Cognition:  Impaired,  Mild  Sleep:  Number of Hours: 7.15     Treatment Plan Summary: 77 yo female admitted due to severe paranoia and delusions. She was started on Risperdal and Depakote. She is showing some improvements today and is much less perseverative on delusional thought content today. Brighter affect and less irritable and argumentative. She is tachycardic which she has a history of. She is denying chest pain. Cardiology was consulted.   Plan:  Schizoaffective disorder -She was switched to [place] 6 mg starting today. Plan to give LAI prior to discharge -Continue Depakote 250 mg BID  Tachycardia -Repeat EKG -Replete K >4. Will order 40 meq today and repeat BMP tomorrow -Appreciate cardiology recommendations  Dispo -She will return home on discharge. Follow up with Dr. Nicolasa Ducking. Will likely place on  outpatient commitment  Marylin Crosby, MD 08/02/2018, 12:57 PM

## 2018-08-02 NOTE — Progress Notes (Addendum)
D:Patient remains altered thought . Interacting with peers ,. Attending  Unit programing  injuries  this shift . Staff encourage a balance  sleep, rest activity  period . Working on Radiographer, therapeutic . Able to verbalize  feelings but psychotic . Unable to understand information  given at this time. Repeating  how horrible her husband died .   Emotionally patient  voice of no concerns Patient stated slept good last night .Stated appetite is good and energy level  Is normal. Denies suicidal  homicidal ideations  .  No auditory hallucinations  Voice of  No pain concerns . Appropriate ADL'S. Interacting with peers and staff.  A: Encourage patient participation with unit programming . Instruction  Given on  Medication , verbalize understanding. R: Voice no other concerns. Staff continue to monitor

## 2018-08-02 NOTE — Progress Notes (Signed)
    Consult for tachycardia. Patient with known atach. She is admitted with anxiety and paranoia. No EKG since 07/30/2018. Recommend this be obtained this morning. Order has been placed. Depending on this, we may need telemetry if psychiatry feels this safe and appropriate. Recommend repletion of K+ to > 4.0. We need to see if this is atach, sinus rhythm in the setting of her paranoia vs alternative rhythm. If this is found to be sinus tachycardia, would recommend treatment of her underlying condition. Further recommendations pending objective data for review. Discussed with MD who agrees with the above.

## 2018-08-02 NOTE — Progress Notes (Signed)
Recreation Therapy Notes  Date: 08/02/2018  Time: 9:30 am  Location: Craft Room  Behavioral response: Appropriate   Intervention Topic: Emotions  Discussion/Intervention:  Group content on today was focused on emotions. The group identified what emotions are and why it is important to have emotions. Patients expressed some positive and negative emotions. Individuals gave some past experiences on how they normally dealt with emotions in the past. The group described some positive ways to deal with emotions in the future. Patients participated in the intervention "The Situation" where individuals were given a chance to respond to certain situations involving their emotions.  Clinical Observations/Feedback:  Patient attended group and stated stability helps deal with emotions. She stated it is healthy to manage and deal with emotions. Individual was social with peers and staff while participating in the intervention.  Kery Haltiwanger LRT/CTRS         Faduma Cho 08/02/2018 11:39 AM

## 2018-08-02 NOTE — Plan of Care (Signed)
Stayed in the milieu, compliant with unit programs

## 2018-08-03 LAB — BASIC METABOLIC PANEL
Anion gap: 6 (ref 5–15)
BUN: 24 mg/dL — ABNORMAL HIGH (ref 8–23)
CO2: 32 mmol/L (ref 22–32)
Calcium: 9.6 mg/dL (ref 8.9–10.3)
Chloride: 105 mmol/L (ref 98–111)
Creatinine, Ser: 0.77 mg/dL (ref 0.44–1.00)
GFR calc Af Amer: >60 mL/min (ref >60)
GFR calc non Af Amer: >60 mL/min (ref >60)
Glucose, Bld: 91 mg/dL (ref 70–99)
Potassium: 4.8 mmol/L (ref 3.5–5.1)
Sodium: 143 mmol/L (ref 135–145)

## 2018-08-03 MED ORDER — PALIPERIDONE PALMITATE ER 234 MG/1.5ML IM SUSY
234.0000 mg | PREFILLED_SYRINGE | Freq: Once | INTRAMUSCULAR | Status: AC
  Administered 2018-08-04: 234 mg via INTRAMUSCULAR
  Filled 2018-08-03: qty 1.5

## 2018-08-03 MED ORDER — PALIPERIDONE ER 3 MG PO TB24
9.0000 mg | ORAL_TABLET | Freq: Every day | ORAL | Status: DC
  Administered 2018-08-04: 9 mg via ORAL
  Filled 2018-08-03: qty 3

## 2018-08-03 MED ORDER — PALIPERIDONE ER 3 MG PO TB24: 9.0000 mg | ORAL_TABLET | Freq: Every day | ORAL | Status: DC

## 2018-08-03 NOTE — Plan of Care (Signed)
Visible and active in the milieu

## 2018-08-03 NOTE — [facility] Group Notes (Signed)
[place] Group Notes:  (Nursing/MHT/Case Management/Adjunct)  Date:  08/03/2018  Time:  1:16 AM  Type of Therapy:  Group Therapy  Participation Level:  Active  Participation Quality:  Appropriate  Affect:  Appropriate  Cognitive:  Appropriate  Insight:  Appropriate  Engagement in Group:  Engaged  Modes of Intervention:  Discussion  Summary of Progress/Problems: Sarissa stated her goal was to go home. Maryah stated she has not talked with her doctor about leaving. Aretha stated she was ready to go home. MHT reviewed rules and expectations of the unit. MHT processed with the patients about goal setting. MHT encouraged patients to work on goals that would prepare them for discharge. MHT addressed concerns about anxiety. MHT offered steps to address anxiety. MHT informed patients they would have to address the issues that cause anxiety. MHT explained how avoiding stressful things that cause anxiety only increases anxiety by avoiding it. MHT reviewed resources available in the community (providers, medication management, psychiatrist). Barnie Mort 08/03/2018, 1:16 AM

## 2018-08-03 NOTE — Plan of Care (Signed)
Stressful, anxious, disappointed, worried about "her necklace and cell phone... They removed my cell phone when I got here in [place] and they asked me to give up my gold necklace, there is a special stone that was removed from my deceased husband wedding ring that we placed in my necklace so that he could be with me all the time."  Chart reviewed, patient belongings examined; upon arrival to [place] Darlin Priestly, NT documented: "Pt Belongings: Pink Shirt, Blue Short Pants, Tan underwear, black shoes, watch, necklace, and 1 ring, and purse with $8.00 cash, bank cards, lots of papers and keys... EVERYTHING PLACED IN PURSE BY PT.Marland KitchenMarland KitchenNo cell phone was found on pt even though she stated she had one.  RN Berton Lan watch this Theme park manager for other belongings and PT counted out money with this Probation officer as well."  Significant time spent to track this necklace down, the courtesy call was placed to her son, who picked up belongings earlier; son was unable to find necklace and the cell phone.  On arrival to the [facility], necklace was not documented as part of her belongings.  Patient slept for Estimated Hours of 7.30; Precautionary checks every 15 minutes for safety maintained, room free of safety hazards, patient sustains no injury or falls during this shift.  Problem: Safety: Goal: Periods of time without injury will increase Outcome: Progressing   Problem: Coping: Goal: Coping ability will improve Outcome: Progressing Goal: Will verbalize feelings Outcome: Progressing   Problem: Education: Goal: Emotional status will improve Outcome: Not Progressing Goal: Mental status will improve Outcome: Not Progressing

## 2018-08-03 NOTE — [facility] Group Notes (Signed)
08/03/2018 1PM  Type of Therapy/Topic:  Group Therapy:  Balance in Life  Participation Level:  Active  Description of Group:   This group will address the concept of balance and how it feels and looks when one is unbalanced. Patients will be encouraged to process areas in their lives that are out of balance and identify reasons for remaining unbalanced. Facilitators will guide patients in utilizing problem-solving interventions to address and correct the stressor making their life unbalanced. Understanding and applying boundaries will be explored and addressed for obtaining and maintaining a balanced life. Patients will be encouraged to explore ways to assertively make their unbalanced needs known to significant others in their lives, using other group members and facilitator for support and feedback.  Therapeutic Goals: 1. Patient will identify two or more emotions or situations they have that consume much of in their lives. 2. Patient will identify signs/triggers that life has become out of balance:  3. Patient will identify two ways to set boundaries in order to achieve balance in their lives:  4. Patient will demonstrate ability to communicate their needs through discussion and/or role plays  Summary of Patient Progress: Patient attended free expressions group which included recreational play and music therapy. Patient interacted appropriately with other group members. Patient is still in the process of meeting their treatment goals.        Therapeutic Modalities:   Cognitive Behavioral Therapy Solution-Focused Therapy Assertiveness Training  Shardee Dieu, LCSW  

## 2018-08-03 NOTE — Plan of Care (Signed)
Patient alert and oriented to self, place and time. Refused Invega this morning stating , "This is what increased my heart rate last night.I am not taking that." Patient present in the milieu interacting with select peers. Denies any thoughts of self harm/HI and pain. Her goal for today is to follow directions and work on her discharge plans. Milieu remains safe with q 15 minute safety checks.

## 2018-08-03 NOTE — Progress Notes (Addendum)
Patient is more visible and active in the milieu. Currently in the dayroom and interacting with peers appropriately. Upon assessment, patient expressed concern related to medication Lorayne Bender) reporting that she will not take it, that it increases her pulse. Medication education provided but there is no evidence of understanding. Patient is denial and saying that there is no need to take medications. Becomes sad when discussing her mental health condition "see..I am not even supposed to be here....".  Patient is otherwise pleasant in the milieu, currently attending group with peers. Staff continue to monitor and to provide support and encouragements.  0030: Patient sounded to have difficulty breathing while asleep. Was snoring and sounded to be struggering with breathing. Pulse  124. Patient woke up and stated that she was just sleeping, that she does not have any problem.  0050: Vital sign WNL (refer to documentation). Patient complaining about the medication that increased her pulse " I was just fine before I came here, and now all these medications are making me sick". Was encouraged to discuss her medication regime with MD in AM. Safety precautions reinforced.

## 2018-08-03 NOTE — Progress Notes (Signed)
Recreation Therapy Notes  Date: 08/03/2018  Time: 9:30 am  Location: Craft Room  Behavioral response: Appropriate   Intervention Topic: Goals  Discussion/Intervention:  Group content on today was focused on goals. Patients described what goals are and how they define goals. Individuals expressed how they go about setting goals and reaching them. The group identified how important goals are and if they make short term goals to reach long term goals. Patients described how many goals they work on at a time and what affects them not reaching their goal. Individuals described how much time they put into planning and obtaining their goals. The group participated in the intervention "My Goal Board" and made personal goal boards to help them achieve their goal. Clinical Observations/Feedback:  Patient attended group and explained that goals are set to help you move forward.She stated she sets goals by thinking about the things that need to be done. Individual was social with peers and staff while participating in the intervention.  Jody Howard LRT/CTRS         Kawana Hegel 08/03/2018 12:30 PM

## 2018-08-03 NOTE — Progress Notes (Signed)
Jody Howard Progress Note  08/03/2018 10:56 AM Jody Howard  MRN:  [id number] Subjective:  Pt states that she is feeling "great." She appears to be in a good mood and is able to have a normal, rational conversation at the surface. She talks about what she is learning in group. She states that she is excited to spend time with her 5 grandchildren this Winter. She states, "I have met some wonderful people here!"  She is perseverative on discharging and does not understand why we are keeping her here and "blaming me. What did I do wrong?" She does not bring up any paranoid or delusional thought content on her own. However, when asked specifically about certain things it is evident that delusions are still present. However, she does not fixate and focus on them as much as admission. I asked about her feelings about her son in law. She states, "I guess I have to deal with it because I don't want to lose my daughter. I love her so much." She did not go into any greater detail about this. I also asked about the drones that she feels were following her. She states that some people with the [place] the drones in the neighborhood and one was hitting her window. She states taht she was not scared and states that she does not feel they are following her but she is adamant they were present. She states, "That is one of my goals is to get used to some of the technology these days." She continues to have very poor insight into the need for medications and becomes upset that she needs to take them. She does not endorse SI or any thoughts of self harm.   Principal Problem: Delusional disorder [facility]) Diagnosis:   Patient Active Problem List   Diagnosis Date Noted  . Psychosis ([place]) [F29] 07/28/2018  . Mild dementia [F03.90] 07/28/2018  . Delusional disorder ([place]) [F22] 07/28/2018  . Family history of breast cancer [Z80.3] 10/24/2016  . Right hip pain [M25.551] 07/23/2015  . Stress [F43.9]  01/21/2015  . Health care maintenance [Z00.00] 01/21/2015  . Female bladder prolapse [N81.10] 09/24/2014  . Vaginal bleeding [N93.9] 09/24/2014  . Abdominal pain, left lower quadrant [R10.32] 09/24/2014  . Left arm pain [M79.602] 04/07/2014  . Dysuria [R30.0] 06/14/2013  . Vaginal prolapse [N81.10] 06/06/2013  . Pure hypercholesterolemia [E78.00] 02/11/2013  . Bronchiectasis ([place]) [J47.9] 02/11/2013  . Atrial tachycardia ([place]) [I47.1] 01/30/2010   Total Time spent with patient: 30 minutes  Past Psychiatric History: See H&P  Past Medical History:  Past Medical History:  Diagnosis Date  . Anemia   . Atrial tachycardia ([facility])    s/p ablation  . Clostridium difficile infection 1997   required hospitalization  . Family history of breast cancer   . Hyperlipidemia   . Mitral valve prolapse   . Paroxysmal supraventricular tachycardia ([place])   . Rheumatic fever     Past Surgical History:  Procedure Laterality Date  . ABLATION     for atrial tachycardia  . BREAST BIOPSY  1997  . TONSILLECTOMY  1944   Family History:  Family History  Problem Relation Age of Onset  . Breast cancer Mother 43  . Cancer Paternal Aunt        colon cancer  . Multiple myeloma Father        dx in his 42s  . Breast cancer Sister 48       d. 63  . Heart  attack Sister        due to iodine reaction  . Breast cancer Sister 32       d. 109  . Breast cancer Cousin        dx twice, first time possibly under 8; mat first cousin  . Breast cancer Maternal Aunt        dx in her 71s   Family Psychiatric  History: See H&P Social History:  Social History   Substance and Sexual Activity  Alcohol Use Yes  . Alcohol/week: 0.0 standard drinks   Comment: occasional     Social History   Substance and Sexual Activity  Drug Use No    Social History   Socioeconomic History  . Marital status: Widowed    Spouse name: Not on file  . Number of children: 2  . Years of education: Not on file  . Highest  education level: Not on file  Occupational History  . Not on file  Social Needs  . Financial resource strain: Not on file  . Food insecurity:    Worry: Not on file    Inability: Not on file  . Transportation needs:    Medical: Not on file    Non-medical: Not on file  Tobacco Use  . Smoking status: Never Smoker  . Smokeless tobacco: Never Used  Substance and Sexual Activity  . Alcohol use: Yes    Alcohol/week: 0.0 standard drinks    Comment: occasional  . Drug use: No  . Sexual activity: Not Currently  Lifestyle  . Physical activity:    Days per week: Not on file    Minutes per session: Not on file  . Stress: Not on file  Relationships  . Social connections:    Talks on phone: Not on file    Gets together: Not on file    Attends religious service: Not on file    Active member of club or organization: Not on file    Attends meetings of clubs or organizations: Not on file    Relationship status: Not on file  Other Topics Concern  . Not on file  Social History Narrative  . Not on file   Additional Social History:                         Sleep: Good  Appetite:  Good  Current Medications: Current Facility-Administered Medications  Medication Dose Route Frequency Provider Last Rate Last Dose  . acetaminophen (TYLENOL) tablet 650 mg  650 mg Oral Q6H PRN Clapacs, John T, Howard      . alum & mag hydroxide-simeth (MAALOX/MYLANTA) 200-200-20 MG/5ML suspension 30 mL  30 mL Oral Q4H PRN Clapacs, John T, Howard      . divalproex (DEPAKOTE) DR tablet 250 mg  250 mg Oral Q12H McNew, Tyson Babinski, Howard   250 mg at 08/03/18 9295  . hydrOXYzine (ATARAX/VISTARIL) tablet 10 mg  10 mg Oral TID PRN Clapacs, Madie Reno, Howard      . magnesium hydroxide (MILK OF MAGNESIA) suspension 30 mL  30 mL Oral Daily PRN Clapacs, Madie Reno, Howard      . Derrill Memo ON 08/04/2018] paliperidone (INVEGA SUSTENNA) injection 234 mg  234 mg Intramuscular Once McNew, Holly R, Howard      . paliperidone (INVEGA) 24 hr tablet 9 mg  9  mg Oral Daily McNew, Holly R, Howard      . propranolol (INDERAL) tablet 20 mg  20 mg Oral  TID PRN Rise Mu, PA-C   20 mg at 08/03/18 7619  . traZODone (DESYREL) tablet 50 mg  50 mg Oral QHS PRN Clapacs, Madie Reno, Howard        Lab Results:  Results for orders placed or performed during the hospital encounter of 07/29/18 (from the past 48 hour(s))  Basic metabolic panel     Status: Abnormal   Collection Time: 08/03/18  6:28 AM  Result Value Ref Range   Sodium 143 135 - 145 mmol/L   Potassium 4.8 3.5 - 5.1 mmol/L   Chloride 105 98 - 111 mmol/L   CO2 32 22 - 32 mmol/L   Glucose, Bld 91 70 - 99 mg/dL   BUN 24 (H) 8 - 23 mg/dL   Creatinine, Ser 0.77 0.44 - 1.00 mg/dL   Calcium 9.6 8.9 - 10.3 mg/dL   GFR calc non Af Amer >60 >60 mL/min   GFR calc Af Amer >60 >60 mL/min    Comment: (NOTE) The eGFR has been calculated using the CKD EPI equation. This calculation has not been validated in all clinical situations. eGFR's persistently <60 mL/min signify possible Chronic Kidney Disease.    Anion gap 6 5 - 15    Comment: Performed at [facility], [address] [place], [place] [address]    Blood Alcohol level:  Lab Results  Component Value Date   [facility] <10 67/11/4579    Metabolic Disorder Labs: Lab Results  Component Value Date   HGBA1C 4.9 07/28/2018   MPG 93.93 07/28/2018   No results found for: PROLACTIN Lab Results  Component Value Date   CHOL 206 (H) 07/28/2018   TRIG 68 07/28/2018   HDL 58 07/28/2018   CHOLHDL 3.6 07/28/2018   VLDL 14 07/28/2018   LDLCALC 134 (H) 07/28/2018   LDLCALC 115 (H) 05/10/2018    Physical Findings: AIMS: Facial and Oral Movements Muscles of Facial Expression: None, normal Lips and Perioral Area: None, normal Jaw: None, normal Tongue: None, normal,Extremity Movements Upper (arms, wrists, hands, fingers): None, normal Lower (legs, knees, ankles, toes): None, normal, Trunk Movements Neck, shoulders, hips: None, normal, Overall  Severity Severity of abnormal movements (highest score from questions above): None, normal Incapacitation due to abnormal movements: None, normal Patient's awareness of abnormal movements (rate only patient's report): No Awareness, Dental Status Current problems with teeth and/or dentures?: No Does patient usually wear dentures?: No  CIWA:    COWS:     Musculoskeletal: Strength & Muscle Tone: within normal limits Gait & Station: normal Patient leans: N/A  Psychiatric Specialty Exam: Physical Exam  Nursing note and vitals reviewed.   Review of Systems  All other systems reviewed and are negative.   Blood pressure (!) 138/125, pulse (!) 133, temperature 97.9 F (36.6 C), temperature source Oral, resp. rate 16, height 5' 4.17" (1.63 m), weight 48.8 kg, last menstrual period 01/29/1983, SpO2 100 %.Body mass index is 18.35 kg/m.  General Appearance: Casual  Eye Contact:  Good  Speech:  Clear and Coherent  Volume:  Normal  Mood:  Euthymic  Affect:  Congruent  Thought Process:  Coherent and Goal Directed, less disorganized  Orientation:  Full (Time, Place, and Person)  Thought Content:  Delusions and Paranoid Ideation  Suicidal Thoughts:  No  Homicidal Thoughts:  No  Memory:  Immediate;   Fair  Judgement:  Impaired  Insight:  Lacking  Psychomotor Activity:  Normal  Concentration:  Concentration: Fair  Recall:  [employer] of Knowledge:  Fair  Language:  Fair  Akathisia:  No      Assets:  Resilience  ADL's:  Intact  Cognition:  Impaired,  Mild  Sleep:  Number of Hours: 7.3     Treatment Plan Summary: 77 yo female admitted due to severe paranoia and delusions. Delusions are still present but she is less fixated and focused on them. She does not mention them unless she is specifically asked about them. Even then, she does not focus on them as much. She has brighter affect and is more organized in thought process. She continues to have extremely poor insight and does not feel  she needs medications. Cardiology did evaluate patient and recommends prn beta blocker for tachycardia. Pt is asymptomatic from tachycardia  Plan:  Schizoaffective disorder -Increase Invega to 9 mg daily -Plan to give initial dose of [place] tomorrow -Continue Depakote 250 mg BID  Tachycardia -Cardiology evaluated and added prn beta blocker  -K today is 4.8 -Appreciate consult from cardiology!  Dispo -She will return home on discharge and follow up with Dr. Smiley Houseman, Howard 08/03/2018, 10:56 AM

## 2018-08-04 MED ORDER — PALIPERIDONE ER 3 MG PO TB24
6.0000 mg | ORAL_TABLET | Freq: Every day | ORAL | Status: AC
  Administered 2018-08-05 – 2018-08-08 (×4): 6 mg via ORAL
  Filled 2018-08-04 (×4): qty 2

## 2018-08-04 NOTE — Progress Notes (Signed)
Received Jody Howard this AM in the dinning room eating her breakfast, she avoided taking her medications this AM, stating  they make her feel ill. She eventually took her medications  after lunch.  She has been OOB in the dinning room most of the day working on a puzzle with her peers. She denied all of the psychiatric symptoms including feeling suicidal. She received her Invega injection in her left deltoid after dinner. Her daughter arrived for a visit this PM.

## 2018-08-04 NOTE — Progress Notes (Addendum)
[facility] MD Progress Note  08/04/2018 12:35 PM Jody Howard  MRN:  [id number] Subjective: Today she states, "I'm am perfect today!"  Pt s a bit more disorganized today than yesterday. She did refuse oral INvega yesterday. She is very polite and pleasant. She is social with peers and was putting a puzzle together this morning with another peer. She states, "This has ben an eye opener. I met so many people." She still is upset about being in the hospital and wants [place] to "interview those 3 women." She states that she is not sure what she did wrong but "I think it was because I was looking at people' license plates." She then thanks me and states, "Everyone blamed me when I first came here." She changes the subject quite a bit today. She then states, "I love my children so much." She adamantly denies SI or any thoughts of suicide. She states, "I'm just the opposite. I hope I live until I"m 89 at least. A lot of my family has had longevity." She denies HI or thoughts of harming others. We did discuss the [place] injection today. She is hesitant but is agreeable to receiving it. She states, "Okay I will take it. What time do you think it will be?" She is still very ambivalent about medications.    Principal Problem: Schizoaffective disorder ([place]) Diagnosis:   Patient Active Problem List   Diagnosis Date Noted  . Schizoaffective disorder ([place]) [F25.9] 07/28/2018    Priority: High  . Mild dementia [F03.90] 07/28/2018  . Delusional disorder ([place]) [F22] 07/28/2018  . Family history of breast cancer [Z80.3] 10/24/2016  . Right hip pain [M25.551] 07/23/2015  . Stress [F43.9] 01/21/2015  . Health care maintenance [Z00.00] 01/21/2015  . Female bladder prolapse [N81.10] 09/24/2014  . Vaginal bleeding [N93.9] 09/24/2014  . Abdominal pain, left lower quadrant [R10.32] 09/24/2014  . Left arm pain [M79.602] 04/07/2014  . Dysuria [R30.0] 06/14/2013  . Vaginal prolapse [N81.10] 06/06/2013  . Pure  hypercholesterolemia [E78.00] 02/11/2013  . Bronchiectasis ([place]) [J47.9] 02/11/2013  . Atrial tachycardia ([place]) [I47.1] 01/30/2010   Total Time spent with patient: 30 minutes  Past Psychiatric History: See H&P  Past Medical History:  Past Medical History:  Diagnosis Date  . Anemia   . Atrial tachycardia (H[facility])    s/p ablation  . Clostridium difficile infection 1997   required hospitalization  . Family history of breast cancer   . Hyperlipidemia   . Mitral valve prolapse   . Paroxysmal supraventricular tachycardia ([place])   . Rheumatic fever     Past Surgical History:  Procedure Laterality Date  . ABLATION     for atrial tachycardia  . BREAST BIOPSY  1997  . TONSILLECTOMY  1944   Family History:  Family History  Problem Relation Age of Onset  . Breast cancer Mother 59  . Cancer Paternal Aunt        colon cancer  . Multiple myeloma Father        dx in his 20s  . Breast cancer Sister 47       d. 2  . Heart attack Sister        due to iodine reaction  . Breast cancer Sister 58       d. 79  . Breast cancer Cousin        dx twice, first time possibly under 62; mat first cousin  . Breast cancer Maternal Aunt        dx in her 68s  Family Psychiatric  History: See H&P Social History:  Social History   Substance and Sexual Activity  Alcohol Use Yes  . Alcohol/week: 0.0 standard drinks   Comment: occasional     Social History   Substance and Sexual Activity  Drug Use No    Social History   Socioeconomic History  . Marital status: Widowed    Spouse name: Not on file  . Number of children: 2  . Years of education: Not on file  . Highest education level: Not on file  Occupational History  . Not on file  Social Needs  . Financial resource strain: Not on file  . Food insecurity:    Worry: Not on file    Inability: Not on file  . Transportation needs:    Medical: Not on file    Non-medical: Not on file  Tobacco Use  . Smoking status: Never Smoker  .  Smokeless tobacco: Never Used  Substance and Sexual Activity  . Alcohol use: Yes    Alcohol/week: 0.0 standard drinks    Comment: occasional  . Drug use: No  . Sexual activity: Not Currently  Lifestyle  . Physical activity:    Days per week: Not on file    Minutes per session: Not on file  . Stress: Not on file  Relationships  . Social connections:    Talks on phone: Not on file    Gets together: Not on file    Attends religious service: Not on file    Active member of club or organization: Not on file    Attends meetings of clubs or organizations: Not on file    Relationship status: Not on file  Other Topics Concern  . Not on file  Social History Narrative  . Not on file   Additional Social History:                         Sleep: Good  Appetite:  Good  Current Medications: Current Facility-Administered Medications  Medication Dose Route Frequency Provider Last Rate Last Dose  . acetaminophen (TYLENOL) tablet 650 mg  650 mg Oral Q6H PRN Clapacs, John T, MD      . alum & mag hydroxide-simeth (MAALOX/MYLANTA) 200-200-20 MG/5ML suspension 30 mL  30 mL Oral Q4H PRN Clapacs, John T, MD      . divalproex (DEPAKOTE) DR tablet 250 mg  250 mg Oral Q12H Kaede Clendenen, Tyson Babinski, MD   250 mg at 08/03/18 1950  . hydrOXYzine (ATARAX/VISTARIL) tablet 10 mg  10 mg Oral TID PRN Clapacs, Madie Reno, MD      . magnesium hydroxide (MILK OF MAGNESIA) suspension 30 mL  30 mL Oral Daily PRN Clapacs, John T, MD      . paliperidone (INVEGA SUSTENNA) injection 234 mg  234 mg Intramuscular Once Kaleen Rochette R, MD      . paliperidone (INVEGA) 24 hr tablet 9 mg  9 mg Oral Daily Marquese Burkland R, MD      . propranolol (INDERAL) tablet 20 mg  20 mg Oral TID PRN Rise Mu, PA-C   20 mg at 08/03/18 7858  . traZODone (DESYREL) tablet 50 mg  50 mg Oral QHS PRN Clapacs, Madie Reno, MD        Lab Results:  Results for orders placed or performed during the hospital encounter of 07/29/18 (from the past 48 hour(s))   Basic metabolic panel     Status: Abnormal   Collection Time:  08/03/18  6:28 AM  Result Value Ref Range   Sodium 143 135 - 145 mmol/L   Potassium 4.8 3.5 - 5.1 mmol/L   Chloride 105 98 - 111 mmol/L   CO2 32 22 - 32 mmol/L   Glucose, Bld 91 70 - 99 mg/dL   BUN 24 (H) 8 - 23 mg/dL   Creatinine, Ser 0.77 0.44 - 1.00 mg/dL   Calcium 9.6 8.9 - 10.3 mg/dL   GFR calc non Af Amer >60 >60 mL/min   GFR calc Af Amer >60 >60 mL/min    Comment: (NOTE) The eGFR has been calculated using the CKD EPI equation. This calculation has not been validated in all clinical situations. eGFR's persistently <60 mL/min signify possible Chronic Kidney Disease.    Anion gap 6 5 - 15    Comment: Performed at [facility], [address] [place], [place] [address]    Blood Alcohol level:  Lab Results  Component Value Date   [facility] <10 91/69/4503    Metabolic Disorder Labs: Lab Results  Component Value Date   HGBA1C 4.9 07/28/2018   MPG 93.93 07/28/2018   No results found for: PROLACTIN Lab Results  Component Value Date   CHOL 206 (H) 07/28/2018   TRIG 68 07/28/2018   HDL 58 07/28/2018   CHOLHDL 3.6 07/28/2018   VLDL 14 07/28/2018   LDLCALC 134 (H) 07/28/2018   LDLCALC 115 (H) 05/10/2018    Physical Findings: AIMS: Facial and Oral Movements Muscles of Facial Expression: None, normal Lips and Perioral Area: None, normal Jaw: None, normal Tongue: None, normal,Extremity Movements Upper (arms, wrists, hands, fingers): None, normal Lower (legs, knees, ankles, toes): None, normal, Trunk Movements Neck, shoulders, hips: None, normal, Overall Severity Severity of abnormal movements (highest score from questions above): None, normal Incapacitation due to abnormal movements: None, normal Patient's awareness of abnormal movements (rate only patient's report): No Awareness, Dental Status Current problems with teeth and/or dentures?: No Does patient usually wear dentures?: No  CIWA:     COWS:     Musculoskeletal: Strength & Muscle Tone: within normal limits Gait & Station: normal Patient leans: N/A  Psychiatric Specialty Exam: Physical Exam  Nursing note and vitals reviewed.   Review of Systems  All other systems reviewed and are negative.   Blood pressure 97/66, pulse (!) 102, temperature 98 F (36.7 C), temperature source Oral, resp. rate 18, height 5' 4.17" (1.63 m), weight 48.8 kg, last menstrual period 01/29/1983, SpO2 98 %.Body mass index is 18.35 kg/m.  General Appearance: Casual  Eye Contact:  Good  Speech:  Clear and Coherent  Volume:  Normal  Mood:  Euthymic  Affect:  Appropriate  Thought Process:  Disorganized  Orientation:  Full (Time, Place, and Person)  Thought Content:  Illogical, delusional  Suicidal Thoughts:  No  Homicidal Thoughts:  No  Memory:  Immediate;   Fair  Judgement:  Impaired  Insight:  Lacking  Psychomotor Activity:  Normal  Concentration:  Concentration: Poor  Recall:  [employer] of Knowledge:  Fair  Language:  Fair  Akathisia:  No      Assets:  Resilience  ADL's:  Intact  Cognition:  WNL  Sleep:  Number of Hours: 7     Treatment Plan Summary: 77 yo female admitted due to delusions and paranoia that has been worsening and impairing her daily functioning. She refused oral Invega yesterday. She is slightly more disorganized today and delusions are more prominent today. She still has very poor  insight into the need for medications and to why she is here. She is very polite and social on the unit. She is agreeable to getting the initial INvega injection today.   Schizoaffective disorder -Will get initial [place] injection of 234 mg today. Will given 2nd initiation dose of 156 mg in 4-7 days.  -Will continue oral INvega at 6 mg daily until 2nd injection -Continue Depakote 250 mg BID. Check level tomorrow  Tachycardia -Pt has history of paroxysmal tachycardia -Cardiology was consulted -Recommended prn  Propranolol  Dispo -She will return home on discharge. I contacted her son today to provide update. He is still pursing guardianship. She will follow up with Dr. Nicolasa Ducking.    Marylin Crosby, MD 08/04/2018, 12:35 PM

## 2018-08-04 NOTE — Progress Notes (Signed)
Recreation Therapy Notes   Date: 08/04/2018  Time: 9:30 pm  Location: Outside  Behavioral response: Appropriate  Group Type: Leisure  Participation level: Active  Communication: Patient was social with peers and staff.  Comments: N/A  Parisha Beaulac LRT/CTRS        Florina Glas 08/04/2018 1:02 PM

## 2018-08-04 NOTE — BHH Group Notes (Signed)
LCSW Group Therapy Note  08/04/2018 1:00 pm  Type of Therapy and Topic:  Group Therapy:  Feelings around Relapse and Recovery  Participation Level:  None   Description of Group:    Patients in this group will discuss emotions they experience before and after a relapse. They will process how experiencing these feelings, or avoidance of experiencing them, relates to having a relapse. Facilitator will guide patients to explore emotions they have related to recovery. Patients will be encouraged to process which emotions are more powerful. They will be guided to discuss the emotional reaction significant others in their lives may have to their relapse or recovery. Patients will be assisted in exploring ways to respond to the emotions of others without this contributing to a relapse.  Therapeutic Goals: 1. Patient will identify two or more emotions that lead to a relapse for them 2. Patient will identify two emotions that result when they relapse 3. Patient will identify two emotions related to recovery 4. Patient will demonstrate ability to communicate their needs through discussion and/or role plays   Summary of Patient Progress:  [place] did not actively engage in any of the group discussion on feelings around relapse and recovery she stated, "My experiences are different from everyone else" which was nonsensical to the topic of the discussion.   Therapeutic Modalities:   Cognitive Behavioral Therapy Solution-Focused Therapy Assertiveness Training Relapse Prevention Therapy   Devona Konig, LCSW 08/04/2018 2:02 PM

## 2018-08-04 NOTE — BHH Group Notes (Signed)
[employer] Notes:  (Nursing/MHT/Case Management/Adjunct)  Date:  08/04/2018  Time:  2:15 AM  Type of Therapy:  Group Therapy  Participation Level:  None  Participation Quality:  Appropriate  Affect:  Flat  Cognitive:  Alert  Insight:  Lacking  Engagement in Group:  Limited  Modes of Intervention:  Support  Summary of Progress/Problems:  Ivar Drape 08/04/2018, 2:15 AM

## 2018-08-04 NOTE — Tx Team (Signed)
Interdisciplinary Treatment and Diagnostic Plan Update  08/04/2018 Time of Session: 11:30am Jody Howard MRN: [id number]  Principal Diagnosis: Schizoaffective disorder [facility])  Secondary Diagnoses: Principal Problem:   Schizoaffective disorder ([place]) Active Problems:   Delusional disorder ([place])   Current Medications:  Current Facility-Administered Medications  Medication Dose Route Frequency Provider Last Rate Last Dose  . acetaminophen (TYLENOL) tablet 650 mg  650 mg Oral Q6H PRN Clapacs, John T, MD      . alum & mag hydroxide-simeth (MAALOX/MYLANTA) 200-200-20 MG/5ML suspension 30 mL  30 mL Oral Q4H PRN Clapacs, John T, MD      . divalproex (DEPAKOTE) DR tablet 250 mg  250 mg Oral Q12H McNew, Tyson Babinski, MD   250 mg at 08/03/18 1950  . hydrOXYzine (ATARAX/VISTARIL) tablet 10 mg  10 mg Oral TID PRN Clapacs, Madie Reno, MD      . magnesium hydroxide (MILK OF MAGNESIA) suspension 30 mL  30 mL Oral Daily PRN Clapacs, John T, MD      . paliperidone (INVEGA SUSTENNA) injection 234 mg  234 mg Intramuscular Once McNew, Holly R, MD      . paliperidone (INVEGA) 24 hr tablet 9 mg  9 mg Oral Daily McNew, Holly R, MD      . propranolol (INDERAL) tablet 20 mg  20 mg Oral TID PRN Rise Mu, PA-C   20 mg at 08/03/18 8527  . traZODone (DESYREL) tablet 50 mg  50 mg Oral QHS PRN Clapacs, Madie Reno, MD       PTA Medications: Medications Prior to Admission  Medication Sig Dispense Refill Last Dose  . CALCIUM PO Take 1 tablet by mouth daily.    Taking  . Cholecalciferol (VITAMIN D-3 PO) Take 1 capsule by mouth daily. Taking 2000 IU daily   Taking  . CRESTOR 5 MG tablet TAKE 1/2 TABLET(2.5 MG) BY MOUTH DAILY 30 tablet 5 Taking  . meloxicam (MOBIC) 15 MG tablet Take 1 tablet by mouth daily.   Taking  . Multiple Vitamins-Minerals (MULTIVITAMIN PO) Take 1 tablet by mouth daily.    Taking    Patient Stressors: Loss of spouse Other: declining cognitively   Patient Strengths: Average or above average  intelligence Communication skills Financial means Physical Health Supportive family/friends  Treatment Modalities: Medication Management, Group therapy, Case management,  1 to 1 session with clinician, Psychoeducation, Recreational therapy.   Physician Treatment Plan for Primary Diagnosis: Schizoaffective disorder ([place]) Long Term Goal(s): Improvement in symptoms so as ready for discharge NA   Short Term Goals: Ability to identify changes in lifestyle to reduce recurrence of condition will improve Ability to verbalize feelings will improve Ability to disclose and discuss suicidal ideas Ability to demonstrate self-control will improve Ability to identify and develop effective coping behaviors will improve Ability to maintain clinical measurements within normal limits will improve Compliance with prescribed medications will improve Ability to identify triggers associated with substance abuse/mental health issues will improve NA  Medication Management: Evaluate patient's response, side effects, and tolerance of medication regimen.  Therapeutic Interventions: 1 to 1 sessions, Unit Group sessions and Medication administration.  Evaluation of Outcomes: Progressing  Physician Treatment Plan for Secondary Diagnosis: Principal Problem:   Schizoaffective disorder ([place]) Active Problems:   Delusional disorder ([place])  Long Term Goal(s): Improvement in symptoms so as ready for discharge NA   Short Term Goals: Ability to identify changes in lifestyle to reduce recurrence of condition will improve Ability to verbalize feelings will improve Ability to disclose and discuss  suicidal ideas Ability to demonstrate self-control will improve Ability to identify and develop effective coping behaviors will improve Ability to maintain clinical measurements within normal limits will improve Compliance with prescribed medications will improve Ability to identify triggers associated with substance  abuse/mental health issues will improve NA     Medication Management: Evaluate patient's response, side effects, and tolerance of medication regimen.  Therapeutic Interventions: 1 to 1 sessions, Unit Group sessions and Medication administration.  Evaluation of Outcomes: Progressing   RN Treatment Plan for Primary Diagnosis: Schizoaffective disorder ([place]) Long Term Goal(s): Knowledge of disease and therapeutic regimen to maintain health will improve  Short Term Goals: Ability to participate in decision making will improve, Ability to identify and develop effective coping behaviors will improve and Compliance with prescribed medications will improve  Medication Management: RN will administer medications as ordered by provider, will assess and evaluate patient's response and provide education to patient for prescribed medication. RN will report any adverse and/or side effects to prescribing provider.  Therapeutic Interventions: 1 on 1 counseling sessions, Psychoeducation, Medication administration, Evaluate responses to treatment, Monitor vital signs and CBGs as ordered, Perform/monitor CIWA, COWS, AIMS and Fall Risk screenings as ordered, Perform wound care treatments as ordered.  Evaluation of Outcomes: Progressing   LCSW Treatment Plan for Primary Diagnosis: Schizoaffective disorder ([place]) Long Term Goal(s): Safe transition to appropriate next level of care at discharge, Engage patient in therapeutic group addressing interpersonal concerns.  Short Term Goals: Engage patient in aftercare planning with referrals and resources, Increase social support, Facilitate patient progression through stages of change regarding substance use diagnoses and concerns, Identify triggers associated with mental health/substance abuse issues and Increase skills for wellness and recovery  Therapeutic Interventions: Assess for all discharge needs, 1 to 1 time with Social worker, Explore available resources and  support systems, Assess for adequacy in community support network, Educate family and significant other(s) on suicide prevention, Complete Psychosocial Assessment, Interpersonal group therapy.  Evaluation of Outcomes: Progressing   Progress in Treatment: Attending groups: Yes. Participating in groups: Yes. Taking medication as prescribed: Yes. Toleration medication: Yes. Family/Significant other contact made: Yes, individual(s) contacted:  Patients Son Patient understands diagnosis: No. Discussing patient identified problems/goals with staff: Yes. Medical problems stabilized or resolved: Yes. Denies suicidal/homicidal ideation: Yes. Issues/concerns per patient self-inventory: No. Other:   New problem(s) identified: No, Describe:  None  New Short Term/Long Term Goal(s): "To go home."  Patient Goals:  "To go home."  Discharge Plan or Barriers: To return home and follow up with outpatient services.  Reason for Continuation of Hospitalization: Delusions  Medication stabilization  Estimated Length of Stay: 5-7 days   Recreational Therapy: Patient Stressors: N/A Patient Goal: Patient will focus on task/topic with 2 prompts from staff within 5 recreation therapy group sessions  Attendees: Patient:  08/04/2018 11:41 AM  Physician: Dr. Amador Cunas, MD 08/04/2018 11:41 AM  Nursing:  08/04/2018 11:41 AM  RN Care Manager: 08/04/2018 11:41 AM  Social Worker: Gary Fleet 08/04/2018 11:41 AM  Recreational Therapist: Isaias Sakai. Marcello Fennel, LRT 08/04/2018 11:41 AM  Other: Derrek Gu, LCSW 08/04/2018 11:41 AM  Other:  08/04/2018 11:41 AM  Other: 08/04/2018 11:41 AM    Scribe for Treatment Team: Darin Engels, LCSW 08/04/2018 11:41 AM

## 2018-08-04 NOTE — Progress Notes (Signed)
Patient ID: Jody Howard, female   DOB: 02/28/1941, 77 y.o.   MRN: [id number]  Jody Howard suffers incapacitating paranoid delusions leading to irresponsible behavior. She has no insight into mental illness and takes medications only with outmost encouragement. Treatment team strongly believes that th patient will benefit from long lasting injectable antipsychotics. The patient reluctantly agrees to [place] sustenna injection.

## 2018-08-05 DIAGNOSIS — F251 Schizoaffective disorder, depressive type: Secondary | ICD-10-CM

## 2018-08-05 LAB — VALPROIC ACID LEVEL: VALPROIC ACID LVL: 82 ug/mL (ref 50.0–100.0)

## 2018-08-05 NOTE — BHH Group Notes (Signed)
LCSW Group Therapy Note  08/05/2018 1:15pm  Type of Therapy and Topic:  Group Therapy:  Cognitive Distortions  Participation Level:  Active   Description of Group:    Patients in this group will be introduced to the topic of cognitive distortions.  Patients will identify and describe cognitive distortions, describe the feelings these distortions create for them.  Patients will identify one or more situations in their personal life where they have cognitively distorted thinking and will verbalize challenging this cognitive distortion through positive thinking skills.  Patients will practice the skill of using positive affirmations to challenge cognitive distortions using affirmation cards.    Therapeutic Goals:  1. Patient will identify two or more cognitive distortions they have used 2. Patient will identify one or more emotions that stem from use of a cognitive distortion 3. Patient will demonstrate use of a positive affirmation to counter a cognitive distortion through discussion and/or role play. 4. Patient will describe one way cognitive distortions can be detrimental to wellness   Summary of Patient Progress:The patient reported that he feels "just fine". Patients were introduced to the topic of cognitive distortions. The patient was able to identify and describe cognitive distortions and the feelings these distortions create for him. The patient indicated that his most used unhelpful thinking styles are disqualifying the positive and using critical words like "should". Patient identified a situation in his personal life where he has cognitively distorted thinking and verbalized and challenged this cognitive distortion through positive thinking skills. Patient was able to provide support and validation to other group members.     Therapeutic Modalities:   Cognitive Behavioral Therapy Motivational Interviewing   Larrissa Stivers  CUEBAS-COLON, LCSW 08/05/2018 12:49 PM

## 2018-08-05 NOTE — Plan of Care (Signed)
Patient is sociable and responding well to treatment regimen took her medicines even though she explain ' I'm not sick I don't know why you are giving me all this medicines' encourage patient to comply with treatment as much as possible, patient is calm , participate in groups and active in groups, voice no concerns, contract for safety of self and others and denies any SI/HI and AVH. Sleep long hours without any interruptions, 15 minute safety rounds is in progress.no distress.   Problem: Education: Goal: Knowledge of  General Education information/materials will improve Outcome: Progressing Goal: Emotional status will improve Outcome: Progressing Goal: Mental status will improve Outcome: Progressing Goal: Verbalization of understanding the information provided will improve Outcome: Progressing   Problem: Safety: Goal: Periods of time without injury will increase Outcome: Progressing   Problem: Activity: Goal: Will verbalize the importance of balancing activity with adequate rest periods Outcome: Progressing   Problem: Coping: Goal: Coping ability will improve Outcome: Progressing Goal: Will verbalize feelings Outcome: Progressing

## 2018-08-05 NOTE — Progress Notes (Signed)
Received Jody Howard this morning in the dinning room eating breakfast. She avoided her medications this morning, therefore her meds  were  taken to her during lunch. She took the medication very slowly and reluctantly. She remained OOB in the milieu at intervals throughout the day.

## 2018-08-05 NOTE — Progress Notes (Signed)
[facility] MD Progress Note  08/05/2018 1:18 PM Jody Howard  MRN:  [id number] Subjective:  She complained about the medicine (Invega 79m) "knocked me out". She said "I never had to go to bed after breakfast" then after the medicine, "next thing I know, it is lunch time!"  However, [facility] her Invega tab at 11:50 am.  She also complained about getting the [place] and doesn't understand why she has to have it.  And she doesn't understand why she has to have the injection and the pill, despite she is explained a couple of times.   Principal Problem: Schizoaffective disorder (HWhite Diagnosis:   Patient Active Problem List   Diagnosis Date Noted  . Sch[place]oaffective disorder ([place] [F25.9] 07/28/2018  . Mild dementia [F03.90] 07/28/2018  . Delusional disorder ([place] [F22] 07/28/2018  . Family history of breast cancer [Z80.3] 10/24/2016  . Right hip pain [M25.551] 07/23/2015  . Stress [F43.9] 01/21/2015  . Health care maintenance [Z00.00] 01/21/2015  . Female bladder prolapse [N81.10] 09/24/2014  . Vaginal bleeding [N93.9] 09/24/2014  . Abdominal pain, left lower quadrant [R10.32] 09/24/2014  . Left arm pain [M79.602] 04/07/2014  . Dysuria [R30.0] 06/14/2013  . Vaginal prolapse [N81.10] 06/06/2013  . Pure hypercholesterolemia [E78.00] 02/11/2013  . Bronchiectasis ([place] [J47.9] 02/11/2013  . Atrial tachycardia ([place] [I47.1] 01/30/2010   Total Time spent with patient: 20 minutes  Past Psychiatric History: See H&P  Past Medical History:  Past Medical History:  Diagnosis Date  . Anemia   . Atrial tachycardia (HCC)    s/p ablation  . Clostridium difficile infection 1997   required hospitalization  . Family history of breast cancer   . Hyperlipidemia   . Mitral valve prolapse   . Paroxysmal supraventricular tachycardia ([place]    . Rheumatic fever     Past Surgical History:  Procedure Laterality Date  . ABLATION     for atrial tachycardia  . BREAST BIOPSY  1997  .  TONSILLECTOMY  1944   Family History:  Family History  Problem Relation Age of Onset  . Breast cancer Mother 827 . Cancer Paternal Aunt        colon cancer  . Multiple myeloma Father        dx in his 666s . Breast cancer Sister 741      d. 737 . Heart attack Sister        due to iodine reaction  . Breast cancer Sister 779      d. 787 . Breast cancer Cousin        dx twice, first time possibly under 547 mat first cousin  . Breast cancer Maternal Aunt        dx in her 618s  Family Psychiatric  History: See H&P Social History:  Social History   Substance and Sexual Activity  Alcohol Use Yes  . Alcohol/week: 0.0 standard drinks   Comment: occasional     Social History   Substance and Sexual Activity  Drug Use No    Social History   Socioeconomic History  . Marital status: Widowed    Spouse name: Not on file  . Number of children: 2  . Years of education: Not on file  . Highest education level: Not on file  Occupational History  . Not on file  Social Needs  . Financial resource strain: Not on file  . Food insecurity:    Worry: Not on file    Inability:  Not on file  . Transportation needs:    Medical: Not on file    Non-medical: Not on file  Tobacco Use  . Smoking status: Never Smoker  . Smokeless tobacco: Never Used  Substance and Sexual Activity  . Alcohol use: Yes    Alcohol/week: 0.0 standard drinks    Comment: occasional  . Drug use: No  . Sexual activity: Not Currently  Lifestyle  . Physical activity:    Days per week: Not on file    Minutes per session: Not on file  . Stress: Not on file  Relationships  . Social connections:    Talks on phone: Not on file    Gets together: Not on file    Attends religious service: Not on file    Active member of club or organization: Not on file    Attends meetings of clubs or organizations: Not on file    Relationship status: Not on file  Other Topics Concern  . Not on file  Social History Narrative  .  Not on file   Additional Social History:   Sleep: Good  Appetite:  Good  Current Medications: Current Facility-Administered Medications  Medication Dose Route Frequency Provider Last Rate Last Dose  . acetaminophen (TYLENOL) tablet 650 mg  650 mg Oral Q6H PRN Clapacs, John T, MD      . alum & mag hydroxide-simeth (MAALOX/MYLANTA) 200-200-20 MG/5ML suspension 30 mL  30 mL Oral Q4H PRN Clapacs, John T, MD      . divalproex (DEPAKOTE) DR tablet 250 mg  250 mg Oral Q12H McNew, Tyson Babinski, MD   250 mg at 08/05/18 1151  . hydrOXYzine (ATARAX/VISTARIL) tablet 10 mg  10 mg Oral TID PRN Clapacs, John T, MD      . magnesium hydroxide (MILK OF MAGNESIA) suspension 30 mL  30 mL Oral Daily PRN Clapacs, John T, MD      . paliperidone (INVEGA) 24 hr tablet 6 mg  6 mg Oral Daily McNew, Tyson Babinski, MD   6 mg at 08/05/18 1150  . propranolol (INDERAL) tablet 20 mg  20 mg Oral TID PRN Rise Mu, PA-C   20 mg at 08/03/18 0998  . traZODone (DESYREL) tablet 50 mg  50 mg Oral QHS PRN Clapacs, Madie Reno, MD        Lab Results:  Results for orders placed or performed during the hospital encounter of 07/29/18 (from the past 48 hour(s))  Valproic acid level     Status: None   Collection Time: 08/05/18  7:29 AM  Result Value Ref Range   Valproic Acid Lvl 82 50.0 - 100.0 ug/mL    Comment: Performed at [facility], [address] [place], [place] [address]    Blood Alcohol level:  Lab Results  Component Value Date   [facility] <10 05/39/7673    Metabolic Disorder Labs: Lab Results  Component Value Date   HGBA1C 4.9 07/28/2018   MPG 93.93 07/28/2018   No results found for: PROLACTIN Lab Results  Component Value Date   CHOL 206 (H) 07/28/2018   TRIG 68 07/28/2018   HDL 58 07/28/2018   CHOLHDL 3.6 07/28/2018   VLDL 14 07/28/2018   LDLCALC 134 (H) 07/28/2018   LDLCALC 115 (H) 05/10/2018    Physical Findings: AIMS: Facial and Oral Movements Muscles of Facial Expression: None, normal Lips and  Perioral Area: None, normal Jaw: None, normal Tongue: None, normal,Extremity Movements Upper (arms, wrists, hands, fingers): None, normal Lower (legs, knees,  ankles, toes): None, normal, Trunk Movements Neck, shoulders, hips: None, normal, Overall Severity Severity of abnormal movements (highest score from questions above): None, normal Incapacitation due to abnormal movements: None, normal Patient's awareness of abnormal movements (rate only patient's report): No Awareness, Dental Status Current problems with teeth and/or dentures?: No Does patient usually wear dentures?: No  CIWA:    COWS:     Musculoskeletal: Strength & Muscle Tone: within normal limits Gait & Station: normal Patient leans: N/A  Psychiatric Specialty Exam: Physical Exam  Nursing note and vitals reviewed.   Review of Systems  All other systems reviewed and are negative.   Blood pressure (!) 91/53, pulse 64, temperature (!) 97.5 F (36.4 C), temperature source Oral, resp. rate 16, height 5' 4.17" (1.63 m), weight 48.8 kg, last menstrual period 01/29/1983, SpO2 100 %.Body mass index is 18.35 kg/m.  General Appearance: Casual  Eye Contact:  Good  Speech:  Clear and Coherent  Volume:  Normal  Mood:  Dysphoric  Affect:  Appropriate and Congruent  Thought Process:  Linear  Orientation:  Full (Time, Place, and Person)  Thought Content:  Illogical, delusional  Suicidal Thoughts:  No  Homicidal Thoughts:  No  Memory:  Immediate;   Fair  Judgement:  Poor  Insight:  Lacking  Psychomotor Activity:  Normal  Concentration:  Concentration: Poor  Recall:  [employer] of Knowledge:  Fair  Language:  Fair  Akathisia:  No      Assets:  Resilience  ADL's:  Intact  Cognition:  WNL  Sleep:  Number of Hours: 6     Treatment Plan Summary: 76 yo female admitted due to delusions and paranoia that has been worsening and impairing her daily functioning. She refused oral Invega yesterday. She is slightly more  disorganized today and delusions are more prominent today. She still has very poor insight into the need for medications and to why she is here. She is very polite and social on the unit. She is agreeable to getting the initial INvega injection today.   Schizoaffective disorder -Kirt Boys injection of 234 mg was given on 08/04/18.  Plan to give 2nd initiation dose of 156 mg in 4-7 days.  -Continue oral INvega 6 mg daily until 2nd injection -Continue Depakote 250 mg BID. VPA is 82 today.   Tachycardia -Pt has history of paroxysmal tachycardia -Cardiology was consulted -Recommended prn Propranolol  Dispo -She will return home on discharge. I contacted her son today to provide update. Shian Goodnow is still pursing guardianship. She will follow up with Dr. Nicolasa Ducking.    Alberto Pina, MD 08/05/2018, 1:18 PM

## 2018-08-06 MED ORDER — DIVALPROEX SODIUM 250 MG PO DR TAB
250.0000 mg | DELAYED_RELEASE_TABLET | Freq: Two times a day (BID) | ORAL | Status: AC
  Administered 2018-08-06: 250 mg via ORAL
  Filled 2018-08-06: qty 1

## 2018-08-06 MED ORDER — DIVALPROEX SODIUM ER 500 MG PO TB24
500.0000 mg | ORAL_TABLET | Freq: Every day | ORAL | Status: DC
  Administered 2018-08-07 – 2018-08-09 (×3): 500 mg via ORAL
  Filled 2018-08-06 (×3): qty 1

## 2018-08-06 NOTE — BHH Group Notes (Signed)
LCSW Group Therapy Note 08/06/2018 1:15pm  Type of Therapy and Topic: Group Therapy: Feelings Around Returning Home & Establishing a Supportive Framework and Supporting Oneself When Supports Not Available  Participation Level: Active  Description of Group:  Patients first processed thoughts and feelings about upcoming discharge. These included fears of upcoming changes, lack of change, new living environments, judgements and expectations from others and overall stigma of mental health issues. The group then discussed the definition of a supportive framework, what that looks and feels like, and how do to discern it from an unhealthy non-supportive network. The group identified different types of supports as well as what to do when your family/friends are less than helpful or unavailable  Therapeutic Goals  1. Patient will identify one healthy supportive network that they can use at discharge. 2. Patient will identify one factor of a supportive framework and how to tell it from an unhealthy network. 3. Patient able to identify one coping skill to use when they do not have positive supports from others. 4. Patient will demonstrate ability to communicate their needs through discussion and/or role plays.  Summary of Patient Progress:  The patient reports she feels "cold." Pt engaged during group session. As patients processed their anxiety about discharge and described healthy supports patient shared "I am ready to be discharge, my family is coming to the picture." Patients identified at least one self-care tool they were willing to use after discharge.   Therapeutic Modalities Cognitive Behavioral Therapy Motivational Interviewing   [place], LCSW 08/06/2018 2:39 PM

## 2018-08-06 NOTE — Progress Notes (Signed)
Received Jody Howard this AM after breakfast, she was compliant with her medications although she did not want to take them. She stated still feeling sick from the medications she took yesterday. She was looking forward to having a family meeting today and was hoping to go home. She has been OOB most of the day in the milieu and socializing with her peers.

## 2018-08-06 NOTE — Progress Notes (Signed)
[facility] MD Progress Note  08/06/2018 2:44 PM Jody Howard  MRN:  [id number] Subjective:  She did take her AM meds, and had family meeting with her son, daughter and Dr. Nicolasa Ducking who will be her outpatient psychiatrist.  She seems to be able to hold herself together at the meeting and agreed with terms that are set up for her followup care.   She was seen after the meeting. She said "I signed the paper!" she said that she did not really want to, but also understands that she doesn't have much of choice.  she continues to have paranoid thoughts, firmly believed that someone is after her, with drone or following her with a car, because "my husband had an affair, and they don't want to talk about it!"  She also stated "I am willing to sacrifice myself for my grandchildren", but did not want to elaborate, as if she knows that she should not talk about it. But later she said again "I am going to let him get away with murder, well not really murder". Again, she doesn't want to reveal the person.  However, she firmly believes her delusions are all real, as she is right "100%".  She repeatedly stated that "I will take a lie detector test if you want me too".   She also believes that people don't believe her because she is a "single old female!"   While she believes that her children love her and want the best for her, she also stated "I love them more!" and feels that they talk behind her back, which led her to this hospitalization.   Furthermore, she is also quite grandiose, listed a lot of her achievement in life.    Spoke with Daughter Cecille Rubin, who reported the delusions have been developing over the past 12 years, and has gotten significantly worse over the past 4-5 years. Cecille Rubin said that she and her brother have gotten multiple calls from the police, the [place], or [employer] as pt goes around to report her delusions as "crime". It has caused a lot distress for the patient and family.  Pt also is paranoid  about all electronic, and she, the pt would put a tape on every electronic devices in her house, including a digital clock.  The whole family went on a vacation trip to [place] recently, pt did great overall, but she continues to believe there were drones in [place] following her, and took a lot pictures of stars, but claiming they drones.  Pt also believes someone from the [place] followed them to [place] to take over their hotel room.   However, overall she had a great time with her grandkids.    Family plan to apply for guardianship in order to enforce medication compliance in the future.  Family also plans to move pt to a retirement community so that she doesn't live alone anymore.  Dr. Nicolasa Ducking and the treatment team are on board with the plan.   Principal Problem: Schizoaffective disorder ([place]) Diagnosis:   Patient Active Problem List   Diagnosis Date Noted  . Schizoaffective disorder ([place]) [F25.9] 07/28/2018  . Mild dementia [F03.90] 07/28/2018  . Delusional disorder ([place]) [F22] 07/28/2018  . Family history of breast cancer [Z80.3] 10/24/2016  . Right hip pain [M25.551] 07/23/2015  . Stress [F43.9] 01/21/2015  . Health care maintenance [Z00.00] 01/21/2015  . Female bladder prolapse [N81.10] 09/24/2014  . Vaginal bleeding [N93.9] 09/24/2014  . Abdominal pain, left lower quadrant [R10.32] 09/24/2014  . Left  arm pain [M79.602] 04/07/2014  . Dysuria [R30.0] 06/14/2013  . Vaginal prolapse [N81.10] 06/06/2013  . Pure hypercholesterolemia [E78.00] 02/11/2013  . Bronchiectasis ([place]) [J47.9] 02/11/2013  . Atrial tachycardia ([place]) [I47.1] 01/30/2010   Total Time spent with patient: 45 minutes  Past Psychiatric History: See H&P  Past Medical History:  Past Medical History:  Diagnosis Date  . Anemia   . Atrial tachycardia (HCC)    s/p ablation  . Clostridium difficile infection 1997   required hospitalization  . Family history of breast cancer   . Hyperlipidemia   . Mitral valve prolapse   .  Paroxysmal supraventricular tachycardia ([place])   . Rheumatic fever     Past Surgical History:  Procedure Laterality Date  . ABLATION     for atrial tachycardia  . BREAST BIOPSY  1997  . TONSILLECTOMY  1944   Family History:  Family History  Problem Relation Age of Onset  . Breast cancer Mother 77  . Cancer Paternal Aunt        colon cancer  . Multiple myeloma Father        dx in his 38s  . Breast cancer Sister 45       d. 27  . Heart attack Sister        due to iodine reaction  . Breast cancer Sister 10       d. 66  . Breast cancer Cousin        dx twice, first time possibly under 52; mat first cousin  . Breast cancer Maternal Aunt        dx in her 84s   Family Psychiatric  History: See H&P Social History:  Social History   Substance and Sexual Activity  Alcohol Use Yes  . Alcohol/week: 0.0 standard drinks   Comment: occasional     Social History   Substance and Sexual Activity  Drug Use No    Social History   Socioeconomic History  . Marital status: Widowed    Spouse name: Not on file  . Number of children: 2  . Years of education: Not on file  . Highest education level: Not on file  Occupational History  . Not on file  Social Needs  . Financial resource strain: Not on file  . Food insecurity:    Worry: Not on file    Inability: Not on file  . Transportation needs:    Medical: Not on file    Non-medical: Not on file  Tobacco Use  . Smoking status: Never Smoker  . Smokeless tobacco: Never Used  Substance and Sexual Activity  . Alcohol use: Yes    Alcohol/week: 0.0 standard drinks    Comment: occasional  . Drug use: No  . Sexual activity: Not Currently  Lifestyle  . Physical activity:    Days per week: Not on file    Minutes per session: Not on file  . Stress: Not on file  Relationships  . Social connections:    Talks on phone: Not on file    Gets together: Not on file    Attends religious service: Not on file    Active member of club or  organization: Not on file    Attends meetings of clubs or organizations: Not on file    Relationship status: Not on file  Other Topics Concern  . Not on file  Social History Narrative  . Not on file   Additional Social History:   Sleep: Good  Appetite:  Good  Current Medications: Current Facility-Administered Medications  Medication Dose Route Frequency Provider Last Rate Last Dose  . acetaminophen (TYLENOL) tablet 650 mg  650 mg Oral Q6H PRN Clapacs, John T, MD      . alum & mag hydroxide-simeth (MAALOX/MYLANTA) 200-200-20 MG/5ML suspension 30 mL  30 mL Oral Q4H PRN Clapacs, John T, MD      . divalproex (DEPAKOTE) DR tablet 250 mg  250 mg Oral Q12H McNew, Holly R, MD   250 mg at 08/06/18 4650  . hydrOXYzine (ATARAX/VISTARIL) tablet 10 mg  10 mg Oral TID PRN Clapacs, John T, MD      . magnesium hydroxide (MILK OF MAGNESIA) suspension 30 mL  30 mL Oral Daily PRN Clapacs, John T, MD      . paliperidone (INVEGA) 24 hr tablet 6 mg  6 mg Oral Daily McNew, Tyson Babinski, MD   6 mg at 08/06/18 0842  . propranolol (INDERAL) tablet 20 mg  20 mg Oral TID PRN Rise Mu, PA-C   20 mg at 08/03/18 3546  . traZODone (DESYREL) tablet 50 mg  50 mg Oral QHS PRN Clapacs, Madie Reno, MD   50 mg at 08/05/18 2136    Lab Results:  Results for orders placed or performed during the hospital encounter of 07/29/18 (from the past 48 hour(s))  Valproic acid level     Status: None   Collection Time: 08/05/18  7:29 AM  Result Value Ref Range   Valproic Acid Lvl 82 50.0 - 100.0 ug/mL    Comment: Performed at [facility], [address] [place], [place] [address]    Blood Alcohol level:  Lab Results  Component Value Date   [facility] <10 75/17/0017    Metabolic Disorder Labs: Lab Results  Component Value Date   HGBA1C 4.9 07/28/2018   MPG 93.93 07/28/2018   No results found for: PROLACTIN Lab Results  Component Value Date   CHOL 206 (H) 07/28/2018   TRIG 68 07/28/2018   HDL 58 07/28/2018   CHOLHDL  3.6 07/28/2018   VLDL 14 07/28/2018   LDLCALC 134 (H) 07/28/2018   LDLCALC 115 (H) 05/10/2018    Physical Findings: AIMS: Facial and Oral Movements Muscles of Facial Expression: None, normal Lips and Perioral Area: None, normal Jaw: None, normal Tongue: None, normal,Extremity Movements Upper (arms, wrists, hands, fingers): None, normal Lower (legs, knees, ankles, toes): None, normal, Trunk Movements Neck, shoulders, hips: None, normal, Overall Severity Severity of abnormal movements (highest score from questions above): None, normal Incapacitation due to abnormal movements: None, normal Patient's awareness of abnormal movements (rate only patient's report): No Awareness, Dental Status Current problems with teeth and/or dentures?: No Does patient usually wear dentures?: No  CIWA:    COWS:     Musculoskeletal: Strength & Muscle Tone: within normal limits Gait & Station: normal Patient leans: N/A  Psychiatric Specialty Exam: Physical Exam  Nursing note and vitals reviewed.   Review of Systems  All other systems reviewed and are negative.   Blood pressure (!) 82/51, pulse 93, temperature 97.8 F (36.6 C), temperature source Oral, resp. rate 16, height 5' 4.17" (1.63 m), weight 48.8 kg, last menstrual period 01/29/1983, SpO2 99 %.Body mass index is 18.35 kg/m.  General Appearance: Casual  Eye Contact:  Good  Speech:  Clear and Coherent  Volume:  Normal  Mood:  Anxious and Dysphoric  Affect:  Appropriate and Congruent  Thought Process:  Linear  Orientation:  Full (Time, Place, and Person)  Thought Content:  Illogical, delusional  Suicidal Thoughts:  No  Homicidal Thoughts:  No  Memory:  Immediate;   Fair  Judgement:  Poor  Insight:  Lacking  Psychomotor Activity:  Normal  Concentration:  Concentration: Poor  Recall:  [employer] of Knowledge:  Fair  Language:  Fair  Akathisia:  No      Assets:  Resilience  ADL's:  Intact  Cognition:  WNL  Sleep:  Number of  Hours: 6     Treatment Plan Summary: 77 yo female admitted due to delusions and paranoia that has been worsening and impairing her daily functioning. She refused oral Invega yesterday. She is slightly more disorganized today and delusions are more prominent today. She still has very poor insight into the need for medications and to why she is here. She is very polite and social on the unit. She agreed to getting the initial INvega injection last Friday.    Schizoaffective disorder -Kirt Boys injection of 234 mg was given on 08/04/18.  Plan to give 2nd initiation dose of 156 mg in 4-7 days.  -Continue oral INvega 6 mg daily until 2nd injection -Continue Depakote 250 mg BID. VPA is 82.  Will combine it to Depakote ER 567m qhs from tomorrow night, per family request to increase compliance.   Tachycardia -Pt has history of paroxysmal tachycardia -Cardiology was consulted -Recommended prn Propranolol  Dispo -She will return home or a retirement community on discharge. Family meeting was held on the unit with son, daughter and Dr. KNicolasa Duckingtoday. .Marland KitchenHe is still pursing guardianship. She will follow up with Dr. KNicolasa Ducking    Marce Schartz, MD 08/06/2018, 2:44 PM

## 2018-08-06 NOTE — Progress Notes (Signed)
Family Meeting:  Family meeting held with Dr Nicolasa Ducking, patient's son, Jody Howard and patient's daughter, Jody Howard.  Patient agreed to taking Kirt Boys monthly and to outpatient followup with Dr Nicolasa Ducking. Patient also agreed to children taking guardianship and her eventually transitioning to the [place].  The patient was not endorsing delusions to the same degree that she has in the past. Paranoid thoughts were not as prevalent and she was more organized in her thinking. She does appear to be tolerating the Invega injection without side effects so far.  She will see Dr Cherlyn Cushing He today for medication management followup.

## 2018-08-06 NOTE — Progress Notes (Signed)
Patient ID: Jody Howard, female   DOB: 1941/01/23, 77 y.o.   MRN: [id number] PER STATE REGULATIONS 482.30  THIS CHART WAS REVIEWED FOR MEDICAL NECESSITY WITH RESPECT TO THE PATIENT'S ADMISSION/DURATION OF STAY.  NEXT REVIEW DATE: 08/10/18  Roma Schanz, RN, BSN CASE MANAGER

## 2018-08-06 NOTE — Plan of Care (Signed)
Patient is engaging in leisure activities with peers and compliant with her medication, and has improved in his mental and emotional well being. Patient is safe in the unit , sleep long hours , contract for safety of self and others . 15 minute rounding is in progress, denies SI/HI/  Problem: Education: Goal: Knowledge of [facility] Education information/materials will improve Outcome: Progressing Goal: Emotional status will improve Outcome: Progressing Goal: Mental status will improve Outcome: Progressing Goal: Verbalization of understanding the information provided will improve Outcome: Progressing   Problem: Safety: Goal: Periods of time without injury will increase Outcome: Progressing   Problem: Activity: Goal: Will verbalize the importance of balancing activity with adequate rest periods Outcome: Progressing   Problem: Coping: Goal: Coping ability will improve Outcome: Progressing Goal: Will verbalize feelings Outcome: Progressing   Problem: Education: Goal: Knowledge of General Education information will improve Description Including pain rating scale, medication(s)/side effects and non-pharmacologic comfort measures Outcome: Progressing   Problem: Health Behavior/Discharge Planning: Goal: Ability to manage health-related needs will improve Outcome: Progressing   Problem: Clinical Measurements: Goal: Ability to maintain clinical measurements within normal limits will improve Outcome: Progressing Goal: Will remain free from infection Outcome: Progressing Goal: Diagnostic test results will improve Outcome: Progressing Goal: Respiratory complications will improve Outcome: Progressing Goal: Cardiovascular complication will be avoided Outcome: Progressing   Problem: Activity: Goal: Risk for activity intolerance will decrease Outcome: Progressing   Problem: Nutrition: Goal: Adequate nutrition will be maintained Outcome: Progressing   Problem:  Coping: Goal: Level of anxiety will decrease Outcome: Progressing   Problem: Elimination: Goal: Will not experience complications related to bowel motility Outcome: Progressing Goal: Will not experience complications related to urinary retention Outcome: Progressing   Problem: Pain Managment: Goal: General experience of comfort will improve Outcome: Progressing   Problem: Safety: Goal: Ability to remain free from injury will improve Outcome: Progressing   Problem: Skin Integrity: Goal: Risk for impaired skin integrity will decrease Outcome: Progressing  AVH, no distress.

## 2018-08-07 MED ORDER — PALIPERIDONE PALMITATE ER 156 MG/ML IM SUSY
156.0000 mg | PREFILLED_SYRINGE | Freq: Once | INTRAMUSCULAR | Status: AC
  Administered 2018-08-09: 156 mg via INTRAMUSCULAR
  Filled 2018-08-07 (×2): qty 1

## 2018-08-07 NOTE — Plan of Care (Signed)
Patient is visible and social this shift. Patient is pleasant and polite during assessment. Denies SI/HI/AVH and pain. Reports eating and voiding adequately. Took medications this morning with out prompting, complaint with meals. Milieu remains safe with q 15 minute safety checks.

## 2018-08-07 NOTE — Plan of Care (Addendum)
Patient found in day room upon my arrival. Patient is visible and social this evening. Patient is pleasant and polite during assessment. Denies all complaints including SI/HI/AVH. Reports eating and voiding adequately. Denies pain. Denies need for Trazodone for sleep. "I sleep like a rock." Compliant with HS medication and staff direction. Q 15 minute checks maintained. Will continue to monitor throughout the shift. Patient slept 7.25 hours. No apparent distress. Patient compliant with am vitals. Pulse elevated. Upon manual recheck. WNL. Will endorse care to oncoming shift.  Problem: Education: Goal: Knowledge of [facility] Education information/materials will improve Outcome: Progressing Goal: Emotional status will improve Outcome: Progressing Goal: Mental status will improve Outcome: Progressing Goal: Verbalization of understanding the information provided will improve Outcome: Progressing   Problem: Safety: Goal: Periods of time without injury will increase Outcome: Progressing   Problem: Coping: Goal: Coping ability will improve Outcome: Progressing Goal: Will verbalize feelings Outcome: Progressing   Problem: Education: Goal: Knowledge of General Education information will improve Description Including pain rating scale, medication(s)/side effects and non-pharmacologic comfort measures Outcome: Progressing

## 2018-08-07 NOTE — Progress Notes (Signed)
[facility] MD Progress Note  08/07/2018 2:33 PM Jody Howard  MRN:  [id number] Subjective:  Pt is very calm and appropriate on unit. She is interacting and social with many peers on the unit. She has brighter affect. She is organized in thoughts. She still has some delusional thoughts content but is much less perseverative on this content. She states that the family meeting went well yesterday. She states that her family wants her to move into a retirement home. She has ambivalence about this but is considering it. She states that she is going to think about it. She has been taking her medications although she still does not feel she needs it. She states that it makes her sleepy. She is wiling to continue it, however. She does not endorse SI or HI.   Principal Problem: Schizoaffective disorder ([place]) Diagnosis:   Patient Active Problem List   Diagnosis Date Noted  . Schizoaffective disorder ([place]) [F25.9] 07/28/2018    Priority: High  . Mild dementia [F03.90] 07/28/2018  . Delusional disorder ([place]) [F22] 07/28/2018  . Family history of breast cancer [Z80.3] 10/24/2016  . Right hip pain [M25.551] 07/23/2015  . Stress [F43.9] 01/21/2015  . Health care maintenance [Z00.00] 01/21/2015  . Female bladder prolapse [N81.10] 09/24/2014  . Vaginal bleeding [N93.9] 09/24/2014  . Abdominal pain, left lower quadrant [R10.32] 09/24/2014  . Left arm pain [M79.602] 04/07/2014  . Dysuria [R30.0] 06/14/2013  . Vaginal prolapse [N81.10] 06/06/2013  . Pure hypercholesterolemia [E78.00] 02/11/2013  . Bronchiectasis ([place]) [J47.9] 02/11/2013  . Atrial tachycardia ([place]) [I47.1] 01/30/2010   Total Time spent with patient: 20 minutes  Past Psychiatric History: See H&p  Past Medical History:  Past Medical History:  Diagnosis Date  . Anemia   . Atrial tachycardia (HCC)    s/p ablation  . Clostridium difficile infection 1997   required hospitalization  . Family history of breast cancer   . Hyperlipidemia    . Mitral valve prolapse   . Paroxysmal supraventricular tachycardia ([place])   . Rheumatic fever     Past Surgical History:  Procedure Laterality Date  . ABLATION     for atrial tachycardia  . BREAST BIOPSY  1997  . TONSILLECTOMY  1944   Family History:  Family History  Problem Relation Age of Onset  . Breast cancer Mother 47  . Cancer Paternal Aunt        colon cancer  . Multiple myeloma Father        dx in his 64s  . Breast cancer Sister 44       d. 20  . Heart attack Sister        due to iodine reaction  . Breast cancer Sister 24       d. 39  . Breast cancer Cousin        dx twice, first time possibly under 24; mat first cousin  . Breast cancer Maternal Aunt        dx in her 13s   Family Psychiatric  History: See H&P Social History:  Social History   Substance and Sexual Activity  Alcohol Use Yes  . Alcohol/week: 0.0 standard drinks   Comment: occasional     Social History   Substance and Sexual Activity  Drug Use No    Social History   Socioeconomic History  . Marital status: Widowed    Spouse name: Not on file  . Number of children: 2  . Years of education: Not on file  . Highest  education level: Not on file  Occupational History  . Not on file  Social Needs  . Financial resource strain: Not on file  . Food insecurity:    Worry: Not on file    Inability: Not on file  . Transportation needs:    Medical: Not on file    Non-medical: Not on file  Tobacco Use  . Smoking status: Never Smoker  . Smokeless tobacco: Never Used  Substance and Sexual Activity  . Alcohol use: Yes    Alcohol/week: 0.0 standard drinks    Comment: occasional  . Drug use: No  . Sexual activity: Not Currently  Lifestyle  . Physical activity:    Days per week: Not on file    Minutes per session: Not on file  . Stress: Not on file  Relationships  . Social connections:    Talks on phone: Not on file    Gets together: Not on file    Attends religious service: Not on file     Active member of club or organization: Not on file    Attends meetings of clubs or organizations: Not on file    Relationship status: Not on file  Other Topics Concern  . Not on file  Social History Narrative  . Not on file   Additional Social History:                         Sleep: Good  Appetite:  Good  Current Medications: Current Facility-Administered Medications  Medication Dose Route Frequency Provider Last Rate Last Dose  . acetaminophen (TYLENOL) tablet 650 mg  650 mg Oral Q6H PRN Clapacs, John T, MD      . alum & mag hydroxide-simeth (MAALOX/MYLANTA) 200-200-20 MG/5ML suspension 30 mL  30 mL Oral Q4H PRN Clapacs, John T, MD      . divalproex (DEPAKOTE ER) 24 hr tablet 500 mg  500 mg Oral QHS He, Jun, MD      . hydrOXYzine (ATARAX/VISTARIL) tablet 10 mg  10 mg Oral TID PRN Clapacs, John T, MD      . magnesium hydroxide (MILK OF MAGNESIA) suspension 30 mL  30 mL Oral Daily PRN Clapacs, John T, MD      . Derrill Memo ON 08/09/2018] paliperidone (INVEGA SUSTENNA) injection 156 mg  156 mg Intramuscular Once Khali Perella R, MD      . paliperidone (INVEGA) 24 hr tablet 6 mg  6 mg Oral Daily Shafiq Larch, Tyson Babinski, MD   6 mg at 08/07/18 0831  . propranolol (INDERAL) tablet 20 mg  20 mg Oral TID PRN Rise Mu, PA-C   20 mg at 08/03/18 1638  . traZODone (DESYREL) tablet 50 mg  50 mg Oral QHS PRN Clapacs, Madie Reno, MD   50 mg at 08/05/18 2136    Lab Results: No results found for this or any previous visit (from the past 48 hour(s)).  Blood Alcohol level:  Lab Results  Component Value Date   ETH <10 45/36/4680    Metabolic Disorder Labs: Lab Results  Component Value Date   HGBA1C 4.9 07/28/2018   MPG 93.93 07/28/2018   No results found for: PROLACTIN Lab Results  Component Value Date   CHOL 206 (H) 07/28/2018   TRIG 68 07/28/2018   HDL 58 07/28/2018   CHOLHDL 3.6 07/28/2018   VLDL 14 07/28/2018   LDLCALC 134 (H) 07/28/2018   LDLCALC 115 (H) 05/10/2018    Physical  Findings: AIMS: Facial and  Oral Movements Muscles of Facial Expression: None, normal Lips and Perioral Area: None, normal Jaw: None, normal Tongue: None, normal,Extremity Movements Upper (arms, wrists, hands, fingers): None, normal Lower (legs, knees, ankles, toes): None, normal, Trunk Movements Neck, shoulders, hips: None, normal, Overall Severity Severity of abnormal movements (highest score from questions above): None, normal Incapacitation due to abnormal movements: None, normal Patient's awareness of abnormal movements (rate only patient's report): No Awareness, Dental Status Current problems with teeth and/or dentures?: No Does patient usually wear dentures?: No  CIWA:    COWS:     Musculoskeletal: Strength & Muscle Tone: within normal limits Gait & Station: normal Patient leans: N/A  Psychiatric Specialty Exam: Physical Exam  Nursing note and vitals reviewed.   Review of Systems  All other systems reviewed and are negative.   Blood pressure 97/71, pulse 88, temperature 97.9 F (36.6 C), temperature source Oral, resp. rate 16, height 5' 4.17" (1.63 m), weight 48.8 kg, last menstrual period 01/29/1983, SpO2 100 %.Body mass index is 18.35 kg/m.  General Appearance: Casual  Eye Contact:  Good  Speech:  Clear and Coherent  Volume:  Normal  Mood:  Euthymic  Affect:  Congruent  Thought Process:  Coherent  Orientation:  Full (Time, Place, and Person)  Thought Content:  Delusions  Suicidal Thoughts:  No  Homicidal Thoughts:  No  Memory:  Immediate;   Fair  Judgement:  Impaired  Insight:  Lacking  Psychomotor Activity:  Normal  Concentration:  Concentration: Fair  Recall:  [employer] of Knowledge:  Fair  Language:  Fair  Akathisia:  No      Assets:  Resilience  ADL's:  Intact  Cognition:  WNL  Sleep:  Number of Hours: 7.25     Treatment Plan Summary: 77 yo female admitted due to delusions and paranoia. She is much calmer and brighter in affect. She is much  less perseverative on delusions but they are still present. She has been medication compliant. She had family meeting yesterday which went well.   Schizoaffective disorder -She received Invega Sustenna 234 mg on Friday. Will get 2nd initiation dose on Wednesday of 156 mg -Continue oral INvega until second injection -Continue Depakote 250 mg BID. Level  82  Tachycardia -Normal rate today -Cardiology did see patient-prn Propranolol  Dispo -She will return home on discharge. Her family is very involved and are pursing guardianship. She will follow up with Dr. Smiley Houseman, MD 08/07/2018, 2:33 PM

## 2018-08-08 ENCOUNTER — Telehealth: Payer: Self-pay | Admitting: Genetic Counselor

## 2018-08-08 DIAGNOSIS — F250 Schizoaffective disorder, bipolar type: Secondary | ICD-10-CM

## 2018-08-08 NOTE — Telephone Encounter (Signed)
VM was full on both home and cell.  I could not leave a message.

## 2018-08-08 NOTE — B[facility] Group Notes (Signed)
[facility] Group Notes:  (Nursing/MHT/Case Management/Adjunct)  Date:  08/08/2018  Time:  9:46 PM  Type of Therapy:  Group Therapy  Participation Level:  Active  Participation Quality:  Appropriate  Affect:  Appropriate  Cognitive:  Appropriate  Insight:  Appropriate  Engagement in Group:  Engaged  Modes of Intervention:  Discussion  Summary of Progress/Problems: Shuntae stated her goal is to leave. Izabellah stated she has started working on her discharge plan. Marvelle stated she does not have a discharge date planned at this time.  Barnie Mort 08/08/2018, 9:46 PM

## 2018-08-08 NOTE — BHH Group Notes (Signed)
[facility] Group Notes:  (Nursing/MHT/Case Management/Adjunct)  Date:  08/08/2018  Time:  1:05 AM  Type of Therapy:  Group Therapy  Participation Level:  Active  Participation Quality:  Appropriate  Affect:  Appropriate  Cognitive:  Appropriate  Insight:  Appropriate  Engagement in Group:  Engaged  Modes of Intervention:  Discussion  Summary of Progress/Problems:  Jody Howard 08/08/2018, 1:05 AM
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
## 2018-08-08 NOTE — [facility] Group Notes (Signed)
08/08/2018 1PM  Type of Therapy/Topic:  Group Therapy:  Feelings about Diagnosis  Participation Level:  Minimal   Description of Group:   This group will allow patients to explore their thoughts and feelings about diagnoses they have received. Patients will be guided to explore their level of understanding and acceptance of these diagnoses. Facilitator will encourage patients to process their thoughts and feelings about the reactions of others to their diagnosis and will guide patients in identifying ways to discuss their diagnosis with significant others in their lives. This group will be process-oriented, with patients participating in exploration of their own experiences, giving and receiving support, and processing challenge from other group members.   Therapeutic Goals: 1. Patient will demonstrate understanding of diagnosis as evidenced by identifying two or more symptoms of the disorder 2. Patient will be able to express two feelings regarding the diagnosis 3. Patient will demonstrate their ability to communicate their needs through discussion and/or role play  Summary of Patient Progress: Actively and appropriately engaged in the group. Patient practiced active listening when interacting with the facilitator and other group members. She mentioned having a symptom of  "remianing silent". When asked to go into detail about what she means, she was unable to do so. Patient is still in the process of obtaining treatment goals.        Therapeutic Modalities:   Cognitive Behavioral Therapy Brief Therapy Feelings Identification    Darin Engels, [place] 08/08/2018 2:09 PM

## 2018-08-08 NOTE — Plan of Care (Addendum)
Patient found in day room upon my arrival. Patient is visible and social this evening. Patient mood is described as "fine." Affect is brighter and more animated. Patient spoke about her past living in [place] and early life with deceased husband with fondness. Denies all complaints including SI/HI/AVH. Reports eating and voiding adequately. Observed eating adequate snack. Patient is pleasant and polite throughout the evening. Compliant with HS medication and staff direction. Q 15 minute checks maintained. Will continue to monitor throughout the shift. Patient slept 7 hours. No apparent distress. Compliant with am VS. Pulse slightly elevated upon standing but asymptomatic. Upon recheck, pulse 104. Will endorse care to oncoming shift.  Problem: Education: Goal: Knowledge of [facility] Education information/materials will improve Outcome: Progressing Goal: Emotional status will improve Outcome: Progressing Goal: Mental status will improve Outcome: Progressing Goal: Verbalization of understanding the information provided will improve Outcome: Progressing   Problem: Safety: Goal: Periods of time without injury will increase Outcome: Progressing   Problem: Coping: Goal: Coping ability will improve Outcome: Progressing Goal: Will verbalize feelings Outcome: Progressing   Problem: Education: Goal: Knowledge of General Education information will improve Description Including pain rating scale, medication(s)/side effects and non-pharmacologic comfort measures Outcome: Progressing   Problem: Nutrition: Goal: Adequate nutrition will be maintained Outcome: Progressing   Problem: Coping: Goal: Level of anxiety will decrease Outcome: Progressing

## 2018-08-08 NOTE — Plan of Care (Signed)
Patient pleasant and cooperative in the unit.Appropriate with staff & peers.Attended groups.Compliant with medications.Support and encouragement given.

## 2018-08-08 NOTE — Progress Notes (Signed)
Recreation Therapy Notes  Date: 08/08/2018  Time: 9:30 am   Location: Outside   Behavioral response: N/A   Intervention Topic: Problem Solving  Discussion/Intervention: Patient did not attend group.   Clinical Observations/Feedback:  Patient did not attend group.   Shereen Marton LRT/CTRS        Maddi Collar 08/08/2018 11:08 AM

## 2018-08-09 MED ORDER — DIVALPROEX SODIUM ER 500 MG PO TB24: 500.0000 mg | ORAL_TABLET | Freq: Every day | ORAL | 0 refills | Status: DC

## 2018-08-09 MED ORDER — PALIPERIDONE PALMITATE ER 117 MG/0.75ML IM SUSY: 117.0000 mg | PREFILLED_SYRINGE | INTRAMUSCULAR | 0 refills | Status: DC

## 2018-08-09 NOTE — Progress Notes (Signed)
Recreation Therapy Notes   Date: 08/09/2018  Time: 9:30 am  Location: Craft Room  Behavioral response: Appropriate    Intervention Topic: Team Work  Discussion/Intervention:  Group content on today was focused on teamwork. The group identified what teamwork is. Individuals described who is a part of their team. Patients expressed why they thought teamwork is important. The group stated reasons why they thought it was easier to work with a Dance movement psychotherapist team. Individuals discussed some positives and negatives of working with a team. Patients gave examples of past experiences they had while working with a team. The group participated in the intervention "What is That", where patients were given a chance to point out qualities they look for in a team mate and were able to work in teams with each other.  Clinical Observations/Feedback:  Patient came to group and defined team work as a way to always learn something new. She stated that when working with a team there are always varied ideas. Individual was social with peers and staff during group while participating in the intervention. Gabrianna Fassnacht LRT/CTRS         Trinty Marken 08/09/2018 11:38 AM

## 2018-08-09 NOTE — Plan of Care (Signed)
Patient found in day room upon my arrival. Patient is visible and social this evening. Patient mood and affect are brighter. Patient continues to be concerned with peers health and welfare. "That was my job. You don't just stop caring about people." Denies all complaints including SI/HI/AVH. Reports eating and voiding adequately. Complains only of being tired from the medication. Denies pain. Compliant with HS medication and staff direction. Attends group. Q 15 minute checks maintained. Will continue to monitor throughout the shift. Patient slept 7.5 hours. No apparent distress. Will endorse care to oncoming shift.  Problem: Education: Goal: Knowledge of [facility] Education information/materials will improve Outcome: Progressing Goal: Emotional status will improve Outcome: Progressing Goal: Mental status will improve Outcome: Progressing Goal: Verbalization of understanding the information provided will improve Outcome: Progressing   Problem: Coping: Goal: Coping ability will improve Outcome: Progressing Goal: Will verbalize feelings Outcome: Progressing

## 2018-08-09 NOTE — Progress Notes (Signed)
[facility] MD Progress Note  08/09/2018 3:42 PM Jody Howard  MRN:  [id number] Subjective:  Pt much improved today from yesterday. Much more organized in t thoughts. She is able to have a much more appropriate conversation. She did not bring up any delusional thought content today during interview. She reports feeling "really good." She is sleeping well. Eating well. She is social on the unit. She agrees to getting second injection today.   Principal Problem: Schizoaffective disorder ([place]) Diagnosis:   Patient Active Problem List   Diagnosis Date Noted  . Schizoaffective disorder ([place]) [F25.9] 07/28/2018    Priority: High  . Mild dementia [F03.90] 07/28/2018  . Delusional disorder ([place]) [F22] 07/28/2018  . Family history of breast cancer [Z80.3] 10/24/2016  . Right hip pain [M25.551] 07/23/2015  . Stress [F43.9] 01/21/2015  . Health care maintenance [Z00.00] 01/21/2015  . Female bladder prolapse [N81.10] 09/24/2014  . Vaginal bleeding [N93.9] 09/24/2014  . Abdominal pain, left lower quadrant [R10.32] 09/24/2014  . Left arm pain [M79.602] 04/07/2014  . Dysuria [R30.0] 06/14/2013  . Vaginal prolapse [N81.10] 06/06/2013  . Pure hypercholesterolemia [E78.00] 02/11/2013  . Bronchiectasis ([place]) [J47.9] 02/11/2013  . Atrial tachycardia ([place]) [I47.1] 01/30/2010   Total Time spent with patient: 20 minutes  Past Psychiatric History: See H&P  Past Medical History:  Past Medical History:  Diagnosis Date  . Anemia   . Atrial tachycardia (HCC)    s/p ablation  . Clostridium difficile infection 1997   required hospitalization  . Family history of breast cancer   . Hyperlipidemia   . Mitral valve prolapse   . Paroxysmal supraventricular tachycardia ([place])   . Rheumatic fever     Past Surgical History:  Procedure Laterality Date  . ABLATION     for atrial tachycardia  . BREAST BIOPSY  1997  . TONSILLECTOMY  1944   Family History:  Family History  Problem Relation Age of Onset  .  Breast cancer Mother 26  . Cancer Paternal Aunt        colon cancer  . Multiple myeloma Father        dx in his 41s  . Breast cancer Sister 52       d. 70  . Heart attack Sister        due to iodine reaction  . Breast cancer Sister 10       d. 14  . Breast cancer Cousin        dx twice, first time possibly under 80; mat first cousin  . Breast cancer Maternal Aunt        dx in her 79s   Family Psychiatric  History: See H&P Social History:  Social History   Substance and Sexual Activity  Alcohol Use Yes  . Alcohol/week: 0.0 standard drinks   Comment: occasional     Social History   Substance and Sexual Activity  Drug Use No    Social History   Socioeconomic History  . Marital status: Widowed    Spouse name: Not on file  . Number of children: 2  . Years of education: Not on file  . Highest education level: Not on file  Occupational History  . Not on file  Social Needs  . Financial resource strain: Not on file  . Food insecurity:    Worry: Not on file    Inability: Not on file  . Transportation needs:    Medical: Not on file    Non-medical: Not on file  Tobacco  Use  . Smoking status: Never Smoker  . Smokeless tobacco: Never Used  Substance and Sexual Activity  . Alcohol use: Yes    Alcohol/week: 0.0 standard drinks    Comment: occasional  . Drug use: No  . Sexual activity: Not Currently  Lifestyle  . Physical activity:    Days per week: Not on file    Minutes per session: Not on file  . Stress: Not on file  Relationships  . Social connections:    Talks on phone: Not on file    Gets together: Not on file    Attends religious service: Not on file    Active member of club or organization: Not on file    Attends meetings of clubs or organizations: Not on file    Relationship status: Not on file  Other Topics Concern  . Not on file  Social History Narrative  . Not on file   Additional Social History:                         Sleep:  Good  Appetite:  Good  Current Medications: Current Facility-Administered Medications  Medication Dose Route Frequency Provider Last Rate Last Dose  . acetaminophen (TYLENOL) tablet 650 mg  650 mg Oral Q6H PRN Clapacs, John T, MD      . alum & mag hydroxide-simeth (MAALOX/MYLANTA) 200-200-20 MG/5ML suspension 30 mL  30 mL Oral Q4H PRN Clapacs, Madie Reno, MD      . divalproex (DEPAKOTE ER) 24 hr tablet 500 mg  500 mg Oral QHS He, Jun, MD   500 mg at 08/08/18 2145  . hydrOXYzine (ATARAX/VISTARIL) tablet 10 mg  10 mg Oral TID PRN Clapacs, Madie Reno, MD      . magnesium hydroxide (MILK OF MAGNESIA) suspension 30 mL  30 mL Oral Daily PRN Clapacs, John T, MD      . paliperidone (INVEGA SUSTENNA) injection 156 mg  156 mg Intramuscular Once Nisha Dhami R, MD      . propranolol (INDERAL) tablet 20 mg  20 mg Oral TID PRN Rise Mu, PA-C   20 mg at 08/03/18 2671  . traZODone (DESYREL) tablet 50 mg  50 mg Oral QHS PRN Clapacs, Madie Reno, MD   50 mg at 08/05/18 2136    Lab Results: No results found for this or any previous visit (from the past 48 hour(s)).  Blood Alcohol level:  Lab Results  Component Value Date   ETH <10 24/58/0998    Metabolic Disorder Labs: Lab Results  Component Value Date   HGBA1C 4.9 07/28/2018   MPG 93.93 07/28/2018   No results found for: PROLACTIN Lab Results  Component Value Date   CHOL 206 (H) 07/28/2018   TRIG 68 07/28/2018   HDL 58 07/28/2018   CHOLHDL 3.6 07/28/2018   VLDL 14 07/28/2018   LDLCALC 134 (H) 07/28/2018   LDLCALC 115 (H) 05/10/2018    Physical Findings: AIMS: Facial and Oral Movements Muscles of Facial Expression: None, normal Lips and Perioral Area: None, normal Jaw: None, normal Tongue: None, normal,Extremity Movements Upper (arms, wrists, hands, fingers): None, normal Lower (legs, knees, ankles, toes): None, normal, Trunk Movements Neck, shoulders, hips: None, normal, Overall Severity Severity of abnormal movements (highest score from  questions above): None, normal Incapacitation due to abnormal movements: None, normal Patient's awareness of abnormal movements (rate only patient's report): No Awareness, Dental Status Current problems with teeth and/or dentures?: No Does patient usually wear dentures?:  No  CIWA:    COWS:     Musculoskeletal: Strength & Muscle Tone: within normal limits Gait & Station: normal Patient leans: N/A  Psychiatric Specialty Exam: Physical Exam  Nursing note and vitals reviewed.   Review of Systems  All other systems reviewed and are negative.   Blood pressure (!) 89/56, pulse (!) 109, temperature 98.1 F (36.7 C), temperature source Oral, resp. rate 18, height 5' 4.17" (1.63 m), weight 48.8 kg, last menstrual period 01/29/1983, SpO2 96 %.Body mass index is 18.35 kg/m.  General Appearance: Casual  Eye Contact:  Good  Speech:  Clear and Coherent  Volume:  Normal  Mood:  Euthymic  Affect:  Congruent  Thought Process:  Coherent and Goal Directed  Orientation:  Full (Time, Place, and Person)  Thought Content:  Logical  Suicidal Thoughts:  No  Homicidal Thoughts:  No  Memory:  Recent;   Fair  Judgement:  Impaired  Insight:  Lacking  Psychomotor Activity:  Normal  Concentration:  Concentration: Fair  Recall:  [employer] of Knowledge:  Fair  Language:  Fair  Akathisia:  No      Assets:  Resilience  ADL's:  Intact  Cognition:  WNL  Sleep:  Number of Hours: 7.5     Treatment Plan Summary: 77 yo female admitted due to paranoia and delusions. Today, she is much more organized than yesterday. No delusional thoughts content evident today. She will get second iNvega injection today.   Plan:  Schizoaffective disorder -She will get second [place] injection today. Next dose will be due 09/06/18 -Continue Depakote ER 500 gm qhs  Dispo -She will discharge home tomorrow. She will follow up with Dr. Nicolasa Ducking on Monday. I will place her on outpatinet commitment Marylin Crosby,  MD 08/09/2018, 3:42 PM

## 2018-08-09 NOTE — Progress Notes (Signed)
Patient received Invega injection, tolerated well.

## 2018-08-09 NOTE — Tx Team (Signed)
Interdisciplinary Treatment and Diagnostic Plan Update  08/09/2018 Time of Session: 11:30am Jody Howard MRN: [id number]  Principal Diagnosis: Schizoaffective disorder [facility])  Secondary Diagnoses: Principal Problem:   Schizoaffective disorder ([place]) Active Problems:   Delusional disorder ([place])   Current Medications:  Current Facility-Administered Medications  Medication Dose Route Frequency Provider Last Rate Last Dose  . acetaminophen (TYLENOL) tablet 650 mg  650 mg Oral Q6H PRN Clapacs, John T, MD      . alum & mag hydroxide-simeth (MAALOX/MYLANTA) 200-200-20 MG/5ML suspension 30 mL  30 mL Oral Q4H PRN Clapacs, Madie Reno, MD      . divalproex (DEPAKOTE ER) 24 hr tablet 500 mg  500 mg Oral QHS He, Jun, MD   500 mg at 08/08/18 2145  . hydrOXYzine (ATARAX/VISTARIL) tablet 10 mg  10 mg Oral TID PRN Clapacs, Madie Reno, MD      . magnesium hydroxide (MILK OF MAGNESIA) suspension 30 mL  30 mL Oral Daily PRN Clapacs, John T, MD      . paliperidone (INVEGA SUSTENNA) injection 156 mg  156 mg Intramuscular Once McNew, Holly R, MD      . propranolol (INDERAL) tablet 20 mg  20 mg Oral TID PRN Rise Mu, PA-C   20 mg at 08/03/18 5852  . traZODone (DESYREL) tablet 50 mg  50 mg Oral QHS PRN Clapacs, Madie Reno, MD   50 mg at 08/05/18 2136   PTA Medications: Medications Prior to Admission  Medication Sig Dispense Refill Last Dose  . CALCIUM PO Take 1 tablet by mouth daily.    Taking  . Cholecalciferol (VITAMIN D-3 PO) Take 1 capsule by mouth daily. Taking 2000 IU daily   Taking  . CRESTOR 5 MG tablet TAKE 1/2 TABLET(2.5 MG) BY MOUTH DAILY 30 tablet 5 Taking  . meloxicam (MOBIC) 15 MG tablet Take 1 tablet by mouth daily.   Taking  . Multiple Vitamins-Minerals (MULTIVITAMIN PO) Take 1 tablet by mouth daily.    Taking    Patient Stressors: Loss of spouse Other: declining cognitively   Patient Strengths: Average or above average intelligence Communication skills Financial means Physical  Health Supportive family/friends  Treatment Modalities: Medication Management, Group therapy, Case management,  1 to 1 session with clinician, Psychoeducation, Recreational therapy.   Physician Treatment Plan for Primary Diagnosis: Schizoaffective disorder ([place]) Long Term Goal(s): Improvement in symptoms so as ready for discharge NA   Short Term Goals: Ability to identify changes in lifestyle to reduce recurrence of condition will improve Ability to verbalize feelings will improve Ability to disclose and discuss suicidal ideas Ability to demonstrate self-control will improve Ability to identify and develop effective coping behaviors will improve Ability to maintain clinical measurements within normal limits will improve Compliance with prescribed medications will improve Ability to identify triggers associated with substance abuse/mental health issues will improve NA  Medication Management: Evaluate patient's response, side effects, and tolerance of medication regimen.  Therapeutic Interventions: 1 to 1 sessions, Unit Group sessions and Medication administration.  Evaluation of Outcomes: Progressing  Physician Treatment Plan for Secondary Diagnosis: Principal Problem:   Schizoaffective disorder ([place]) Active Problems:   Delusional disorder ([place])  Long Term Goal(s): Improvement in symptoms so as ready for discharge NA   Short Term Goals: Ability to identify changes in lifestyle to reduce recurrence of condition will improve Ability to verbalize feelings will improve Ability to disclose and discuss suicidal ideas Ability to demonstrate self-control will improve Ability to identify and develop effective coping behaviors will improve  Ability to maintain clinical measurements within normal limits will improve Compliance with prescribed medications will improve Ability to identify triggers associated with substance abuse/mental health issues will improve NA     Medication  Management: Evaluate patient's response, side effects, and tolerance of medication regimen.  Therapeutic Interventions: 1 to 1 sessions, Unit Group sessions and Medication administration.  Evaluation of Outcomes: Progressing   RN Treatment Plan for Primary Diagnosis: Schizoaffective disorder ([place]) Long Term Goal(s): Knowledge of disease and therapeutic regimen to maintain health will improve  Short Term Goals: Ability to participate in decision making will improve, Ability to identify and develop effective coping behaviors will improve and Compliance with prescribed medications will improve  Medication Management: RN will administer medications as ordered by provider, will assess and evaluate patient's response and provide education to patient for prescribed medication. RN will report any adverse and/or side effects to prescribing provider.  Therapeutic Interventions: 1 on 1 counseling sessions, Psychoeducation, Medication administration, Evaluate responses to treatment, Monitor vital signs and CBGs as ordered, Perform/monitor CIWA, COWS, AIMS and Fall Risk screenings as ordered, Perform wound care treatments as ordered.  Evaluation of Outcomes: Progressing   LCSW Treatment Plan for Primary Diagnosis: Schizoaffective disorder ([place]) Long Term Goal(s): Safe transition to appropriate next level of care at discharge, Engage patient in therapeutic group addressing interpersonal concerns.  Short Term Goals: Engage patient in aftercare planning with referrals and resources, Increase social support, Facilitate patient progression through stages of change regarding substance use diagnoses and concerns, Identify triggers associated with mental health/substance abuse issues and Increase skills for wellness and recovery  Therapeutic Interventions: Assess for all discharge needs, 1 to 1 time with Social worker, Explore available resources and support systems, Assess for adequacy in community support  network, Educate family and significant other(s) on suicide prevention, Complete Psychosocial Assessment, Interpersonal group therapy.  Evaluation of Outcomes: Progressing   Progress in Treatment: Attending groups: Yes. Participating in groups: Yes. Taking medication as prescribed: Yes. Toleration medication: Yes. Family/Significant other contact made: Yes, individual(s) contacted:  Patients Son Patient understands diagnosis: No. Discussing patient identified problems/goals with staff: Yes. Medical problems stabilized or resolved: Yes. Denies suicidal/homicidal ideation: Yes. Issues/concerns per patient self-inventory: No. Other:   New problem(s) identified: No, Describe:  None  New Short Term/Long Term Goal(s): "To go home."  Patient Goals:  "To go home."  Discharge Plan or Barriers: To return home and follow up with outpatient services.  Reason for Continuation of Hospitalization: Delusions  Medication stabilization  Estimated Length of Stay: 1 day   Recreational Therapy: Patient Stressors: N/A Patient Goal: Patient will focus on task/topic with 2 prompts from staff within 5 recreation therapy group sessions  Attendees: Patient:  08/09/2018 11:21 AM  Physician: Dr. Amador Cunas, MD 08/09/2018 11:21 AM  Nursing: Jerry Caras, RN 08/09/2018 11:21 AM  RN Care Manager: 08/09/2018 11:21 AM  Social Worker: Gary Fleet 08/09/2018 11:21 AM  Recreational Therapist: Isaias Sakai. Marcello Fennel, LRT 08/09/2018 11:21 AM  Other: Derrek Gu, LCSW 08/09/2018 11:21 AM  Other: Marney Doctor, [place] 08/09/2018 11:21 AM  Other: 08/09/2018 11:21 AM    Scribe for Treatment Team: Darin Engels, LCSW 08/09/2018 11:21 AM

## 2018-08-09 NOTE — Plan of Care (Signed)
Patient is alert and oriented denies SI, HI and AVH.  Patient is pleasant and cooperative. Patient is meticulous in dress; very animated facial expression also logical and coherent with speech. Patient attends group sessions and is eating meals regularly. Patient rates pain level 0/10; blood pressure 89/56 patient encouraged to drink fluids (water). Patients Invega injection not into pharmacy at this time but nurse will give injection once medication is available.  Problem: Safety: Goal: Periods of time without injury will increase Outcome: Progressing   Problem: Activity: Goal: Will verbalize the importance of balancing activity with adequate rest periods Outcome: Progressing   Problem: Coping: Goal: Coping ability will improve Outcome: Progressing Goal: Will verbalize feelings Outcome: Progressing   Problem: Education: Goal: Knowledge of [place] [facility] Education information/materials will improve Outcome: Progressing Goal: Emotional status will improve Outcome: Progressing Goal: Mental status will improve Outcome: Progressing Goal: Verbalization of understanding the information provided will improve Outcome: Progressing

## 2018-08-09 NOTE — BHH Suicide Risk Assessment (Signed)
[facility] Discharge Suicide Risk Assessment   Principal Problem: Schizoaffective disorder ([place])  Mental Status Per Nursing Assessment::   On Admission:  NA  Demographic Factors:  Caucasian and Living alone  Loss Factors: NA  Historical Factors: NA  Risk Reduction Factors:   Sense of responsibility to family, Positive social support and Positive therapeutic relationship  Continued Clinical Symptoms:  Delusions  Cognitive Features That Contribute To Risk:  Thought constriction (tunnel vision)    Suicide Risk:  Minimal: No identifiable suicidal ideation.   Follow-up Information    Chauncey Mann, MD. Go on 08/14/2018.   Specialty:  Psychiatry Why:  Please follow up with Dr. Nicolasa Ducking on Monday August 14, 2018 at 4:30pm. Please bring all of your medications, Identifications and insurance information. Thank you. Contact information: [place] [address] [phone]            Marylin Crosby, MD 08/09/2018, 4:03 PM

## 2018-08-09 NOTE — BHH Group Notes (Signed)
LCSW Group Therapy Note  08/09/2018 1:00 pm  Type of Therapy/Topic:  Group Therapy:  Emotion Regulation  Participation Level:  Minimal   Description of Group:    The purpose of this group is to assist patients in learning to regulate negative emotions and experience positive emotions. Patients will be guided to discuss ways in which they have been vulnerable to their negative emotions. These vulnerabilities will be juxtaposed with experiences of positive emotions or situations, and patients will be challenged to use positive emotions to combat negative ones. Special emphasis will be placed on coping with negative emotions in conflict situations, and patients will process healthy conflict resolution skills.  Therapeutic Goals: 1. Patient will identify two positive emotions or experiences to reflect on in order to balance out negative emotions 2. Patient will label two or more emotions that they find the most difficult to experience 3. Patient will demonstrate positive conflict resolution skills through discussion and/or role plays  Summary of Patient Progress:  Jody Howard did not participate much in today's group discussion on emotion regulation.  Jody Howard did share at the end of the group that she thinks that "people take things too serious now and we need to have more humor in our lives as well as learn to communicate more by talking to each other instead of being on a phone or texting."     Therapeutic Modalities:   Cognitive Behavioral Therapy Feelings Identification Dialectical Behavioral Therapy

## 2018-08-10 NOTE — Progress Notes (Signed)
Recreation Therapy Notes  Date: 08/10/2018  Time: 9:30 am  Location: Craft Room  Behavioral response: Appropriate    Intervention Topic: Coping  Discussion/Intervention:  Group content on today was focused on coping skills. The group defined what coping skills are and when they can be used. Individuals described how they normally cope with things and the coping skills they normally use. Patients expressed why it is important to cope with things and how not coping with things can affect you. The group participated in the intervention "My coping box" and made coping boxes while adding coping skills they could use in the future to the box.  Clinical Observations/Feedback:  Patient came to group and defined coping skills as making good decisions. Individual was social with peers and staff while as participating in the intervention.  Jessicaann Overbaugh LRT/CTRS         Kelvon Giannini 08/10/2018 10:56 AM

## 2018-08-10 NOTE — Progress Notes (Signed)
Patient stayed in the milieu until bedtime. Alert and oriented and denying thoughts of self harm. Attended group and interacted with staff and peers appropriately. At medication time, patient expressed her concerns about her scheduled medication (Depakote), saying that it makes her "even more sick". Patient continues to say that she does not need to be here, that her admission was a mistake. Has mild confusion at times. Patient went to bed and slept all night. Woke up in the morning for vital signs: calm and cooperative. Expressing readiness for discharge. No sign of distress. Staff continue to provide support and encouragements.

## 2018-08-10 NOTE — Progress Notes (Signed)
  [facility] Adult Case Management Discharge Plan :  Will you be returning to the same living situation after discharge:  Yes,  home At discharge, do you have transportation home?: Yes,  family Do you have the ability to pay for your medications: Yes,  insurance  Release of information consent forms completed and in the chart;  Patient's signature needed at discharge.  Patient to Follow up at: Follow-up Information    Chauncey Mann, MD. Go on 08/14/2018.   Specialty:  Psychiatry Why:  Please follow up with Dr. Nicolasa Ducking on Monday August 14, 2018 at 4:30pm. Please bring all of your medications, Identifications and insurance information. Thank you. Contact information: [place] [address] [phone]           Next level of care provider has access to [place] and Suicide Prevention discussed: Yes,  yes  Have you used any form of tobacco in the last 30 days? (Cigarettes, Smokeless Tobacco, Cigars, and/or Pipes): No  Has patient been referred to the Quitline?: N/A patient is not a smoker  Patient has been referred for addiction treatment: [place], LCSW 08/10/2018, 2:01 PM

## 2018-08-10 NOTE — Progress Notes (Signed)
Patient alert and oriented x 4. Ambulates unit with steady gait. Verbally denies SI/HI/AVH and pain. Patient states that she is looking forward to going home today. Patient discharged on above date and time. Verbalized understanding the discharge information provided to patient upon discharge. Patient departed unit with discharge paperwork, prescriptions and personal belongings. Picked up by her son to go home.
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
## 2018-08-10 NOTE — Progress Notes (Signed)
Recreation Therapy Notes  INPATIENT RECREATION TR PLAN  Patient Details Name: Jody Howard MRN: [id number] DOB: 04/29/1941 Today's Date: 08/10/2018  Rec Therapy Plan Is patient appropriate for Therapeutic Recreation?: Yes Treatment times per week: At least 3 Estimated Length of Stay: 5-7 days TR Treatment/Interventions: Group participation (Comment)  Discharge Criteria Pt will be discharged from therapy if:: Discharged Treatment plan/goals/alternatives discussed and agreed upon by:: Patient/family  Discharge Summary Short term goals set: Patient will focus on task/topic with 2 prompts from staff within 5 recreation therapy group sessions Short term goals met: Complete Progress toward goals comments: Groups attended Which groups?: Coping skills, Goal setting, Communication, Other (Comment)(Problem Solving, Emotions, Team work) Reason goals not met: N/A Therapeutic equipment acquired: N/A Reason patient discharged from therapy: Discharge from hospital Pt/family agrees with progress & goals achieved: Yes Date patient discharged from therapy: 08/10/18   Brigett Estell 08/10/2018, 2:56 PM

## 2018-08-10 NOTE — Plan of Care (Signed)
Expressing readiness for discharge 

## 2018-08-11 ENCOUNTER — Telehealth: Payer: Self-pay | Admitting: Internal Medicine

## 2018-08-11 NOTE — Telephone Encounter (Signed)
Tried to reach patient by phone no answer voicemail is full for TCM call after hospitalization.

## 2018-08-14 DIAGNOSIS — F250 Schizoaffective disorder, bipolar type: Secondary | ICD-10-CM | POA: Diagnosis not present

## 2018-08-14 DIAGNOSIS — Z79899 Other long term (current) drug therapy: Secondary | ICD-10-CM | POA: Diagnosis not present

## 2018-08-14 DIAGNOSIS — F22 Delusional disorders: Secondary | ICD-10-CM | POA: Diagnosis not present

## 2018-08-21 DIAGNOSIS — F250 Schizoaffective disorder, bipolar type: Secondary | ICD-10-CM | POA: Diagnosis not present

## 2018-08-21 DIAGNOSIS — F22 Delusional disorders: Secondary | ICD-10-CM | POA: Diagnosis not present

## 2018-08-21 DIAGNOSIS — Z79899 Other long term (current) drug therapy: Secondary | ICD-10-CM | POA: Diagnosis not present

## 2018-08-22 ENCOUNTER — Ambulatory Visit: Payer: Self-pay | Admitting: Genetic Counselor

## 2018-08-22 ENCOUNTER — Telehealth: Payer: Self-pay | Admitting: Genetic Counselor

## 2018-08-22 DIAGNOSIS — Z1379 Encounter for other screening for genetic and chromosomal anomalies: Secondary | ICD-10-CM | POA: Insufficient documentation

## 2018-08-22 NOTE — Telephone Encounter (Signed)
Revealed negative genetic testing.  Discussed that we do not know why there is cancer in the family. It could be due to a different gene that we are not testing, or maybe our current technology may not be able to pick something up.  It will be important for her to keep in contact with genetics to keep up with whether additional testing may be needed.  

## 2018-08-22 NOTE — Progress Notes (Signed)
HPI:  [name] was previously seen in the [facility] clinic due to a family history of cancer and concerns regarding a hereditary predisposition to cancer. Please refer to our prior cancer genetics clinic note for more information regarding Jody Howard's medical, social and family histories, and our assessment and recommendations, at the time. Jody Howard's recent genetic test results were disclosed to her, as were recommendations warranted by these results. These results and recommendations are discussed in more detail below.  CANCER HISTORY:   No history exists.    FAMILY HISTORY:  We obtained a detailed, 4-generation family history.  Significant diagnoses are listed below: Family History  Problem Relation Age of Onset  . Breast cancer Mother 61  . Cancer Paternal Aunt        colon cancer  . Multiple myeloma Father        dx in his 19s  . Breast cancer Sister 17       d. 29  . Heart attack Sister        due to iodine reaction  . Breast cancer Sister 7       d. 75  . Breast cancer Cousin        dx twice, first time possibly under 43; mat first cousin  . Breast cancer Maternal Aunt        dx in her 69s    The patient has two children who are cancer free.  She had two sisters, who each had breast cancer in their 9's.  Both parents are deceased.   The patient's mother was diagnosed with breast cancer at 44.  She had four sisters and a brother.  One sister had breast cancer in her 12's-70's, and had a daughter who had breast cancer twice, the first time under age 91.  The maternal grandparents are deceased.  The patient's father had multiple myeloma in his 60's.  He had one sister who is deceased.  Both paternal grandparents are deceased.  Jody Howard is unaware of previous family history of genetic testing for hereditary cancer risks. Patient's maternal ancestors are of [place] descent, and paternal ancestors are of [place] descent. There is no reported Ashkenazi  Jewish ancestry. There is no known consanguinity.  GENETIC TEST RESULTS: Genetic testing reported out on August 08, 2018 through the multi-cancer panel found no deleterious mutations. The Multi-Gene Panel offered by [facility] includes sequencing and/or deletion duplication testing of the following 84 genes: AIP, ALK, APC, ATM, AXIN2,BAP1,  BARD1, BLM, BMPR1A, BRCA1, BRCA2, BRIP1, CASR, CDC73, CDH1, CDK4, CDKN1B, CDKN1C, CDKN2A (p14ARF), CDKN2A (p16INK4a), CEBPA, CHEK2, CTNNA1, DICER1, DIS3L2, EGFR (c.2369C>T, p.Thr790Met variant only), EPCAM (Deletion/duplication testing only), FH, FLCN, GATA2, GPC3, GREM1 (Promoter region deletion/duplication testing only), HOXB13 (c.251G>A, p.Gly84Glu), HRAS, KIT, MAX, MEN1, MET, MITF (c.952G>A, p.Glu318Lys variant only), MLH1, MSH2, MSH3, MSH6, MUTYH, NBN, NF1, NF2, NTHL1, PALB2, PDGFRA, PHOX2B, PMS2, POLD1, POLE, POT1, PRKAR1A, PTCH1, PTEN, RAD50, RAD51C, RAD51D, RB1, RECQL4, RET, RUNX1, SDHAF2, SDHA (sequence changes only), SDHB, SDHC, SDHD, SMAD4, SMARCA4, SMARCB1, SMARCE1, STK11, SUFU, TERC, TERT, TMEM127, TP53, TSC1, TSC2, VHL, WRN and WT1.  The test report has been scanned into EPIC and is located under the Molecular Pathology section of the Results Review tab.    We discussed with Jody Howard that since the current genetic testing is not perfect, it is possible there may be a gene mutation in one of these genes that current testing cannot detect, but that chance is small.  We also discussed, that it  is possible that another gene that has not yet been discovered, or that we have not yet tested, is responsible for the cancer diagnoses in the family, and it is, therefore, important to remain in touch with cancer genetics in the future so that we can continue to offer Jody Howard the most up to date genetic testing.    CANCER SCREENING RECOMMENDATIONS: This normal result is reassuring and indicates that Jody Howard does not likely have an increased risk of cancer due to a  mutation in one of these genes.  We, therefore, recommended  Jody Howard continue to follow the cancer screening guidelines provided by her primary healthcare providers.   An individual's cancer risk and medical management are not determined by genetic test results alone. Overall cancer risk assessment incorporates additional factors, including personal medical history, family history, and any available genetic information that may result in a personalized plan for cancer prevention and surveillance.  RECOMMENDATIONS FOR FAMILY MEMBERS:  Women in this family might be at some increased risk of developing cancer, over the general population risk, simply due to the family history of cancer.  We recommended women in this family have a yearly mammogram beginning at age 85, or 33 years younger than the earliest onset of cancer, an annual clinical breast exam, and perform monthly breast self-exams. Women in this family should also have a gynecological exam as recommended by their primary provider. All family members should have a colonoscopy by age 9.  FOLLOW-UP: Lastly, we discussed with Jody Howard that cancer genetics is a rapidly advancing field and it is possible that new genetic tests will be appropriate for her and/or her family members in the future. We encouraged her to remain in contact with cancer genetics on an annual basis so we can update her personal and family histories and let her know of advances in cancer genetics that may benefit this family.   Our contact number was provided. Jody Howard's questions were answered to her satisfaction, and she knows she is welcome to call us at anytime with additional questions or concerns.   Jody Kayser, MS, [facility] Certified Genetic Counselor [email or web address]

## 2018-08-24 ENCOUNTER — Telehealth: Payer: Self-pay | Admitting: Cardiovascular Disease

## 2018-08-24 NOTE — Telephone Encounter (Signed)
Per Gabriel Cirri, Dr. Nicolasa Ducking was calling to have this patient scheduled to see Dr. Caryl Comes tomorrow. Dr. Nicolasa Ducking advised that Dr. Caryl Comes is not in the office tomorrow. The patient has been added to Dr. Tyrell Antonio schedule on 08/25/18.

## 2018-08-24 NOTE — Telephone Encounter (Signed)
Patient c/o Palpitations:  High priority if patient c/o lightheadedness, shortness of breath, or chest pain  1) How long have you had palpitations/irregular HR/ Afib? Are you having the symptoms now? no  2) Are you currently experiencing lightheadedness, SOB or CP?  Heart is "beating funny"  3) Do you have a history of afib (atrial fibrillation) or irregular heart rhythm?  4) Have you checked your BP or HR? (document readings if available): daughter told her her HR was "high"   5) Are you experiencing any other symptoms? Weakness, very tired

## 2018-08-25 ENCOUNTER — Encounter

## 2018-08-25 ENCOUNTER — Encounter: Payer: Self-pay | Admitting: Cardiovascular Disease

## 2018-08-25 ENCOUNTER — Ambulatory Visit (INDEPENDENT_AMBULATORY_CARE_PROVIDER_SITE_OTHER): Payer: Medicare Other

## 2018-08-25 ENCOUNTER — Ambulatory Visit: Payer: Self-pay | Admitting: Cardiovascular Disease

## 2018-08-25 ENCOUNTER — Ambulatory Visit (INDEPENDENT_AMBULATORY_CARE_PROVIDER_SITE_OTHER): Payer: Medicare Other | Admitting: Cardiovascular Disease

## 2018-08-25 VITALS — BP 100/58 | HR 70 | Ht 64.0 in | Wt 118.5 lb

## 2018-08-25 DIAGNOSIS — E785 Hyperlipidemia, unspecified: Secondary | ICD-10-CM

## 2018-08-25 DIAGNOSIS — I471 Supraventricular tachycardia: Secondary | ICD-10-CM | POA: Diagnosis not present

## 2018-08-25 NOTE — Progress Notes (Signed)
Cardiology Office Note   Date:  08/25/2018   ID:  Jody Howard, DOB Jan 30, 1941, MRN [id number]  PCP:  Einar Pheasant, MD  Cardiologist:   Kathlyn Sacramento, MD   Chief Complaint  Patient presents with  . other    Ref by Dr. Nicolasa Ducking for tachycardia. Meds reviewed by the pt. verbally. Pt. c/o rapid heart beats; she had a cardiac ablation       History of Present Illness: Jody Howard is a 77 y.o. female who presents for evaluation of intermittent tachycardia.  She has known history of supraventricular tachycardia status post ablation more than 10 years ago at [facility].  No history of ischemic heart disease or heart failure.  She has been overall healthy throughout her life.  She does have hyperlipidemia and takes rosuvastatin.  Family history is negative for premature coronary artery disease, arrhythmia or sudden death.  There is no history of tobacco use or alcohol use.  She consumes very little amount of caffeine. Unfortunately, she has been dealing with significant stress and delusions which required hospitalization.  Her husband died few years ago from cancer.  She reports significant difficulties at end of life especially after he had his frontal lobe removed. She had intermittent episodes of paroxysmal tachycardia over the last few months.  These are usually associated with palpitations but no significant dizziness, syncope or presyncope.  No chest pain or shortness of breath.  While hospitalized recently at the psychiatric unit, she was noted to have a heart rate between 140 to 150 bpm.  She was prescribed propranolol to be used as needed but her blood pressure tends to run on the low side she has not been taking this medication.  She reports that a lot of her symptoms were triggered by Depakote.    Past Medical History:  Diagnosis Date  . Anemia   . Atrial tachycardia (HCC)    s/p ablation  . Clostridium difficile infection 1997   required  hospitalization  . Family history of breast cancer   . Hyperlipidemia   . Mitral valve prolapse   . Paroxysmal supraventricular tachycardia ([place])   . Rheumatic fever     Past Surgical History:  Procedure Laterality Date  . ABLATION     for atrial tachycardia  . BREAST BIOPSY  1997  . TONSILLECTOMY  1944     Current Outpatient Medications  Medication Sig Dispense Refill  . CALCIUM PO Take 1 tablet by mouth daily.     . Cholecalciferol (VITAMIN D-3 PO) Take 1 capsule by mouth daily. Taking 2000 IU daily    . CRESTOR 5 MG tablet TAKE 1/2 TABLET(2.5 MG) BY MOUTH DAILY 30 tablet 5  . Multiple Vitamins-Minerals (MULTIVITAMIN PO) Take 1 tablet by mouth daily.     Derrill Memo ON 09/06/2018] Paliperidone Palmitate ER (INVEGA SUSTENNA) 117 MG/0.75ML SUSY Inject 117 mg into the muscle every 28 (twenty-eight) days. Injection due 09/06/18 1 Syringe 0   No current facility-administered medications for this visit.     Allergies:   Codeine; Lipitor [atorvastatin]; Simvastatin; Other; Silicone; and Flagyl [metronidazole]    Social History:  The patient  reports that she has never smoked. She has never used smokeless tobacco. She reports that she drinks alcohol. She reports that she does not use drugs.   Family History:  The patient's family history includes Breast cancer in her cousin and maternal aunt; Breast cancer (age of onset: 65) in her sister; Breast cancer (age of  onset: 55) in her sister; Breast cancer (age of onset: 71) in her mother; Cancer in her paternal aunt; Heart attack in her sister; Multiple myeloma in her father.    ROS:  Please see the history of present illness.   Otherwise, review of systems are positive for none.   All other systems are reviewed and negative.    PHYSICAL EXAM: VS:  BP (!) 100/58 (BP Location: Right Arm, Patient Position: Sitting, Cuff Size: Normal)   Pulse 70   Ht _0  (1.626 m)   Wt 118 lb 8 oz (53.8 kg)   LMP 01/29/1983   BMI 20.34 kg/m  , BMI Body  mass index is 20.34 kg/m. GEN: Well nourished, well developed, in no acute distress  HEENT: normal  Neck: no JVD, carotid bruits, or masses Cardiac: RRR; no murmurs, rubs, or gallops,no edema  Respiratory:  clear to auscultation bilaterally, normal work of breathing GI: soft, nontender, nondistended, + BS MS: no deformity or atrophy  Skin: warm and dry, no rash Neuro:  Strength and sensation are intact Psych: euthymic mood, full affect   EKG:  EKG is ordered today. The ekg ordered today demonstrates normal sinus rhythm with no significant ST or T wave changes.   Recent Labs: 07/28/2018: ALT 14; Hemoglobin 13.8; Platelets 370 07/29/2018: TSH 2.562 08/03/2018: BUN 24; Creatinine, Ser 0.77; Potassium 4.8; Sodium 143    Lipid Panel    Component Value Date/Time   CHOL 206 (H) 07/28/2018 1549   TRIG 68 07/28/2018 1549   HDL 58 07/28/2018 1549   CHOLHDL 3.6 07/28/2018 1549   VLDL 14 07/28/2018 1549   LDLCALC 134 (H) 07/28/2018 1549      Wt Readings from Last 3 Encounters:  08/25/18 118 lb 8 oz (53.8 kg)  07/29/18 110 lb (49.9 kg)  05/22/18 111 lb (50.3 kg)     No flowsheet data found.    ASSESSMENT AND PLAN:  1.  Paroxysmal supraventricular tachycardia: The patient's symptoms are consistent with persistent supraventricular tachycardia.  During physical exam and while I was listening to her heart, she had a short-term of tachycardia lasting about 30 seconds.  The heart rate was at least 140 bpm. I think it is difficult to treat her with a beta-blocker or calcium channel blocker given that her blood pressure is low. I am going to obtain a 2-week outpatient monitor and will also obtain an echocardiogram. Based on her frequency of tachycardia, she might require an antiarrhythmic medication.  2.  Hyperlipidemia: Currently on small dose rosuvastatin.    Disposition:   FU with me in 1 month  Signed,  Kathlyn Sacramento, MD  08/25/2018 11:07 AM    [place]

## 2018-08-25 NOTE — Patient Instructions (Signed)
Medication Instructions: Your physician recommends that you continue on your current medications as directed. Please refer to the Current Medication list given to you today.  If you need a refill on your cardiac medications before your next appointment, please call your pharmacy.   Procedures/Testing: Your physician has recommended that you wear a 14 day Zio event monitor. Event monitors are medical devices that record the heart's electrical activity. Doctors most often [place] these monitors to diagnose arrhythmias. Arrhythmias are problems with the speed or rhythm of the heartbeat. The monitor is a small, portable device. You can wear one while you do your normal daily activities. This is usually used to diagnose what is causing palpitations/syncope (passing out).  Your physician has requested that you have an echocardiogram. Echocardiography is a painless test that uses sound waves to create images of your heart. It provides your doctor with information about the size and shape of your heart and how well your heart's chambers and valves are working. You may receive an ultrasound enhancing agent through an IV if needed to better visualize your heart during the echo.This procedure takes approximately one hour. There are no restrictions for this procedure. This will take place at the [facility] clinic.    Follow-Up: Your physician wants you to follow-up in one month with Dr. Fletcher Anon.   Thank you for choosing Heartcare at [facility]!

## 2018-09-01 ENCOUNTER — Other Ambulatory Visit: Payer: Self-pay

## 2018-09-01 DIAGNOSIS — R413 Other amnesia: Secondary | ICD-10-CM | POA: Diagnosis not present

## 2018-09-05 DIAGNOSIS — F250 Schizoaffective disorder, bipolar type: Secondary | ICD-10-CM | POA: Diagnosis not present

## 2018-09-05 DIAGNOSIS — Z79899 Other long term (current) drug therapy: Secondary | ICD-10-CM | POA: Diagnosis not present

## 2018-09-05 DIAGNOSIS — F22 Delusional disorders: Secondary | ICD-10-CM | POA: Diagnosis not present

## 2018-09-06 DIAGNOSIS — R413 Other amnesia: Secondary | ICD-10-CM | POA: Diagnosis not present

## 2018-09-07 ENCOUNTER — Ambulatory Visit: Payer: Self-pay | Admitting: Cardiovascular Disease

## 2018-09-08 ENCOUNTER — Ambulatory Visit (INDEPENDENT_AMBULATORY_CARE_PROVIDER_SITE_OTHER): Payer: Medicare Other

## 2018-09-08 DIAGNOSIS — I471 Supraventricular tachycardia: Secondary | ICD-10-CM

## 2018-09-14 DIAGNOSIS — R413 Other amnesia: Secondary | ICD-10-CM | POA: Diagnosis not present

## 2018-09-15 ENCOUNTER — Telehealth: Payer: Self-pay | Admitting: Internal Medicine

## 2018-09-15 NOTE — Telephone Encounter (Signed)
Copied from [place] [phone]. Topic: Quick Communication - See Telephone Encounter >> Sep 15, 2018 12:52 PM Blase Mess A wrote: CRM for notification. See Telephone encounter for: 09/15/18. Grayling Congress  [phone]  is an attorney Guardian ad Litem.  Is trying to determine competency. Please advise and call back.

## 2018-09-26 DIAGNOSIS — F22 Delusional disorders: Secondary | ICD-10-CM | POA: Diagnosis not present

## 2018-09-26 DIAGNOSIS — F250 Schizoaffective disorder, bipolar type: Secondary | ICD-10-CM | POA: Diagnosis not present

## 2018-09-27 NOTE — Progress Notes (Signed)
Cardiology Office Note Date:  09/28/2018  Patient ID:  Jody Howard, DOB [birth year], MRN [id number] PCP:  Einar Pheasant, MD  Cardiologist:  Dr. Fletcher Anon, MD    Chief Complaint: Follow up  History of Present Illness: Jody Howard is a 77 y.o. female with history of SVT s/p ablation > 10 years prior at the [facility], recent diagnosis of delusions, and HLD who presents for follow up of PSVT.   Her husband passes away a few years ago and she has had difficulties with his end of life. In this setting, she has had intermittent episodes of palpitations over the prior few months that have not been associated with dizziness, presyncope, or syncope.   She has recently been under increased stress complicated by delusions which has required hospitalization. While in the behavorial health unit she was noted to have a heart rate intermittently up to 140-150 bpm. She was started on prn propranolol, though due to relative hypotension she was not taking this medication. She was most recently seen by Dr. Fletcher Anon on 08/25/18 and noted she felt like her symptoms were triggered by Depakote. At that visit, she was noted to have a short run of tachycardia lasting about 30-seconds while she was being examined with a rate of at least 140 bpm. Echo was done on 09/08/18 that showed an EF of 55-60%, no RWMA, Gr1DD, mild to moderate MR, LA normal in size, RVSF normal, PASP normal. 2-week Zio has been completed though is not yet scanned in for preliminary review.   Labs from 07/2018 showed potassium 4.8, TSH  2.562, UDS negative, RPR non reactive, LDL 134, A1c 4.9, CBC unremarkable, ethanol < 10.  She comes in doing well today and is without complaints. She completely denies having and palpitations, dizziness, presyncope, or syncope. She reports having had a couple episodes of palpitations while wearing the Zio. She states "I hope I mailed that monitor back." She states several times today, "there  is nothing wrong with me."   Past Medical History:  Diagnosis Date  . Anemia   . Atrial tachycardia (HCC)    s/p ablation  . Clostridium difficile infection 1997   required hospitalization  . Family history of breast cancer   . Hyperlipidemia   . Mitral valve prolapse   . Paroxysmal supraventricular tachycardia ([place])   . Rheumatic fever     Past Surgical History:  Procedure Laterality Date  . ABLATION     for atrial tachycardia  . BREAST BIOPSY  1997  . TONSILLECTOMY  1944    Current Meds  Medication Sig  . CALCIUM PO Take 1 tablet by mouth daily.   . Cholecalciferol (VITAMIN D-3 PO) Take 1 capsule by mouth daily. Taking 2000 IU daily  . CRESTOR 5 MG tablet TAKE 1/2 TABLET(2.5 MG) BY MOUTH DAILY  . Multiple Vitamins-Minerals (MULTIVITAMIN PO) Take 1 tablet by mouth daily.   . Paliperidone Palmitate ER (INVEGA SUSTENNA) 117 MG/0.75ML SUSY Inject 117 mg into the muscle every 28 (twenty-eight) days. Injection due 09/06/18    Allergies:   Codeine; Lipitor [atorvastatin]; Simvastatin; Other; Silicone; and Flagyl [metronidazole]   Social History:  The patient  reports that she has never smoked. She has never used smokeless tobacco. She reports that she drinks alcohol. She reports that she does not use drugs.   Family History:  The patient's family history includes Breast cancer in her cousin and maternal aunt; Breast cancer (age of onset: 28) in her sister; Breast cancer (  age of onset: 57) in her sister; Breast cancer (age of onset: 12) in her mother; Cancer in her paternal aunt; Heart attack in her sister; Multiple myeloma in her father.  ROS:   Review of Systems  Constitutional: Negative for chills, diaphoresis, fever, malaise/fatigue and weight loss.  HENT: Negative for congestion.   Eyes: Negative for discharge and redness.  Respiratory: Negative for cough, hemoptysis, sputum production, shortness of breath and wheezing.   Cardiovascular: Negative for chest pain,  palpitations, orthopnea, claudication, leg swelling and PND.  Gastrointestinal: Negative for abdominal pain, blood in stool, heartburn, melena, nausea and vomiting.  Genitourinary: Negative for hematuria.  Musculoskeletal: Negative for falls and myalgias.  Skin: Negative for rash.  Neurological: Negative for dizziness, tingling, tremors, sensory change, speech change, focal weakness, loss of consciousness and weakness.  Endo/Heme/Allergies: Does not bruise/bleed easily.  Psychiatric/Behavioral: Negative for substance abuse. The patient is not nervous/anxious.   All other systems reviewed and are negative.    PHYSICAL EXAM:  VS:  BP (!) 102/58 (BP Location: Left Arm, Patient Position: Sitting, Cuff Size: Normal)   Pulse 70   Ht 5' 4"  (1.626 m)   Wt 116 lb (52.6 kg)   LMP 01/29/1983   BMI 19.91 kg/m  BMI: Body mass index is 19.91 kg/m.  Physical Exam  Constitutional: She is oriented to person, place, and time. She appears well-developed and well-nourished.  HENT:  Head: Normocephalic and atraumatic.  Eyes: Right eye exhibits no discharge. Left eye exhibits no discharge.  Neck: Normal range of motion. No JVD present.  Cardiovascular: Normal rate, regular rhythm, S1 normal, S2 normal and normal heart sounds. Exam reveals no distant heart sounds, no friction rub, no midsystolic click and no opening snap.  No murmur heard. Pulses:      Posterior tibial pulses are 2+ on the right side, and 2+ on the left side.  Pulmonary/Chest: Effort normal and breath sounds normal. No respiratory distress. She has no decreased breath sounds. She has no wheezes. She has no rales. She exhibits no tenderness.  Abdominal: Soft. She exhibits no distension. There is no tenderness.  Musculoskeletal: She exhibits no edema.  Neurological: She is alert and oriented to person, place, and time.  Skin: Skin is warm and dry. No cyanosis. Nails show no clubbing.  Psychiatric: She has a normal mood and affect. Her  speech is normal and behavior is normal. Judgment and thought content normal.     EKG:  Was ordered and interpreted by me today. Shows NSR, 70 bpm, normal axis, no acute st/t changes   Recent Labs: 07/28/2018: ALT 14; Hemoglobin 13.8; Platelets 370 07/29/2018: TSH 2.562 08/03/2018: BUN 24; Creatinine, Ser 0.77; Potassium 4.8; Sodium 143  07/28/2018: Cholesterol 206; HDL 58; LDL Cholesterol 134; Total CHOL/HDL Ratio 3.6; Triglycerides 68; VLDL 14   CrCl cannot be calculated (Patient's most recent lab result is older than the maximum 21 days allowed.).   Wt Readings from Last 3 Encounters:  09/28/18 116 lb (52.6 kg)  08/25/18 118 lb 8 oz (53.8 kg)  07/29/18 110 lb (49.9 kg)     Other studies reviewed: Additional studies/records reviewed today include: summarized above  ASSESSMENT AND PLAN:  1. Paroxysmal SVT: No ectopy noted on 12-lead EKG or exam today. She indicates she has not had any recent palpitations. Zio monitor is not available for review at this time. We are checking to see if she did mail this back. If she did not, she will go home to look for it  and mail it in if she finds it. If she cannot find the monitor, she is agreeable to wearing another one. Await results and based on tachycardic burden consider antiarrhythmic therapy as standing beta blocker or calcium channel blocker usage will be difficult secondary to her soft BP.   2. Mitral regurgitation: Noted on echo 09/2018. Monitor clinically and with periodic echo.   3. HLD: LDL of 134 from 07/2018 with normal LFT, Currently on low dose Crestor.   4. Delusions: Appears stable. Per psychiatry.   Disposition: F/u with Dr. Fletcher Anon or an APP in 2-3 months.   Current medicines are reviewed at length with the patient today.  The patient did not have any concerns regarding medicines.  Signed, Christell Faith, PA-C 09/28/2018 10:43 AM     [facility] [address] [place] [address] [place], [place] [address] [phone]

## 2018-09-28 ENCOUNTER — Ambulatory Visit (INDEPENDENT_AMBULATORY_CARE_PROVIDER_SITE_OTHER): Payer: Medicare Other | Admitting: Physician Assistant

## 2018-09-28 ENCOUNTER — Encounter: Payer: Self-pay | Admitting: Physician Assistant

## 2018-09-28 VITALS — BP 102/58 | HR 70 | Ht 64.0 in | Wt 116.0 lb

## 2018-09-28 DIAGNOSIS — F22 Delusional disorders: Secondary | ICD-10-CM | POA: Diagnosis not present

## 2018-09-28 DIAGNOSIS — E782 Mixed hyperlipidemia: Secondary | ICD-10-CM

## 2018-09-28 DIAGNOSIS — I471 Supraventricular tachycardia: Secondary | ICD-10-CM | POA: Diagnosis not present

## 2018-09-28 DIAGNOSIS — I340 Nonrheumatic mitral (valve) insufficiency: Secondary | ICD-10-CM

## 2018-09-28 NOTE — Patient Instructions (Signed)
Medication Instructions:  No changes If you need a refill on your cardiac medications before your next appointment, please call your pharmacy.   Lab work: None ordered  Testing/Procedures: None ordered  Follow-Up: At [facility], you and your health needs are our priority.  As part of our continuing mission to provide you with exceptional heart care, we have created designated Provider Care Teams.  These Care Teams include your primary Cardiologist (physician) and Advanced Practice Providers (APPs -  Physician Assistants and Nurse Practitioners) who all work together to provide you with the care you need, when you need it. You will need a follow up appointment in 2 months.  You may see Dr. Fletcher Anon or one of the following Advanced Practice Providers on your designated Care Team:   Christell Faith, PA-C  Any Other Special Instructions Will Be Listed Below (If Applicable). Please check to see if you have mailed your Zio Monitor back.

## 2018-10-03 DIAGNOSIS — I471 Supraventricular tachycardia: Secondary | ICD-10-CM | POA: Diagnosis not present

## 2018-10-16 DIAGNOSIS — F22 Delusional disorders: Secondary | ICD-10-CM | POA: Diagnosis not present

## 2018-10-16 DIAGNOSIS — F250 Schizoaffective disorder, bipolar type: Secondary | ICD-10-CM | POA: Diagnosis not present

## 2018-10-17 NOTE — Telephone Encounter (Signed)
Please advise 

## 2018-10-18 ENCOUNTER — Telehealth: Payer: Self-pay | Admitting: *Deleted

## 2018-10-18 NOTE — Telephone Encounter (Signed)
Call placed to the patient. She stated that she did not feel like she was having the irregular heart beats anymore and does not want to start any medication. She  stated that she does not do well with medication and thinks it was stress making her heart rate go up.  The patient has a follow up appointment on 12/26/9 with Dr. Fletcher Anon and will discuss this further at that time. If she feels like she is having SVT's, she will call the office back.

## 2018-10-18 NOTE — Telephone Encounter (Signed)
-----   Message from Wellington Hampshire, MD sent at 10/16/2018  4:52 PM EST ----- Inform patient that monitor showed very frequent episodes of SVT.  I suggest we try small dose metoprolol tartrate 12.5 mg twice daily.

## 2018-10-18 NOTE — Telephone Encounter (Signed)
Spoke with [employer]. Information that was needed has been received.

## 2018-10-24 ENCOUNTER — Other Ambulatory Visit: Payer: Self-pay

## 2018-10-30 DIAGNOSIS — D225 Melanocytic nevi of trunk: Secondary | ICD-10-CM | POA: Diagnosis not present

## 2018-10-30 DIAGNOSIS — L918 Other hypertrophic disorders of the skin: Secondary | ICD-10-CM | POA: Diagnosis not present

## 2018-10-30 DIAGNOSIS — L821 Other seborrheic keratosis: Secondary | ICD-10-CM | POA: Diagnosis not present

## 2018-10-30 DIAGNOSIS — M7130 Other bursal cyst, unspecified site: Secondary | ICD-10-CM | POA: Diagnosis not present

## 2018-11-08 ENCOUNTER — Ambulatory Visit (INDEPENDENT_AMBULATORY_CARE_PROVIDER_SITE_OTHER): Payer: Medicare Other | Admitting: Internal Medicine

## 2018-11-08 VITALS — BP 120/70 | HR 70 | Temp 97.9°F | Resp 16 | Wt 117.6 lb

## 2018-11-08 DIAGNOSIS — E7800 Pure hypercholesterolemia, unspecified: Secondary | ICD-10-CM

## 2018-11-08 DIAGNOSIS — Z23 Encounter for immunization: Secondary | ICD-10-CM | POA: Diagnosis not present

## 2018-11-08 DIAGNOSIS — F22 Delusional disorders: Secondary | ICD-10-CM

## 2018-11-08 DIAGNOSIS — F250 Schizoaffective disorder, bipolar type: Secondary | ICD-10-CM | POA: Diagnosis not present

## 2018-11-08 DIAGNOSIS — J479 Bronchiectasis, uncomplicated: Secondary | ICD-10-CM

## 2018-11-08 DIAGNOSIS — D72810 Lymphocytopenia: Secondary | ICD-10-CM | POA: Diagnosis not present

## 2018-11-08 DIAGNOSIS — I471 Supraventricular tachycardia: Secondary | ICD-10-CM

## 2018-11-08 NOTE — Progress Notes (Signed)
Patient ID: Jody Howard, female   DOB: 1941/08/26, 77 y.o.   MRN: [id number]   Subjective:    Patient ID: Jody Howard, female    DOB: November 29, 1941, 77 y.o.   MRN: [id number]  HPI  Patient here for a scheduled follow up.  She was recently discharged from inpatient psychiatry with a diagnosis of schizoaffective disorder, bipolar type as well as delusional disorder.  She is seeing Dr Nicolasa Ducking on a regular basis.  Currently receiving invega sustena injections.  Her son and daughter are her permanent guardians.  She reports she is eating.  No weight loss.  Stays active. Reports no chest pain.  Denies any significant increased heart rate or palpitations.  Seeing cardiology for f/u of paroxysmal SVT.  Overall stable.  Sleeping well.  Reviewed Dr Waylan Boga notes.  Still with fixed delusional thoughts about family.  Reports having a good Thanksgiving and getting together with family.  Denies any depression symptoms.     Past Medical History:  Diagnosis Date  . Anemia   . Atrial tachycardia (HCC)    s/p ablation  . Clostridium difficile infection 1997   required hospitalization  . Family history of breast cancer   . Hyperlipidemia   . Mitral valve prolapse   . Paroxysmal supraventricular tachycardia ([place])   . Rheumatic fever    Past Surgical History:  Procedure Laterality Date  . ABLATION     for atrial tachycardia  . BREAST BIOPSY  1997  . TONSILLECTOMY  1944   Family History  Problem Relation Age of Onset  . Breast cancer Mother 83  . Cancer Paternal Aunt        colon cancer  . Multiple myeloma Father        dx in his 64s  . Breast cancer Sister 8       d. 37  . Heart attack Sister        due to iodine reaction  . Breast cancer Sister 39       d. 46  . Breast cancer Cousin        dx twice, first time possibly under 58; mat first cousin  . Breast cancer Maternal Aunt        dx in her 7s   Social History   Socioeconomic History  . Marital status: Widowed   Spouse name: Not on file  . Number of children: 2  . Years of education: Not on file  . Highest education level: Not on file  Occupational History  . Not on file  Social Needs  . Financial resource strain: Not on file  . Food insecurity:    Worry: Not on file    Inability: Not on file  . Transportation needs:    Medical: Not on file    Non-medical: Not on file  Tobacco Use  . Smoking status: Never Smoker  . Smokeless tobacco: Never Used  Substance and Sexual Activity  . Alcohol use: Yes    Alcohol/week: 0.0 standard drinks    Comment: occasional  . Drug use: No  . Sexual activity: Not Currently  Lifestyle  . Physical activity:    Days per week: Not on file    Minutes per session: Not on file  . Stress: Not on file  Relationships  . Social connections:    Talks on phone: Not on file    Gets together: Not on file    Attends religious service: Not on file    Active member of  club or organization: Not on file    Attends meetings of clubs or organizations: Not on file    Relationship status: Not on file  Other Topics Concern  . Not on file  Social History Narrative  . Not on file    Outpatient Encounter Medications as of 11/08/2018  Medication Sig  . CALCIUM PO Take 1 tablet by mouth daily.   . Cholecalciferol (VITAMIN D-3 PO) Take 1 capsule by mouth daily. Taking 2000 IU daily  . CRESTOR 5 MG tablet TAKE 1/2 TABLET(2.5 MG) BY MOUTH DAILY  . Multiple Vitamins-Minerals (MULTIVITAMIN PO) Take 1 tablet by mouth daily.   . Paliperidone Palmitate ER (INVEGA SUSTENNA) 117 MG/0.75ML SUSY Inject 117 mg into the muscle every 28 (twenty-eight) days. Injection due 09/06/18   No facility-administered encounter medications on file as of 11/08/2018.     Review of Systems  Constitutional: Negative for appetite change and unexpected weight change.  HENT: Negative for congestion and sinus pressure.   Respiratory: Negative for cough, chest tightness and shortness of breath.     Cardiovascular: Negative for chest pain, palpitations and leg swelling.  Gastrointestinal: Negative for abdominal pain, diarrhea, nausea and vomiting.  Genitourinary: Negative for difficulty urinating and dysuria.  Musculoskeletal: Negative for joint swelling and myalgias.  Skin: Negative for color change and rash.  Neurological: Negative for dizziness, light-headedness and headaches.  Psychiatric/Behavioral: Negative for dysphoric mood.       Followed by psychiatry regularly for delusional thoughts, etc.        Objective:    Physical Exam  Constitutional: She appears well-developed and well-nourished. No distress.  HENT:  Nose: Nose normal.  Mouth/Throat: Oropharynx is clear and moist.  Neck: Neck supple. No thyromegaly present.  Cardiovascular: Normal rate and regular rhythm.  Pulmonary/Chest: Breath sounds normal. No respiratory distress. She has no wheezes.  Abdominal: Soft. Bowel sounds are normal. There is no tenderness.  Musculoskeletal: She exhibits no edema or tenderness.  Lymphadenopathy:    She has no cervical adenopathy.  Skin: No rash noted. No erythema.  Psychiatric: She has a normal mood and affect.    BP 120/70 (BP Location: Left Arm, Patient Position: Sitting, Cuff Size: Normal)   Pulse 70   Temp 97.9 F (36.6 C) (Oral)   Resp 16   Wt 117 lb 9.6 oz (53.3 kg)   LMP 01/29/1983   SpO2 97%   BMI 20.19 kg/m  Wt Readings from Last 3 Encounters:  11/08/18 117 lb 9.6 oz (53.3 kg)  09/28/18 116 lb (52.6 kg)  08/25/18 118 lb 8 oz (53.8 kg)     Lab Results  Component Value Date   WBC 6.3 07/28/2018   HGB 13.8 07/28/2018   HCT 40.6 07/28/2018   PLT 370 07/28/2018   GLUCOSE 91 08/03/2018   CHOL 206 (H) 07/28/2018   TRIG 68 07/28/2018   HDL 58 07/28/2018   LDLCALC 134 (H) 07/28/2018   ALT 14 07/28/2018   AST 29 07/28/2018   NA 143 08/03/2018   K 4.8 08/03/2018   CL 105 08/03/2018   CREATININE 0.77 08/03/2018   BUN 24 (H) 08/03/2018   CO2 32  08/03/2018   TSH 2.562 07/29/2018   HGBA1C 4.9 07/28/2018    Mr Brain W Wo Contrast  Result Date: 07/29/2018 CLINICAL DATA:  Acute presentation with psychosis. EXAM: MRI HEAD WITHOUT AND WITH CONTRAST TECHNIQUE: Multiplanar, multiecho pulse sequences of the brain and surrounding structures were obtained without and with intravenous contrast. CONTRAST:  51m MULTIHANCE  GADOBENATE DIMEGLUMINE 529 MG/ML IV SOLN COMPARISON:  CT 12/20/2017.  MRI 09/28/2006. FINDINGS: Brain: Mild age related volume loss. No focal abnormality of the brainstem or cerebellum. Cerebral hemispheres show a few scattered punctate foci of T2 and FLAIR signal within the white matter consistent with mild chronic small vessel change, less than often seen in healthy individuals of this age. No evidence of mass lesion, hemorrhage, hydrocephalus or extra-axial collection. After contrast administration, no abnormal enhancement occurs. Vascular: Major vessels at the base of the brain show flow. Skull and upper cervical spine: Negative Sinuses/Orbits: Clear/normal Other: None IMPRESSION: No acute or reversible finding. Mild age related volume loss. Minimal chronic small vessel change of the cerebral hemispheric white matter, less than often seen in persons of this age. Electronically Signed   By: Nelson Chimes M.D.   On: 07/29/2018 15:25       Assessment & Plan:   Problem List Items Addressed This Visit    Atrial tachycardia ([place])    S/p previous ablation.  Seeing cardiology for paroxysmal SVT.  Currently stable.        Bronchiectasis ([place])    Previously followed by Dr Madalyn Rob.  Breathing stable.  Follow.        Delusional disorder [facility])    Being followed by psychiatry.  Notes reviewed.  Per Dr Waylan Boga report - improved.  Currently receiving invega sustena injections.  Discussed importance of continuing f/u with psychiatry.        Pure hypercholesterolemia    On crestor.  Low cholesterol diet and exercise.  Follow lipid panel and  liver function tests.        Relevant Orders   Hepatic function panel   Lipid panel   Basic metabolic panel   Schizoaffective disorder [facility])    Recently admitted to inpatient psychiatry and diagnosed with schizoaffective disorder, bipolar type as well as delusional disorder.  Seeing psychiatry.  Receiving invega sustena injections.  Continue f/u with psychiatry.  Discussed with her today regarding the need to f/u with neurology.         Other Visit Diagnoses    Lymphopenia    -  Primary   Found on recent cbc check.  Recheck cbc wiht next labs.     Relevant Orders   CBC with Differential/Platelet   Encounter for immunization       Relevant Orders   Flu vaccine HIGH DOSE PF (Completed)       Einar Pheasant, MD

## 2018-11-09 ENCOUNTER — Encounter: Payer: Self-pay | Admitting: Internal Medicine

## 2018-11-09 NOTE — Assessment & Plan Note (Signed)
Being followed by psychiatry.  Notes reviewed.  Per Dr Waylan Boga report - improved.  Currently receiving invega sustena injections.  Discussed importance of continuing f/u with psychiatry.

## 2018-11-09 NOTE — Assessment & Plan Note (Signed)
On crestor.  Low cholesterol diet and exercise.  Follow lipid panel and liver function tests.   

## 2018-11-09 NOTE — Assessment & Plan Note (Signed)
Recently admitted to inpatient psychiatry and diagnosed with schizoaffective disorder, bipolar type as well as delusional disorder.  Seeing psychiatry.  Receiving invega sustena injections.  Continue f/u with psychiatry.  Discussed with her today regarding the need to f/u with neurology.

## 2018-11-09 NOTE — Assessment & Plan Note (Signed)
Previously followed by Dr Kussin.  Breathing stable.  Follow.   

## 2018-11-09 NOTE — Assessment & Plan Note (Signed)
S/p previous ablation.  Seeing cardiology for paroxysmal SVT.  Currently stable.

## 2018-11-13 DIAGNOSIS — F250 Schizoaffective disorder, bipolar type: Secondary | ICD-10-CM | POA: Diagnosis not present

## 2018-11-13 DIAGNOSIS — F22 Delusional disorders: Secondary | ICD-10-CM | POA: Diagnosis not present

## 2018-11-30 ENCOUNTER — Encounter: Payer: Self-pay | Admitting: Cardiovascular Disease

## 2018-11-30 ENCOUNTER — Ambulatory Visit (INDEPENDENT_AMBULATORY_CARE_PROVIDER_SITE_OTHER): Payer: Medicare Other | Admitting: Cardiovascular Disease

## 2018-11-30 VITALS — BP 102/62 | HR 64 | Ht 64.5 in | Wt 117.2 lb

## 2018-11-30 DIAGNOSIS — I471 Supraventricular tachycardia: Secondary | ICD-10-CM | POA: Diagnosis not present

## 2018-11-30 DIAGNOSIS — E785 Hyperlipidemia, unspecified: Secondary | ICD-10-CM

## 2018-11-30 NOTE — Patient Instructions (Signed)
Medication Instructions:  No changes If you need a refill on your cardiac medications before your next appointment, please call your pharmacy.   Lab work: None ordered  Testing/Procedures: None ordered  Follow-Up: At [facility], you and your health needs are our priority.  As part of our continuing mission to provide you with exceptional heart care, we have created designated Provider Care Teams.  These Care Teams include your primary Cardiologist (physician) and Advanced Practice Providers (APPs -  Physician Assistants and Nurse Practitioners) who all work together to provide you with the care you need, when you need it. You will need a follow up appointment in 6 months.  Please call our office 2 months in advance to schedule this appointment.  You may see Dr. Fletcher Anon or one of the following Advanced Practice Providers on your designated Care Team:   Murray Hodgkins, NP Christell Faith, PA-C

## 2018-11-30 NOTE — Progress Notes (Signed)
Cardiology Office Note   Date:  11/30/2018   ID:  Jody Howard, DOB Mar 03, 1941, MRN [id number]  PCP:  Einar Pheasant, MD  Cardiologist:   Kathlyn Sacramento, MD   Chief Complaint  Patient presents with  . Other    2 month follow up from ECHO and ZIO. Patient denies chest pain and SOB. Meds reviewed verbally with patient.       History of Present Illness: Jody Howard is a 76 y.o. female who is here today for follow-up visit regarding paroxysmal supraventricular tachycardia.    She has known history of supraventricular tachycardia status post ablation more than 10 years ago at [facility].  No history of ischemic heart disease or heart failure.  She has been overall healthy throughout her life.  She does have hyperlipidemia and takes rosuvastatin.  Family history is negative for premature coronary artery disease, arrhythmia or sudden death.  There is no history of tobacco use or alcohol use.  She consumes very little amount of caffeine. She was seen 2 months ago with intermittent episodes of tachycardia in the setting of increased stress and delusions  which required hospitalization.  Her husband died few years ago from cancer.  She reports significant difficulties at end of life especially after he had his frontal lobe removed.  While hospitalized recently at the psychiatric unit, she was noted to have a heart rate between 140 to 150 bpm.  She was prescribed propranolol to be used as needed but her blood pressure tends to run on the low side she did not take the medication.   She reported that a lot of her symptoms at that time were triggered by Depakote.  I proceeded with an echocardiogram which was done in October.  This showed an EF of 55 to 60% with mild to moderate mitral regurgitation and no other valvular abnormalities. 2-week outpatient monitor showed normal sinus rhythm with an average heart rate of 104 bpm with frequent episodes of supraventricular  tachycardia.  The longest episode lasted about 1 hours with a rate of 161 bpm.  I suggested trying small dose metoprolol but she reported improvement in symptoms without intervention.  She is doing well at the present time with no chest pain, shortness of breath or palpitations.  Past Medical History:  Diagnosis Date  . Anemia   . Atrial tachycardia (HCC)    s/p ablation  . Clostridium difficile infection 1997   required hospitalization  . Family history of breast cancer   . Hyperlipidemia   . Mitral valve prolapse   . Paroxysmal supraventricular tachycardia ([place])   . Rheumatic fever     Past Surgical History:  Procedure Laterality Date  . ABLATION     for atrial tachycardia  . BREAST BIOPSY  1997  . TONSILLECTOMY  1944     Current Outpatient Medications  Medication Sig Dispense Refill  . CALCIUM PO Take 1 tablet by mouth daily.     . Cholecalciferol (VITAMIN D-3 PO) Take 1 capsule by mouth daily. Taking 2000 IU daily    . CRESTOR 5 MG tablet TAKE 1/2 TABLET(2.5 MG) BY MOUTH DAILY 30 tablet 5  . Multiple Vitamins-Minerals (MULTIVITAMIN PO) Take 1 tablet by mouth daily.     . Paliperidone Palmitate ER (INVEGA SUSTENNA) 117 MG/0.75ML SUSY Inject 117 mg into the muscle every 28 (twenty-eight) days. Injection due 09/06/18 1 Syringe 0   No current facility-administered medications for this visit.     Allergies:  Codeine; Lipitor [atorvastatin]; Simvastatin; Other; Silicone; and Flagyl [metronidazole]    Social History:  The patient  reports that she has never smoked. She has never used smokeless tobacco. She reports current alcohol use. She reports that she does not use drugs.   Family History:  The patient's family history includes Breast cancer in her cousin and maternal aunt; Breast cancer (age of onset: 58) in her sister; Breast cancer (age of onset: 63) in her sister; Breast cancer (age of onset: 69) in her mother; Cancer in her paternal aunt; Heart attack in her sister;  Multiple myeloma in her father.    ROS:  Please see the history of present illness.   Otherwise, review of systems are positive for none.   All other systems are reviewed and negative.    PHYSICAL EXAM: VS:  BP 102/62 (BP Location: Left Arm, Patient Position: Sitting, Cuff Size: Normal)   Pulse 64   Ht 5' 4.5" (1.638 m)   Wt 117 lb 4 oz (53.2 kg)   LMP 01/29/1983   BMI 19.82 kg/m  , BMI Body mass index is 19.82 kg/m. GEN: Well nourished, well developed, in no acute distress  HEENT: normal  Neck: no JVD, carotid bruits, or masses Cardiac: RRR; no murmurs, rubs, or gallops,no edema  Respiratory:  clear to auscultation bilaterally, normal work of breathing GI: soft, nontender, nondistended, + BS MS: no deformity or atrophy  Skin: warm and dry, no rash Neuro:  Strength and sensation are intact Psych: euthymic mood, full affect   EKG:  EKG is ordered today. The ekg ordered today demonstrates normal sinus rhythm with no significant ST or T wave changes.  Heart rate is 64 bpm.   Recent Labs: 07/28/2018: ALT 14; Hemoglobin 13.8; Platelets 370 07/29/2018: TSH 2.562 08/03/2018: BUN 24; Creatinine, Ser 0.77; Potassium 4.8; Sodium 143    Lipid Panel    Component Value Date/Time   CHOL 206 (H) 07/28/2018 1549   TRIG 68 07/28/2018 1549   HDL 58 07/28/2018 1549   CHOLHDL 3.6 07/28/2018 1549   VLDL 14 07/28/2018 1549   LDLCALC 134 (H) 07/28/2018 1549      Wt Readings from Last 3 Encounters:  11/30/18 117 lb 4 oz (53.2 kg)  11/08/18 117 lb 9.6 oz (53.3 kg)  09/28/18 116 lb (52.6 kg)     No flowsheet data found.    ASSESSMENT AND PLAN:  1.  Paroxysmal supraventricular tachycardia: The patient was noted recently to have frequent episodes of supraventricular tachycardia.  She reports that her symptoms have improved significantly recently without intervention.  Echocardiogram showed normal LV systolic function. Blood pressure and heart rate now is somewhat on the low side which  makes it difficult to start a beta-blocker or calcium channel blocker.  However, if she starts having recurrent palpitations, it might be reasonable to prescribe propranolol or small dose metoprolol to be used as needed.  2.  Hyperlipidemia: Currently on small dose rosuvastatin.    Disposition:   FU with me in 6 months  Signed,  Kathlyn Sacramento, MD  11/30/2018 2:11 PM    [facility]

## 2018-12-08 DIAGNOSIS — F22 Delusional disorders: Secondary | ICD-10-CM | POA: Diagnosis not present

## 2018-12-08 DIAGNOSIS — F250 Schizoaffective disorder, bipolar type: Secondary | ICD-10-CM | POA: Diagnosis not present

## 2018-12-12 DIAGNOSIS — Z01818 Encounter for other preprocedural examination: Secondary | ICD-10-CM | POA: Diagnosis not present

## 2019-01-11 ENCOUNTER — Other Ambulatory Visit (INDEPENDENT_AMBULATORY_CARE_PROVIDER_SITE_OTHER): Payer: Commercial Managed Care - HMO

## 2019-01-11 DIAGNOSIS — E7800 Pure hypercholesterolemia, unspecified: Secondary | ICD-10-CM

## 2019-01-11 DIAGNOSIS — D72810 Lymphocytopenia: Secondary | ICD-10-CM | POA: Diagnosis not present

## 2019-01-11 LAB — BASIC METABOLIC PANEL
BUN: 21 mg/dL (ref 6–23)
CO2: 33 mEq/L — ABNORMAL HIGH (ref 19–32)
Calcium: 9.7 mg/dL (ref 8.4–10.5)
Chloride: 104 mEq/L (ref 96–112)
Creatinine, Ser: 0.93 mg/dL (ref 0.40–1.20)
GFR: 58.42 mL/min — ABNORMAL LOW (ref >60.00)
Glucose, Bld: 84 mg/dL (ref 70–99)
Potassium: 4.1 mEq/L (ref 3.5–5.1)
SODIUM: 143 meq/L (ref 135–145)

## 2019-01-11 LAB — CBC WITH DIFFERENTIAL/PLATELET
Basophils Absolute: 0 10*3/uL (ref 0.0–0.1)
Basophils Relative: 0.8 % (ref 0.0–3.0)
Eosinophils Absolute: 0.1 10*3/uL (ref 0.0–0.7)
Eosinophils Relative: 2.3 % (ref 0.0–5.0)
HCT: 39.6 % (ref 36.0–46.0)
Hemoglobin: 13.1 g/dL (ref 12.0–15.0)
Lymphocytes Relative: 14 % (ref 12.0–46.0)
Lymphs Abs: 0.8 10*3/uL (ref 0.7–4.0)
MCHC: 33 g/dL (ref 30.0–36.0)
MCV: 93.7 fl (ref 78.0–100.0)
Monocytes Absolute: 0.5 10*3/uL (ref 0.1–1.0)
Monocytes Relative: 8.7 % (ref 3.0–12.0)
Neutro Abs: 4.2 10*3/uL (ref 1.4–7.7)
Neutrophils Relative %: 74.2 % (ref 43.0–77.0)
Platelets: 352 10*3/uL (ref 150.0–400.0)
RBC: 4.23 Mil/uL (ref 3.87–5.11)
RDW: 13.4 % (ref 11.5–15.5)
WBC: 5.7 10*3/uL (ref 4.0–10.5)

## 2019-01-11 LAB — HEPATIC FUNCTION PANEL
ALT: 7 U/L (ref 0–35)
AST: 14 U/L (ref 0–37)
Albumin: 4.2 g/dL (ref 3.5–5.2)
Alkaline Phosphatase: 77 U/L (ref 39–117)
Bilirubin, Direct: 0.1 mg/dL (ref 0.0–0.3)
TOTAL PROTEIN: 6.9 g/dL (ref 6.0–8.3)
Total Bilirubin: 0.5 mg/dL (ref 0.2–1.2)

## 2019-01-11 LAB — LIPID PANEL
Cholesterol: 206 mg/dL — ABNORMAL HIGH (ref 0–200)
HDL: 53.8 mg/dL (ref >39.00)
LDL Cholesterol: 136 mg/dL — ABNORMAL HIGH (ref 0–99)
NonHDL: 152.08
Total CHOL/HDL Ratio: 4
Triglycerides: 80 mg/dL (ref 0.0–149.0)
VLDL: 16 mg/dL (ref 0.0–40.0)

## 2019-01-12 ENCOUNTER — Other Ambulatory Visit: Payer: Self-pay

## 2019-01-12 DIAGNOSIS — Z760 Encounter for issue of repeat prescription: Secondary | ICD-10-CM

## 2019-01-12 MED ORDER — ROSUVASTATIN CALCIUM 5 MG PO TABS: 5.0000 mg | ORAL_TABLET | Freq: Every day | ORAL | 5 refills | Status: DC

## 2019-01-18 DIAGNOSIS — H2513 Age-related nuclear cataract, bilateral: Secondary | ICD-10-CM | POA: Diagnosis not present

## 2019-01-18 DIAGNOSIS — H31012 Macula scars of posterior pole (postinflammatory) (post-traumatic), left eye: Secondary | ICD-10-CM | POA: Diagnosis not present

## 2019-01-29 ENCOUNTER — Ambulatory Visit: Payer: Self-pay | Admitting: *Deleted

## 2019-01-29 NOTE — Telephone Encounter (Signed)
FYI patient has been scheduled with you tomorrow

## 2019-01-29 NOTE — Telephone Encounter (Signed)
Patient has started walking and she feels her bladder in coming out of her vagina  Patient has never had GYN surgery possible cervix/uterus prolapse.She does not have GYN and will need referral. Patient reports no pain or trouble with urination. Reason for Disposition . [1] Something is hanging out of the vagina AND [2] can't easily be pushed back inside  Answer Assessment - Initial Assessment Questions 1. SYMPTOM: "What's the main symptom you're concerned about?" (e.g., pain, itching, dryness)     Possible prolapse 2. LOCATION: "Where is the protrution located?" (e.g., inside/outside, left/right)     Out of vagina 3. ONSET: "When did the prolapse  start?"     Several days ago 4. PAIN: "Is there any pain?" If so, ask: "How bad is it?" (Scale: 1-10; mild, moderate, severe)     No pain 5. ITCHING: "Is there any itching?" If so, ask: "How bad is it?" (Scale: 1-10; mild, moderate, severe)     no 6. CAUSE: "What do you think is causing the discharge?" "Have you had the same problem before? What happened then?"     No- new problem 7. OTHER SYMPTOMS: "Do you have any other symptoms?" (e.g., fever, itching, vaginal bleeding, pain with urination, injury to genital area, vaginal foreign body)     No other symptoms 8. PREGNANCY: "Is there any chance you are pregnant?" "When was your last menstrual period?"     n/a  Protocols used: VAGINAL [facility]

## 2019-01-30 ENCOUNTER — Ambulatory Visit: Payer: Self-pay | Admitting: Family Medicine

## 2019-01-30 ENCOUNTER — Telehealth: Payer: Self-pay

## 2019-01-30 NOTE — Telephone Encounter (Signed)
Copied from [place] [id number]. Topic: Quick Communication - Appointment Cancellation >> Jan 30, 2019 10:19 AM Margot Ables wrote: Patient called to cancel appointment scheduled for today at 2:20pm with Lauren. Patient has rescheduled their appointment for 02/01/2019  pt called to RS due to needing to help a friend today & tomorrow has to help handicap niece and afraid she would cause bladder/prolapse repair to "fall" - pt states she understands if she has to pay a no show fee but these friends are handicapped and need her assistance.   Route to department's PEC pool.

## 2019-02-01 ENCOUNTER — Encounter: Payer: Self-pay | Admitting: Family Medicine

## 2019-02-01 ENCOUNTER — Ambulatory Visit (INDEPENDENT_AMBULATORY_CARE_PROVIDER_SITE_OTHER): Payer: Medicare Other | Admitting: Family Medicine

## 2019-02-01 VITALS — BP 98/58 | HR 88 | Temp 98.0°F | Resp 18 | Ht 64.5 in | Wt 119.2 lb

## 2019-02-01 DIAGNOSIS — N819 Female genital prolapse, unspecified: Secondary | ICD-10-CM

## 2019-02-01 MED ORDER — DONUT PESSARY MISC: 0 refills | Status: DC

## 2019-02-01 NOTE — Progress Notes (Signed)
Subjective:    Patient ID: Jody Howard, female    DOB: 02/24/1941, 78 y.o.   MRN: [id number]  HPI   Patient presents to clinic due to suspected bladder or uterus pouching out of vaginal opening.  Patient has seen urogynecology at [facility] in January 2020; surgery was planned.  However, patient chose not to move forward with surgery due to not being emotionally ready.  Patient also states she has used pessary in the past, but found it to be uncomfortable.  States she thought her symptoms were improving, but she felt pouching out of her vaginal opening starting again last week, so became concerned.  Denies any burning, pressure or issues with urination.  Denies any problems with bowel movements, no diarrhea or very hard stools.  Patient Active Problem List   Diagnosis Date Noted  . Genetic testing 08/22/2018  . Schizoaffective disorder ([place]) 07/28/2018  . Mild dementia ([place]) 07/28/2018  . Delusional disorder ([place]) 07/28/2018  . Family history of breast cancer 10/24/2016  . Right hip pain 07/23/2015  . Stress 01/21/2015  . Health care maintenance 01/21/2015  . Female bladder prolapse 09/24/2014  . Vaginal bleeding 09/24/2014  . Abdominal pain, left lower quadrant 09/24/2014  . Left arm pain 04/07/2014  . Dysuria 06/14/2013  . Vaginal prolapse 06/06/2013  . Pure hypercholesterolemia 02/11/2013  . Bronchiectasis ([place]) 02/11/2013  . Atrial tachycardia ([place]) 01/30/2010   Social History   Tobacco Use  . Smoking status: Never Smoker  . Smokeless tobacco: Never Used  Substance Use Topics  . Alcohol use: Yes    Alcohol/week: 0.0 standard drinks    Comment: occasional   Review of Systems  Constitutional: Negative for chills, fatigue and fever.  HENT: Negative for congestion, ear pain, sinus pain and sore throat.   Eyes: Negative.   Respiratory: Negative for cough, shortness of breath and wheezing.   Cardiovascular: Negative for chest pain, palpitations and leg swelling.    Gastrointestinal: Negative for abdominal pain, diarrhea, nausea and vomiting.  Genitourinary: Negative for dysuria, frequency and urgency. +vaginal prolapse Musculoskeletal: Negative for arthralgias and myalgias.  Skin: Negative for color change, pallor and rash.  Neurological: Negative for syncope, light-headedness and headaches.  Psychiatric/Behavioral: The patient is not nervous/anxious.       Objective:   Physical Exam Vitals signs and nursing note reviewed.  Constitutional:      General: She is not in acute distress. HENT:     Head: Normocephalic and atraumatic.  Eyes:     General: No scleral icterus.    Extraocular Movements: Extraocular movements intact.     Conjunctiva/sclera: Conjunctivae normal.  Cardiovascular:     Rate and Rhythm: Normal rate and regular rhythm.  Pulmonary:     Effort: Pulmonary effort is normal. No respiratory distress.     Breath sounds: Normal breath sounds.  Abdominal:     General: Bowel sounds are normal. There is no distension.     Palpations: Abdomen is soft.     Tenderness: There is no abdominal tenderness. There is no guarding or rebound.     Hernia: There is no hernia in the right inguinal area or left inguinal area.  Genitourinary:    Exam position: Supine.     Labia:        Right: No rash, tenderness, lesion or injury.        Left: No rash, tenderness, lesion or injury.      Vagina: No vaginal discharge, erythema, tenderness or bleeding.  Uterus: With uterine prolapse.   Lymphadenopathy:     Lower Body: No right inguinal adenopathy. No left inguinal adenopathy.  Skin:    General: Skin is warm and dry.     Coloration: Skin is not pale.  Neurological:     Mental Status: She is alert and oriented to person, place, and time.  Psychiatric:        Mood and Affect: Mood is anxious (nervous about possibly needing surgery or having to use pessary).    Today's Vitals   02/01/19 0938  BP: (!) 98/58  Pulse: 88  Resp: 18  Temp: 98  F (36.7 C)  TempSrc: Oral  SpO2: 98%  Weight: 119 lb 3.2 oz (54.1 kg)  Height: 5' 4.5" (1.638 m)   Body mass index is 20.14 kg/m.     Assessment & Plan:    A total of 25  minutes were spent face-to-face with the patient during this encounter and over half of that time was spent on counseling and coordination of care. The patient was counseled on uterovaginal prolapse, treatment options, given reassurance.    Pelvic organ prolapse- patient does have uterovaginal prolapse.  Advised patient that she will need to follow-up with urogynecology specialist.  Patient given a prescription for pessary, however patient advised that I am unsure of exact sizing she would need.  Encourage patient to call her gynecologist for appointment/evaluation.  Patient is very nervous about having to use pessary and or need for surgery in future.  Patient reassured that having uterovaginal prolapse is common in something a lot of women experience, and using a pessary is a nonsurgical option, and often times when the uterovaginal prolapse is complete, surgery is needed to improve his symptoms.  Patient plans to call her uro-gynecologist.  Phone number and address of urogynecology office given to patient.

## 2019-02-01 NOTE — Patient Instructions (Addendum)
For pessary try:    [place]  Address: [address] [place], [place] [address] Phone: [phone]    Dr Sharlett Iles:   [facility]  [address]  [address]  [place], [place] [address]  [phone]     Pelvic Organ Prolapse Pelvic organ prolapse is the stretching, bulging, or dropping of pelvic organs into an abnormal position. It happens when the muscles and tissues that surround and support pelvic structures become weak or stretched. Pelvic organ prolapse can involve the:  Vagina (vaginal prolapse).  Uterus (uterine prolapse).  Bladder (cystocele).  Rectum (rectocele).  Intestines (enterocele). When organs other than the vagina are involved, they often bulge into the vagina or protrude from the vagina, depending on how severe the prolapse is. What are the causes? This condition may be caused by:  Pregnancy, labor, and childbirth.  Past pelvic surgery.  Decreased production of the hormone estrogen associated with menopause.  Consistently lifting more than 50 lb (23 kg).  Obesity.  Long-term inability to pass stool (chronic constipation).  A cough that lasts a long time (chronic).  Buildup of fluid in the abdomen due to certain diseases and other conditions. What are the signs or symptoms? Symptoms of this condition include:  Passing a little urine (loss of bladder control) when you cough, sneeze, strain, and exercise (stress incontinence). This may be worse immediately after childbirth. It may gradually improve over time.  Feeling pressure in your pelvis or vagina. This pressure may increase when you cough or when you are passing stool.  A bulge that protrudes from the opening of your vagina.  Difficulty passing urine or stool.  Pain in your lower back.  Pain, discomfort, or disinterest in sex.  Repeated bladder infections (urinary tract infections).  Difficulty inserting a tampon. In some people, this  condition causes no symptoms. How is this diagnosed? This condition may be diagnosed based on a vaginal and rectal exam. During the exam, you may be asked to cough and strain while you are lying down, sitting, and standing up. Your health care provider will determine if other tests are required, such as bladder function tests. How is this treated? Treatment for this condition may depend on your symptoms. Treatment may include:  Lifestyle changes, such as changes to your diet.  Emptying your bladder at scheduled times (bladder training therapy). This can help reduce or avoid urinary incontinence.  Estrogen. Estrogen may help mild prolapse by increasing the strength and tone of pelvic floor muscles.  Kegel exercises. These may help mild cases of prolapse by strengthening and tightening the muscles of the pelvic floor.  A soft, flexible device that helps support the vaginal walls and keep pelvic organs in place (pessary). This is inserted into your vagina by your health care provider.  Surgery. This is often the only form of treatment for severe prolapse. Follow these instructions at home:  Avoid drinking beverages that contain caffeine or alcohol.  Increase your intake of high-fiber foods. This can help decrease constipation and straining during bowel movements.  Lose weight if recommended by your health care provider.  Wear a sanitary pad or adult diapers if you have urinary incontinence.  Avoid heavy lifting and straining with exercise and work. Do not hold your breath when you perform mild to moderate lifting and exercise activities. Limit your activities as directed by your health care provider.  Do Kegel exercises as directed by your health care provider. To do this: ? Squeeze your pelvic  floor muscles tight. You should feel a tight lift in your rectal area and a tightness in your vaginal area. Keep your stomach, buttocks, and legs relaxed. ? Hold the muscles tight for up to 10  seconds. ? Relax your muscles. ? Repeat this exercise 50 times a day, or as many times as told by your health care provider. Continue to do this exercise for at least 4-6 weeks, or for as long as told by your health care provider.  Take over-the-counter and prescription medicines only as told by your health care provider.  If you have a pessary, take care of it as told by your health care provider.  Keep all follow-up visits as told by your health care provider. This is important. Contact a health care provider if you:  Have symptoms that interfere with your daily activities or sex life.  Need medicine to help with the discomfort.  Notice bleeding from your vagina that is not related to your period.  Have a fever.  Have pain or bleeding when you urinate.  Have bleeding when you pass stool.  Pass urine when you have sex.  Have chronic constipation.  Have a pessary that falls out.  Have bad smelling vaginal discharge.  Have an unusual, low pain in your abdomen. Summary  Pelvic organ prolapse is the stretching, bulging, or dropping of pelvic organs into an abnormal position. It happens when the muscles and tissues that surround and support pelvic structures become weak or stretched.  When organs other than the vagina are involved, they often bulge into the vagina or protrude from the vagina, depending on how severe the prolapse is.  In most cases, this condition needs to be treated only if it produces symptoms. Treatment may include lifestyle changes, estrogen, Kegel exercises, pessary insertion, or surgery.  Avoid heavy lifting and straining with exercise and work. Do not hold your breath when you perform mild to moderate lifting and exercise activities. Limit your activities as directed by your health care provider. This information is not intended to replace advice given to you by your health care provider. Make sure you discuss any questions you have with your health care  provider. Document Released: 06/19/2014 Document Revised: 12/14/2017 Document Reviewed: 12/14/2017 Elsevier Interactive Patient Education  2019 [employer].

## 2019-02-05 ENCOUNTER — Encounter: Payer: Self-pay | Admitting: Internal Medicine

## 2019-02-05 DIAGNOSIS — R2231 Localized swelling, mass and lump, right upper limb: Secondary | ICD-10-CM | POA: Diagnosis not present

## 2019-02-05 DIAGNOSIS — N6331 Unspecified lump in axillary tail of the right breast: Secondary | ICD-10-CM | POA: Diagnosis not present

## 2019-02-05 DIAGNOSIS — D1779 Benign lipomatous neoplasm of other sites: Secondary | ICD-10-CM | POA: Diagnosis not present

## 2019-02-05 DIAGNOSIS — Z803 Family history of malignant neoplasm of breast: Secondary | ICD-10-CM | POA: Diagnosis not present

## 2019-02-08 ENCOUNTER — Ambulatory Visit: Payer: Self-pay

## 2019-02-13 ENCOUNTER — Ambulatory Visit: Payer: Self-pay

## 2019-03-29 DIAGNOSIS — F250 Schizoaffective disorder, bipolar type: Secondary | ICD-10-CM | POA: Diagnosis not present

## 2019-03-29 DIAGNOSIS — F22 Delusional disorders: Secondary | ICD-10-CM | POA: Diagnosis not present

## 2019-05-16 ENCOUNTER — Encounter: Payer: Self-pay | Admitting: Internal Medicine

## 2019-05-16 ENCOUNTER — Ambulatory Visit (INDEPENDENT_AMBULATORY_CARE_PROVIDER_SITE_OTHER): Payer: Medicare Other | Admitting: Internal Medicine

## 2019-05-16 ENCOUNTER — Other Ambulatory Visit: Payer: Self-pay

## 2019-05-16 DIAGNOSIS — J479 Bronchiectasis, uncomplicated: Secondary | ICD-10-CM | POA: Diagnosis not present

## 2019-05-16 DIAGNOSIS — N8110 Cystocele, unspecified: Secondary | ICD-10-CM | POA: Diagnosis not present

## 2019-05-16 DIAGNOSIS — I471 Supraventricular tachycardia: Secondary | ICD-10-CM | POA: Diagnosis not present

## 2019-05-16 DIAGNOSIS — E7800 Pure hypercholesterolemia, unspecified: Secondary | ICD-10-CM | POA: Diagnosis not present

## 2019-05-16 DIAGNOSIS — F250 Schizoaffective disorder, bipolar type: Secondary | ICD-10-CM

## 2019-05-16 NOTE — Progress Notes (Signed)
Patient ID: Jody Howard, female   DOB: 09/13/1941, 77 y.o.   MRN: [id number]   Virtual Visit via telephone Note  This visit type was conducted due to national recommendations for restrictions regarding the COVID-19 pandemic (e.g. social distancing).  This format is felt to be most appropriate for this patient at this time.  All issues noted in this document were discussed and addressed.  No physical exam was performed (except for noted visual exam findings with Video Visits).   I connected with Jody Howard by telephone and verified that I am speaking with the correct person using two identifiers. Location patient: home Location provider: work Persons participating in the telephone visit: patient, provider  I discussed the limitations, risks, security and privacy concerns of performing an evaluation and management service by telephone and the availability of in person appointments.  The patient expressed understanding and agreed to proceed.   Reason for visit: scheduled follow up  HPI: States she is feeling better.  Had questions about vaginal prolapse.  Has seen Dr Weidner.  Discussed with her today.  Discussed further evaluation.  She declines.  Wants to monitor.  Stays active.  No chest pain.  No sob.  No acid reflux.  No abdominal pain.  Bowels moving.  No urine change.  Saw Dr Arida 11/2018 - f/u paroxysmal SVT.  Doing well.  No increased heart rate or palpitations.  Handling stress.  Was seeing psychiatry.   States feels better.     ROS: See pertinent positives and negatives per HPI.  Past Medical History:  Diagnosis Date  . Anemia   . Atrial tachycardia (HCC)    s/p ablation  . Clostridium difficile infection 1997   required hospitalization  . Family history of breast cancer   . Hyperlipidemia   . Mitral valve prolapse   . Paroxysmal supraventricular tachycardia (HCC)   . Rheumatic fever     Past Surgical History:  Procedure Laterality Date  . ABLATION     for  atrial tachycardia  . BREAST BIOPSY  1997  . TONSILLECTOMY  1944    Family History  Problem Relation Age of Onset  . Breast cancer Mother 88  . Cancer Paternal Aunt        colon cancer  . Multiple myeloma Father        dx in his 60s  . Breast cancer Sister 71       d. 76  . Heart attack Sister        due to iodine reaction  . Breast cancer Sister 72       d. 72  . Breast cancer Cousin        dx twice, first time possibly under 50; mat first cousin  . Breast cancer Maternal Aunt        dx in her 60s    SOCIAL HX: reviewed.    Current Outpatient Medications:  .  CALCIUM PO, Take 1 tablet by mouth daily. , Disp: , Rfl:  .  Cholecalciferol (VITAMIN D-3 PO), Take 1 capsule by mouth daily. Taking 2000 IU daily, Disp: , Rfl:  .  Misc. Devices (DONUT PESSARY) MISC, Use pessary to help treat pelvic organ prolapse, Disp: 1 each, Rfl: 0 .  Multiple Vitamins-Minerals (MULTIVITAMIN PO), Take 1 tablet by mouth daily. , Disp: , Rfl:  .  Paliperidone Palmitate ER (INVEGA SUSTENNA) 117 MG/0.75ML SUSY, Inject 117 mg into the muscle every 28 (twenty-eight) days. Injection due 09/06/18, Disp: 1 Syringe, Rfl: 0 .    rosuvastatin (CRESTOR) 5 MG tablet, Take 1 tablet (5 mg total) by mouth daily., Disp: 30 tablet, Rfl: 5  EXAM:  GENERAL: alert. Sounds to be in no acute distress.  Answering questions appropriately.    PSYCH/NEURO: pleasant and cooperative, no obvious depression or anxiety, speech and thought processing grossly intact  ASSESSMENT AND PLAN:  Discussed the following assessment and plan:  Schizoaffective disorder [facility]) Previously admitted to inpatient psychiatry and diagnosed with schizoaffective disorder, bipolar type as well as delusional disorder.  Was seeing psychiatry.  Discussed with her today.  States she is doing well.  Discuss.  Important to f/u.   Bronchiectasis Previously followed by Dr Madalyn Rob.  Breathing stable.  Follow.    Female bladder prolapse Evaluated by Dr  Sharlett Iles.  Discussed with her today.  seh declines further evaluation at this time.  Follow.    Pure hypercholesterolemia On crestor.  Low cholesterol diet and exercise.  Follow lipid panel and liver function tests.      I discussed the assessment and treatment plan with the patient. The patient was provided an opportunity to ask questions and all were answered. The patient agreed with the plan and demonstrated an understanding of the instructions.   The patient was advised to call back or seek an in-person evaluation if the symptoms worsen or if the condition fails to improve as anticipated.  I provided 23 minutes of non-face-to-face time during this encounter.   Einar Pheasant, MD

## 2019-05-18 ENCOUNTER — Telehealth: Payer: Self-pay

## 2019-05-18 NOTE — Telephone Encounter (Signed)
Spoke with patient to schedule F/U with Dr. Fletcher Anon. Patient declined stating she just had a F/U visit with her PCP, Dr. Nicki Reaper and has not been having any heart related problems. Recall deleted per patient request.

## 2019-05-20 ENCOUNTER — Encounter: Payer: Self-pay | Admitting: Internal Medicine

## 2019-05-20 NOTE — Assessment & Plan Note (Addendum)
Previously admitted to inpatient psychiatry and diagnosed with schizoaffective disorder, bipolar type as well as delusional disorder.  Was seeing psychiatry.  Discussed with her today.  States she is doing well.  Discuss.  Important to f/u.

## 2019-05-20 NOTE — Assessment & Plan Note (Signed)
Evaluated by Dr Sharlett Iles.  Discussed with her today.  seh declines further evaluation at this time.  Follow.

## 2019-05-20 NOTE — Assessment & Plan Note (Signed)
On crestor.  Low cholesterol diet and exercise.  Follow lipid panel and liver function tests.   

## 2019-05-20 NOTE — Assessment & Plan Note (Signed)
Previously followed by Dr Madalyn Rob.  Breathing stable.  Follow.

## 2019-08-06 DIAGNOSIS — Z23 Encounter for immunization: Secondary | ICD-10-CM | POA: Diagnosis not present

## 2019-08-28 ENCOUNTER — Telehealth: Payer: Self-pay | Admitting: *Deleted

## 2019-08-28 NOTE — Telephone Encounter (Signed)
Copied from [place] [id number]. Topic: General - Other >> Aug 28, 2019 10:40 AM Antonieta Iba C wrote: Reason for CRM: pt called in to schedule an visit. Pt says that she was around a friend that was sick. Pt says that she's not having any symptoms but would like to see provider to be advised. Pt says that she doesn't want to end up being as sick as her friend is.   Please assist pt .

## 2019-08-28 NOTE — Telephone Encounter (Signed)
Pt stated that she is feeling better after she ate and didn't want to make an appt at this time

## 2019-09-13 DIAGNOSIS — M7541 Impingement syndrome of right shoulder: Secondary | ICD-10-CM | POA: Diagnosis not present

## 2019-11-21 ENCOUNTER — Ambulatory Visit (INDEPENDENT_AMBULATORY_CARE_PROVIDER_SITE_OTHER): Payer: Medicare Other | Admitting: Internal Medicine

## 2019-11-21 ENCOUNTER — Other Ambulatory Visit: Payer: Self-pay

## 2019-11-21 VITALS — Ht 64.0 in | Wt 116.5 lb

## 2019-11-21 DIAGNOSIS — E7800 Pure hypercholesterolemia, unspecified: Secondary | ICD-10-CM | POA: Diagnosis not present

## 2019-11-21 DIAGNOSIS — M25559 Pain in unspecified hip: Secondary | ICD-10-CM

## 2019-11-21 DIAGNOSIS — E2839 Other primary ovarian failure: Secondary | ICD-10-CM

## 2019-11-21 DIAGNOSIS — N8110 Cystocele, unspecified: Secondary | ICD-10-CM | POA: Diagnosis not present

## 2019-11-21 DIAGNOSIS — I471 Supraventricular tachycardia: Secondary | ICD-10-CM

## 2019-11-21 DIAGNOSIS — J479 Bronchiectasis, uncomplicated: Secondary | ICD-10-CM

## 2019-11-21 DIAGNOSIS — F439 Reaction to severe stress, unspecified: Secondary | ICD-10-CM

## 2019-11-21 NOTE — Progress Notes (Signed)
Patient ID: Jody Howard, female   DOB: 25-Jan-1941, 78 y.o.   MRN: [id number]   Virtual Visit via video Note  This visit type was conducted due to national recommendations for restrictions regarding the COVID-19 pandemic (e.g. social distancing).  This format is felt to be most appropriate for this patient at this time.  All issues noted in this document were discussed and addressed.  No physical exam was performed (except for noted visual exam findings with Video Visits).   I connected with Jody Howard by a video enabled telemedicine application or telephone and verified that I am speaking with the correct person using two identifiers. Location patient: home Location provider: work  Persons participating in the virtual visit: patient, provider  I discussed the limitations, risks, security and privacy concerns of performing an evaluation and management service by telephone and the availability of in person appointments.  The patient expressed understanding and agreed to proceed.   Reason for visit: scheduled follow up.    HPI: She reports she feels good.  Staying in due to covid restrictions.  Handling stress well.  States appetite is good.  No chest pain.  No increased heart rate or palpitations.  Saw Dr Fletcher Anon previously for SVT.  States not an issue now.  No sob.  Stays active.  No acid reflux.  No abdominal pain.  Bowels moving.  Has noticed hips hurt at times. Notices more when sitting.  Once up and moving, does not bother her.  No back pain.  Discussed bone density.  Agreeable. Discussed previous problems with vaginal prolapse.  Not a significant issue for her now.  She desires no further intervention.     ROS: See pertinent positives and negatives per HPI.   Past Medical History:  Diagnosis Date  . Anemia   . Atrial tachycardia (HCC)    s/p ablation  . Clostridium difficile infection 1997   required hospitalization  . Family history of breast cancer   . Hyperlipidemia     . Mitral valve prolapse   . Paroxysmal supraventricular tachycardia ([place])   . Rheumatic fever     Past Surgical History:  Procedure Laterality Date  . ABLATION     for atrial tachycardia  . BREAST BIOPSY  1997  . TONSILLECTOMY  1944    Family History  Problem Relation Age of Onset  . Breast cancer Mother 41  . Cancer Paternal Aunt        colon cancer  . Multiple myeloma Father        dx in his 69s  . Breast cancer Sister 31       d. 19  . Heart attack Sister        due to iodine reaction  . Breast cancer Sister 83       d. 38  . Breast cancer Cousin        dx twice, first time possibly under 9; mat first cousin  . Breast cancer Maternal Aunt        dx in her 18s    SOCIAL HX: reviewed.    Current Outpatient Medications:  .  CALCIUM PO, Take 1 tablet by mouth daily. , Disp: , Rfl:  .  Cholecalciferol (VITAMIN D-3 PO), Take 1 capsule by mouth daily. Taking 2000 IU daily, Disp: , Rfl:  .  Misc. Devices (DONUT PESSARY) MISC, Use pessary to help treat pelvic organ prolapse, Disp: 1 each, Rfl: 0 .  Multiple Vitamins-Minerals (MULTIVITAMIN PO), Take 1 tablet by  mouth daily. , Disp: , Rfl:  .  rosuvastatin (CRESTOR) 5 MG tablet, Take 1 tablet (5 mg total) by mouth daily., Disp: 30 tablet, Rfl: 5  EXAM:  GENERAL: alert, oriented, appears well and in no acute distress  HEENT: atraumatic, conjunttiva clear, no obvious abnormalities on inspection of external nose and ears  NECK: normal movements of the head and neck  LUNGS: on inspection no signs of respiratory distress, breathing rate appears normal, no obvious gross SOB, gasping or wheezing  CV: no obvious cyanosis  MS: moves all visible extremities without noticeable abnormality  PSYCH/NEURO: pleasant and cooperative, no obvious depression or anxiety, speech and thought processing grossly intact  ASSESSMENT AND PLAN:  Discussed the following assessment and plan:  Vaginal prolapse Discussed.  Has seen Dr  Sharlett Iles previously.  Desires no further intervention.  Follow.   Stress Denies.  States she feels good.  Follow.    Pure hypercholesterolemia Crestor.  Follow lipid panel and liver function tests.    Paroxysmal SVT (supraventricular tachycardia) (HCC) S/p previous ablation. Saw Dr Fletcher Anon.  Currently doing well.  Follow.    Bronchiectasis Breathing stable.  Previously followed by Dr Madalyn Rob.   Hip pain Reports bilateral hip pain, intermittently.  Worse when sitting.  She desires no further intervention.  Stretches.  Exercise.  Follow.      I discussed the assessment and treatment plan with the patient. The patient was provided an opportunity to ask questions and all were answered. The patient agreed with the plan and demonstrated an understanding of the instructions.   The patient was advised to call back or seek an in-person evaluation if the symptoms worsen or if the condition fails to improve as anticipated.   Einar Pheasant, MD

## 2019-11-25 ENCOUNTER — Encounter: Payer: Self-pay | Admitting: Internal Medicine

## 2019-11-25 NOTE — Assessment & Plan Note (Signed)
S/p previous ablation. Saw Dr Fletcher Anon.  Currently doing well.  Follow.

## 2019-11-25 NOTE — Assessment & Plan Note (Signed)
Crestor.  Follow lipid panel and liver function tests.   

## 2019-11-25 NOTE — Assessment & Plan Note (Signed)
Denies.  States she feels good.  Follow.

## 2019-11-25 NOTE — Assessment & Plan Note (Signed)
Reports bilateral hip pain, intermittently.  Worse when sitting.  She desires no further intervention.  Stretches.  Exercise.  Follow.

## 2019-11-25 NOTE — Assessment & Plan Note (Signed)
Discussed.  Has seen Dr Sharlett Iles previously.  Desires no further intervention.  Follow.

## 2019-11-25 NOTE — Assessment & Plan Note (Signed)
Breathing stable.  Previously followed by Dr Madalyn Rob.

## 2019-12-11 ENCOUNTER — Other Ambulatory Visit: Payer: Self-pay

## 2019-12-12 ENCOUNTER — Other Ambulatory Visit: Payer: Self-pay

## 2019-12-12 ENCOUNTER — Other Ambulatory Visit (INDEPENDENT_AMBULATORY_CARE_PROVIDER_SITE_OTHER): Payer: Medicare Other

## 2019-12-12 ENCOUNTER — Encounter: Payer: Self-pay | Admitting: Internal Medicine

## 2019-12-12 DIAGNOSIS — I471 Supraventricular tachycardia: Secondary | ICD-10-CM

## 2019-12-12 DIAGNOSIS — E7800 Pure hypercholesterolemia, unspecified: Secondary | ICD-10-CM | POA: Diagnosis not present

## 2019-12-12 LAB — LIPID PANEL
Cholesterol: 231 mg/dL — ABNORMAL HIGH (ref 0–200)
HDL: 59.9 mg/dL (ref >39.00)
LDL Cholesterol: 145 mg/dL — ABNORMAL HIGH (ref 0–99)
NonHDL: 170.61
Total CHOL/HDL Ratio: 4
Triglycerides: 130 mg/dL (ref 0.0–149.0)
VLDL: 26 mg/dL (ref 0.0–40.0)

## 2019-12-12 LAB — HEPATIC FUNCTION PANEL
ALT: 8 U/L (ref 0–35)
AST: 17 U/L (ref 0–37)
Albumin: 4.2 g/dL (ref 3.5–5.2)
Alkaline Phosphatase: 72 U/L (ref 39–117)
Bilirubin, Direct: 0.1 mg/dL (ref 0.0–0.3)
Total Bilirubin: 0.4 mg/dL (ref 0.2–1.2)
Total Protein: 6.7 g/dL (ref 6.0–8.3)

## 2019-12-12 LAB — BASIC METABOLIC PANEL
BUN: 24 mg/dL — ABNORMAL HIGH (ref 6–23)
CO2: 32 mEq/L (ref 19–32)
Calcium: 9.9 mg/dL (ref 8.4–10.5)
Chloride: 104 mEq/L (ref 96–112)
Creatinine, Ser: 0.87 mg/dL (ref 0.40–1.20)
GFR: 62.95 mL/min (ref >60.00)
Glucose, Bld: 89 mg/dL (ref 70–99)
Potassium: 4.4 mEq/L (ref 3.5–5.1)
Sodium: 142 mEq/L (ref 135–145)

## 2019-12-12 LAB — TSH: TSH: 1.83 u[IU]/mL (ref 0.35–4.50)

## 2019-12-14 ENCOUNTER — Telehealth: Payer: Self-pay | Admitting: Lab

## 2019-12-14 ENCOUNTER — Other Ambulatory Visit: Payer: Self-pay | Admitting: Lab

## 2019-12-14 MED ORDER — ROSUVASTATIN CALCIUM 5 MG PO TABS: 5.0000 mg | ORAL_TABLET | Freq: Every day | ORAL | 5 refills | Status: DC

## 2019-12-15 ENCOUNTER — Other Ambulatory Visit: Payer: Self-pay | Admitting: Internal Medicine

## 2019-12-15 DIAGNOSIS — E7800 Pure hypercholesterolemia, unspecified: Secondary | ICD-10-CM

## 2019-12-15 NOTE — Progress Notes (Signed)
Order placed for f/u liver panel.  

## 2019-12-17 NOTE — Telephone Encounter (Signed)
Error

## 2020-01-02 ENCOUNTER — Ambulatory Visit: Payer: Self-pay

## 2020-01-07 ENCOUNTER — Ambulatory Visit
Admission: RE | Admit: 2020-01-07 | Discharge: 2020-01-07 | Disposition: A | Discharge status: Home / Self Care | Payer: Medicare Other | Source: Ambulatory Visit | Attending: Internal Medicine | Admitting: Internal Medicine

## 2020-01-07 DIAGNOSIS — Z780 Asymptomatic menopausal state: Secondary | ICD-10-CM | POA: Diagnosis not present

## 2020-01-07 DIAGNOSIS — E2839 Other primary ovarian failure: Secondary | ICD-10-CM | POA: Diagnosis not present

## 2020-01-07 DIAGNOSIS — M810 Age-related osteoporosis without current pathological fracture: Secondary | ICD-10-CM | POA: Diagnosis not present

## 2020-01-18 ENCOUNTER — Ambulatory Visit (INDEPENDENT_AMBULATORY_CARE_PROVIDER_SITE_OTHER): Payer: Medicare Other | Admitting: Internal Medicine

## 2020-01-18 ENCOUNTER — Other Ambulatory Visit: Payer: Self-pay

## 2020-01-18 DIAGNOSIS — M25559 Pain in unspecified hip: Secondary | ICD-10-CM

## 2020-01-18 DIAGNOSIS — M810 Age-related osteoporosis without current pathological fracture: Secondary | ICD-10-CM

## 2020-01-18 DIAGNOSIS — J479 Bronchiectasis, uncomplicated: Secondary | ICD-10-CM

## 2020-01-18 DIAGNOSIS — F439 Reaction to severe stress, unspecified: Secondary | ICD-10-CM

## 2020-01-18 DIAGNOSIS — F250 Schizoaffective disorder, bipolar type: Secondary | ICD-10-CM | POA: Diagnosis not present

## 2020-01-18 DIAGNOSIS — I471 Supraventricular tachycardia: Secondary | ICD-10-CM

## 2020-01-18 DIAGNOSIS — E7800 Pure hypercholesterolemia, unspecified: Secondary | ICD-10-CM | POA: Diagnosis not present

## 2020-01-18 MED ORDER — ALENDRONATE SODIUM 70 MG PO TABS: 70.0000 mg | ORAL_TABLET | ORAL | 5 refills | Status: DC

## 2020-01-18 NOTE — Progress Notes (Addendum)
Patient ID: Jody Howard, female   DOB: 16-May-1941, 79 y.o.   MRN: [id number]   Virtual Visit via video Note  This visit type was conducted due to national recommendations for restrictions regarding the COVID-19 pandemic (e.g. social distancing).  This format is felt to be most appropriate for this patient at this time.  All issues noted in this document were discussed and addressed.  No physical exam was performed (except for noted visual exam findings with Video Visits).   I connected with Fatin Swager by a video enabled telemedicine application and verified that I am speaking with the correct person using two identifiers. Location patient: home Location provider: work  Persons participating in the virtual visit: patient, provider  The limitations, risks, security and privacy concerns of performing an evaluation and management service by telephone and the availability of in person appointments have been discussed.  The patient expressed understanding and agreed to proceed.   Reason for visit: scheduled follow up.    HPI: Here for routine follow up.  She reports she is doing well.  Feels good.  Stays active.  No chest pain.  No sob.  No acid reflux.  No abdominal pain.  Bowels moving.  No increased heart rate or palpitations.  Occasional hip pain.  Does not limit her activity.  Nothing persistent.  Eating well.  Discussed taking crestor.  She has been off the medication. Recent cholesterol levels - increased.  Discussed the recommendation to restart the medication.  Discussed bone density results and evidence of osteoporosis.    Discussed treatment options.  Discussed fosamax and possible side effects.  Discussed weight bearing exercise, calcium and vitamin D.    ROS: See pertinent positives and negatives per HPI.  Past Medical History:  Diagnosis Date  . Anemia   . Atrial tachycardia (HCC)    s/p ablation  . Clostridium difficile infection 1997   required hospitalization  .  Family history of breast cancer   . Hyperlipidemia   . Mitral valve prolapse   . Paroxysmal supraventricular tachycardia ([place])   . Rheumatic fever     Past Surgical History:  Procedure Laterality Date  . ABLATION     for atrial tachycardia  . BREAST BIOPSY  1997  . TONSILLECTOMY  1944    Family History  Problem Relation Age of Onset  . Breast cancer Mother 33  . Cancer Paternal Aunt        colon cancer  . Multiple myeloma Father        dx in his 47s  . Breast cancer Sister 49       d. 70  . Heart attack Sister        due to iodine reaction  . Breast cancer Sister 47       d. 41  . Breast cancer Cousin        dx twice, first time possibly under 51; mat first cousin  . Breast cancer Maternal Aunt        dx in her 5s    SOCIAL HX: reviewed.    Current Outpatient Medications:  .  alendronate (FOSAMAX) 70 MG tablet, Take 1 tablet (70 mg total) by mouth every 7 (seven) days. Take with a full glass of water on an empty stomach.  Do not eat, drink, lie down or recline for at least one hour after taking., Disp: 4 tablet, Rfl: 5 .  CALCIUM PO, Take 1 tablet by mouth daily. , Disp: , Rfl:  .  Cholecalciferol (VITAMIN D-3 PO), Take 1 capsule by mouth daily. Taking 2000 IU daily, Disp: , Rfl:  .  Misc. Devices (DONUT PESSARY) MISC, Use pessary to help treat pelvic organ prolapse, Disp: 1 each, Rfl: 0 .  Multiple Vitamins-Minerals (MULTIVITAMIN PO), Take 1 tablet by mouth daily. , Disp: , Rfl:  .  rosuvastatin (CRESTOR) 5 MG tablet, Take 1 tablet (5 mg total) by mouth daily., Disp: 30 tablet, Rfl: 5  EXAM:  GENERAL: alert, oriented, appears well and in no acute distress  HEENT: atraumatic, conjunttiva clear, no obvious abnormalities on inspection of external nose and ears  NECK: normal movements of the head and neck  LUNGS: on inspection no signs of respiratory distress, breathing rate appears normal, no obvious gross SOB, gasping or wheezing  CV: no obvious  cyanosis  PSYCH/NEURO: pleasant and cooperative, no obvious depression or anxiety, speech and thought processing grossly intact  ASSESSMENT AND PLAN:  Discussed the following assessment and plan:  Bronchiectasis Breathing stable.  Previously evaluated by Dr Madalyn Rob.   Hip pain Not a significant issue for her.  Follow.    Osteoporosis Discussed treatment options.  Discussed bone density results and evidence of osteoporosis.   Agreed to take fosamax.  Discussed possible side effects of medication.  Continue calcium, vitamin D and weight bearing exercise.    Paroxysmal SVT (supraventricular tachycardia) (HCC) S/p previous ablation.  Saw Dr Fletcher Anon.  Doing well.  Follow.    Pure hypercholesterolemia Discussed restarting crestor.  Recent cholesterol levels increased. Follow lipid panel.    Schizoaffective disorder [facility]) Previous admitted to inpatient psychiatry.  Was seeing psychiatry.  Reports doing well.    Stress Feels like she is doing well.  Follow.    Meds ordered this encounter  Medications  . alendronate (FOSAMAX) 70 MG tablet    Sig: Take 1 tablet (70 mg total) by mouth every 7 (seven) days. Take with a full glass of water on an empty stomach.  Do not eat, drink, lie down or recline for at least one hour after taking.    Dispense:  4 tablet    Refill:  5     I discussed the assessment and treatment plan with the patient. The patient was provided an opportunity to ask questions and all were answered. The patient agreed with the plan and demonstrated an understanding of the instructions.   The patient was advised to call back or seek an in-person evaluation if the symptoms worsen or if the condition fails to improve as anticipated.   Einar Pheasant, MD

## 2020-01-20 ENCOUNTER — Encounter: Payer: Self-pay | Admitting: Internal Medicine

## 2020-01-20 DIAGNOSIS — M810 Age-related osteoporosis without current pathological fracture: Secondary | ICD-10-CM | POA: Insufficient documentation

## 2020-01-20 NOTE — Assessment & Plan Note (Signed)
Breathing stable.  Previously evaluated by Dr Madalyn Rob.

## 2020-01-20 NOTE — Assessment & Plan Note (Signed)
Not a significant issue for her.  Follow.

## 2020-01-20 NOTE — Assessment & Plan Note (Addendum)
Discussed restarting crestor.  Recent cholesterol levels increased. Follow lipid panel.

## 2020-01-20 NOTE — Assessment & Plan Note (Addendum)
Discussed treatment options.  Discussed bone density results and evidence of osteoporosis.   Agreed to take fosamax.  Discussed possible side effects of medication.  Continue calcium, vitamin D and weight bearing exercise.

## 2020-01-20 NOTE — Assessment & Plan Note (Signed)
Feels like she is doing well.  Follow.

## 2020-01-20 NOTE — Assessment & Plan Note (Signed)
S/p previous ablation.  Saw Dr Fletcher Anon.  Doing well.  Follow.

## 2020-01-20 NOTE — Assessment & Plan Note (Signed)
Previous admitted to inpatient psychiatry.  Was seeing psychiatry.  Reports doing well.

## 2020-01-21 DIAGNOSIS — H2513 Age-related nuclear cataract, bilateral: Secondary | ICD-10-CM | POA: Diagnosis not present

## 2020-01-22 DIAGNOSIS — D1801 Hemangioma of skin and subcutaneous tissue: Secondary | ICD-10-CM | POA: Diagnosis not present

## 2020-01-22 DIAGNOSIS — L853 Xerosis cutis: Secondary | ICD-10-CM | POA: Diagnosis not present

## 2020-01-22 DIAGNOSIS — D225 Melanocytic nevi of trunk: Secondary | ICD-10-CM | POA: Diagnosis not present

## 2020-01-22 DIAGNOSIS — L720 Epidermal cyst: Secondary | ICD-10-CM | POA: Diagnosis not present

## 2020-01-22 DIAGNOSIS — L821 Other seborrheic keratosis: Secondary | ICD-10-CM | POA: Diagnosis not present

## 2020-02-04 ENCOUNTER — Other Ambulatory Visit (INDEPENDENT_AMBULATORY_CARE_PROVIDER_SITE_OTHER): Payer: Medicare Other

## 2020-02-04 ENCOUNTER — Other Ambulatory Visit: Payer: Self-pay

## 2020-02-04 DIAGNOSIS — E7800 Pure hypercholesterolemia, unspecified: Secondary | ICD-10-CM

## 2020-02-04 LAB — HEPATIC FUNCTION PANEL
ALT: 9 U/L (ref 0–35)
AST: 18 U/L (ref 0–37)
Albumin: 4.1 g/dL (ref 3.5–5.2)
Alkaline Phosphatase: 73 U/L (ref 39–117)
Bilirubin, Direct: 0.1 mg/dL (ref 0.0–0.3)
Total Bilirubin: 0.5 mg/dL (ref 0.2–1.2)
Total Protein: 6.7 g/dL (ref 6.0–8.3)

## 2020-02-11 DIAGNOSIS — Z1231 Encounter for screening mammogram for malignant neoplasm of breast: Secondary | ICD-10-CM | POA: Diagnosis not present

## 2020-02-11 LAB — HM MAMMOGRAPHY

## 2020-05-14 ENCOUNTER — Ambulatory Visit: Payer: Medicare Other | Admitting: Internal Medicine

## 2020-06-17 ENCOUNTER — Other Ambulatory Visit: Payer: Self-pay | Admitting: Internal Medicine

## 2020-06-17 DIAGNOSIS — N819 Female genital prolapse, unspecified: Secondary | ICD-10-CM | POA: Diagnosis not present

## 2020-06-24 ENCOUNTER — Ambulatory Visit (INDEPENDENT_AMBULATORY_CARE_PROVIDER_SITE_OTHER): Payer: Medicare Other | Admitting: Internal Medicine

## 2020-06-24 ENCOUNTER — Encounter: Payer: Self-pay | Admitting: Internal Medicine

## 2020-06-24 ENCOUNTER — Other Ambulatory Visit: Payer: Self-pay

## 2020-06-24 DIAGNOSIS — E7800 Pure hypercholesterolemia, unspecified: Secondary | ICD-10-CM

## 2020-06-24 DIAGNOSIS — J479 Bronchiectasis, uncomplicated: Secondary | ICD-10-CM | POA: Diagnosis not present

## 2020-06-24 DIAGNOSIS — I471 Supraventricular tachycardia: Secondary | ICD-10-CM

## 2020-06-24 DIAGNOSIS — M810 Age-related osteoporosis without current pathological fracture: Secondary | ICD-10-CM

## 2020-06-24 DIAGNOSIS — F250 Schizoaffective disorder, bipolar type: Secondary | ICD-10-CM

## 2020-06-24 DIAGNOSIS — N8110 Cystocele, unspecified: Secondary | ICD-10-CM | POA: Diagnosis not present

## 2020-06-24 LAB — CBC WITH DIFFERENTIAL/PLATELET
Basophils Absolute: 0 10*3/uL (ref 0.0–0.1)
Basophils Relative: 0.7 % (ref 0.0–3.0)
Eosinophils Absolute: 0.1 10*3/uL (ref 0.0–0.7)
Eosinophils Relative: 2.6 % (ref 0.0–5.0)
HCT: 36.9 % (ref 36.0–46.0)
Hemoglobin: 12.3 g/dL (ref 12.0–15.0)
Lymphocytes Relative: 18.6 % (ref 12.0–46.0)
Lymphs Abs: 0.9 10*3/uL (ref 0.7–4.0)
MCHC: 33.3 g/dL (ref 30.0–36.0)
MCV: 93 fl (ref 78.0–100.0)
Monocytes Absolute: 0.5 10*3/uL (ref 0.1–1.0)
Monocytes Relative: 10.3 % (ref 3.0–12.0)
Neutro Abs: 3.3 10*3/uL (ref 1.4–7.7)
Neutrophils Relative %: 67.8 % (ref 43.0–77.0)
Platelets: 360 10*3/uL (ref 150.0–400.0)
RBC: 3.97 Mil/uL (ref 3.87–5.11)
RDW: 13.6 % (ref 11.5–15.5)
WBC: 4.9 10*3/uL (ref 4.0–10.5)

## 2020-06-24 LAB — VITAMIN D 25 HYDROXY (VIT D DEFICIENCY, FRACTURES): VITD: 76.83 ng/mL (ref 30.00–100.00)

## 2020-06-24 LAB — BASIC METABOLIC PANEL
BUN: 21 mg/dL (ref 6–23)
CO2: 31 mEq/L (ref 19–32)
Calcium: 9.5 mg/dL (ref 8.4–10.5)
Chloride: 102 mEq/L (ref 96–112)
Creatinine, Ser: 1.24 mg/dL — ABNORMAL HIGH (ref 0.40–1.20)
GFR: 41.76 mL/min — ABNORMAL LOW (ref >60.00)
Glucose, Bld: 72 mg/dL (ref 70–99)
Potassium: 4.7 mEq/L (ref 3.5–5.1)
Sodium: 139 mEq/L (ref 135–145)

## 2020-06-24 LAB — LIPID PANEL
Cholesterol: 195 mg/dL (ref 0–200)
HDL: 53.5 mg/dL (ref >39.00)
LDL Cholesterol: 124 mg/dL — ABNORMAL HIGH (ref 0–99)
NonHDL: 141.47
Total CHOL/HDL Ratio: 4
Triglycerides: 88 mg/dL (ref 0.0–149.0)
VLDL: 17.6 mg/dL (ref 0.0–40.0)

## 2020-06-24 LAB — HEPATIC FUNCTION PANEL
ALT: 9 U/L (ref 0–35)
AST: 16 U/L (ref 0–37)
Albumin: 4.1 g/dL (ref 3.5–5.2)
Alkaline Phosphatase: 64 U/L (ref 39–117)
Bilirubin, Direct: 0.1 mg/dL (ref 0.0–0.3)
Total Bilirubin: 0.4 mg/dL (ref 0.2–1.2)
Total Protein: 6.4 g/dL (ref 6.0–8.3)

## 2020-06-24 LAB — TSH: TSH: 1.03 u[IU]/mL (ref 0.35–4.50)

## 2020-06-24 NOTE — Progress Notes (Signed)
Patient ID: Jody Howard, female   DOB: 21-Sep-1941, 79 y.o.   MRN: [id number]   Subjective:    Patient ID: Jody Howard, female    DOB: Apr 09, 1941, 79 y.o.   MRN: [id number]  HPI This visit occurred during the SARS-CoV-2 public health emergency.  Safety protocols were in place, including screening questions prior to the visit, additional usage of staff PPE, and extensive cleaning of exam room while observing appropriate contact time as indicated for disinfecting solutions.  Patient here for a scheduled follow up.  She is doing well.  Feels good.  Stays active.  Exercises regularly.  No chest pain or sob.  No increased heart rate or palpitations.  No nausea or vomiting.  Bowels moving.  Stays active.  Evaluated by urogynecology 06/16/20.  Planning for gyn surgery (including hysterectomy).  Handling stress.  Overall feels good.     Past Medical History:  Diagnosis Date  . Anemia   . Atrial tachycardia (HCC)    s/p ablation  . Clostridium difficile infection 1997   required hospitalization  . Family history of breast cancer   . Hyperlipidemia   . Mitral valve prolapse   . Paroxysmal supraventricular tachycardia ([place])   . Rheumatic fever    Past Surgical History:  Procedure Laterality Date  . ABLATION     for atrial tachycardia  . BREAST BIOPSY  1997  . TONSILLECTOMY  1944   Family History  Problem Relation Age of Onset  . Breast cancer Mother 57  . Cancer Paternal Aunt        colon cancer  . Multiple myeloma Father        dx in his 74s  . Breast cancer Sister 4       d. 32  . Heart attack Sister        due to iodine reaction  . Breast cancer Sister 58       d. 23  . Breast cancer Cousin        dx twice, first time possibly under 66; mat first cousin  . Breast cancer Maternal Aunt        dx in her 52s   Social History   Socioeconomic History  . Marital status: Widowed    Spouse name: Not on file  . Number of children: 2  . Years of education: Not on  file  . Highest education level: Not on file  Occupational History  . Not on file  Tobacco Use  . Smoking status: Never Smoker  . Smokeless tobacco: Never Used  Vaping Use  . Vaping Use: Never used  Substance and Sexual Activity  . Alcohol use: Yes    Alcohol/week: 0.0 standard drinks    Comment: occasional  . Drug use: No  . Sexual activity: Not Currently  Other Topics Concern  . Not on file  Social History Narrative  . Not on file   Social Determinants of Health   Financial Resource Strain:   . Difficulty of Paying Living Expenses:   Food Insecurity:   . Worried About Charity fundraiser in the Last Year:   . Arboriculturist in the Last Year:   Transportation Needs:   . Film/video editor (Medical):   Marland Kitchen Lack of Transportation (Non-Medical):   Physical Activity:   . Days of Exercise per Week:   . Minutes of Exercise per Session:   Stress:   . Feeling of Stress :   Social Connections:   .  Frequency of Communication with Friends and Family:   . Frequency of Social Gatherings with Friends and Family:   . Attends Religious Services:   . Active Member of Clubs or Organizations:   . Attends Archivist Meetings:   Marland Kitchen Marital Status:     Outpatient Encounter Medications as of 06/24/2020  Medication Sig  . CALCIUM PO Take 1 tablet by mouth daily.   . Cholecalciferol (VITAMIN D-3 PO) Take 1 capsule by mouth daily. Taking 2000 IU daily  . Misc. Devices (DONUT PESSARY) MISC Use pessary to help treat pelvic organ prolapse  . Multiple Vitamins-Minerals (MULTIVITAMIN PO) Take 1 tablet by mouth daily.   . rosuvastatin (CRESTOR) 5 MG tablet Take 1 tablet (5 mg total) by mouth daily.  . [DISCONTINUED] alendronate (FOSAMAX) 70 MG tablet TAKE 1 TABLET EVERY 7 DAYS. TAKE WITH A FULL GLASS OF WATER ON AN EMPTY STOMACH. DO NOT EAT, DRINK, LIE DOWN OR RECLINE FOR AT LEAST ONE HOUR AFTER TAKING.   No facility-administered encounter medications on file as of 06/24/2020.     Review of Systems  Constitutional: Negative for appetite change and unexpected weight change.  HENT: Negative for congestion and sinus pressure.   Respiratory: Negative for cough, chest tightness and shortness of breath.   Cardiovascular: Negative for chest pain, palpitations and leg swelling.  Gastrointestinal: Negative for abdominal pain, diarrhea, nausea and vomiting.  Genitourinary: Negative for difficulty urinating and dysuria.  Musculoskeletal: Negative for joint swelling and myalgias.  Skin: Negative for color change and rash.  Neurological: Negative for dizziness, light-headedness and headaches.  Psychiatric/Behavioral: Negative for agitation and dysphoric mood.       Objective:    Physical Exam Vitals reviewed.  Constitutional:      General: She is not in acute distress.    Appearance: Normal appearance.  HENT:     Head: Normocephalic and atraumatic.     Right Ear: External ear normal.     Left Ear: External ear normal.  Eyes:     General: No scleral icterus.       Right eye: No discharge.        Left eye: No discharge.     Conjunctiva/sclera: Conjunctivae normal.  Neck:     Thyroid: No thyromegaly.  Cardiovascular:     Rate and Rhythm: Normal rate and regular rhythm.  Pulmonary:     Effort: No respiratory distress.     Breath sounds: Normal breath sounds. No wheezing.  Abdominal:     General: Bowel sounds are normal.     Palpations: Abdomen is soft.     Tenderness: There is no abdominal tenderness.  Musculoskeletal:        General: No swelling or tenderness.     Cervical back: Neck supple. No tenderness.  Lymphadenopathy:     Cervical: No cervical adenopathy.  Skin:    Findings: No erythema or rash.  Neurological:     Mental Status: She is alert.  Psychiatric:        Mood and Affect: Mood normal.        Behavior: Behavior normal.     BP 96/60   Pulse 100   Temp 97.6 F (36.4 C)   Resp 16   Ht 5' 4"  (1.626 m)   Wt 114 lb 12.8 oz (52.1 kg)    LMP 01/29/1983   SpO2 99%   BMI 19.71 kg/m  Wt Readings from Last 3 Encounters:  06/24/20 114 lb 12.8 oz (52.1 kg)  11/21/19 116  lb 8 oz (52.8 kg)  02/01/19 119 lb 3.2 oz (54.1 kg)     Lab Results  Component Value Date   WBC 4.9 06/24/2020   HGB 12.3 06/24/2020   HCT 36.9 06/24/2020   PLT 360.0 06/24/2020   GLUCOSE 72 06/24/2020   CHOL 195 06/24/2020   TRIG 88.0 06/24/2020   HDL 53.50 06/24/2020   LDLCALC 124 (H) 06/24/2020   ALT 9 06/24/2020   AST 16 06/24/2020   NA 139 06/24/2020   K 4.7 06/24/2020   CL 102 06/24/2020   CREATININE 1.24 (H) 06/24/2020   BUN 21 06/24/2020   CO2 31 06/24/2020   TSH 1.03 06/24/2020   HGBA1C 4.9 07/28/2018    DG Bone Density  Result Date: 01/07/2020 EXAM: DUAL X-RAY ABSORPTIOMETRY (DXA) FOR BONE MINERAL DENSITY IMPRESSION: Your patient Keyarah Cumpian completed a BMD test on 01/07/2020 using the [employer] (software version: 14.10) manufactured by [employer]. The following summarizes the results of our evaluation. Technologist: MTB PATIENT BIOGRAPHICAL: Name: Lilla, Callejo Patient ID: [id number] Birth Date: 01-19-1941 Height: 64.0 in. Gender: Female Exam Date: 01/07/2020 Weight: 116.5 lbs. Indications: Caucasian, Postmenopausal Fractures: Treatments: Calcium, Multi-Vitamin, Vitamin D DENSITOMETRY RESULTS: Site         Region     Measured Date Measured Age WHO Classification Young Adult T-score BMD         %Change vs. Previous Significant Change (*) AP Spine L1-L3 01/07/2020 78.2 Osteoporosis -2.8 0.839 g/cm2 - - DualFemur Total Left 01/07/2020 78.2 Osteoporosis -2.9 0.640 g/cm2 - - DualFemur Total Mean 01/07/2020 78.2 Osteoporosis -2.8 0.650 g/cm2 - - Left Forearm Radius 33% 01/07/2020 78.2 Osteoporosis -2.9 0.624 g/cm2 - - ASSESSMENT: The BMD measured at Femur Total Left is 0.640 g/cm2 with a T-score of -2.9. This patient is considered OSTEOPOROTIC according to [facility]) criteria. The scan quality is  good. L-4 was excluded due to degenerative changes. World Pharmacologist [facility]) criteria for post-menopausal, Caucasian Women: Normal:       T-score at or above -1 SD Osteopenia:   T-score between -1 and -2.5 SD Osteoporosis: T-score at or below -2.5 SD RECOMMENDATIONS: 1. All patients should optimize calcium and vitamin D intake. 2. Consider FDA-approved medical therapies in postmenopausal women and men aged 54 years and older, based on the following: a. A hip or vertebral(clinical or morphometric) fracture b. T-score < -2.5 at the femoral neck or spine after appropriate evaluation to exclude secondary causes c. Low bone mass (T-score between -1.0 and -2.5 at the femoral neck or spine) and a 10-year probability of a hip fracture > 3% or a 10-year probability of a major osteoporosis-related fracture > 20% based on the [place]-adapted WHO algorithm d. Clinician judgment and/or patient preferences may indicate treatment for people with 10-year fracture probabilities above or below these levels FOLLOW-UP: People with diagnosed cases of osteoporosis or at high risk for fracture should have regular bone mineral density tests. For patients eligible for Medicare, routine testing is allowed once every 2 years. The testing frequency can be increased to one year for patients who have rapidly progressing disease, those who are receiving or discontinuing medical therapy to restore bone mass, or have additional risk factors. I have reviewed this report, and agree with the above findings. Mark A. Thornton Papas, M.D. [facility] Electronically Signed   By: Lavonia Dana M.D.   On: 01/07/2020 11:23       Assessment & Plan:   Problem List Items  Addressed This Visit    Bronchiectasis [facility])    Previously evaluated by Dr Madalyn Rob.  Breathing stable. No sob.  No cough or congestion.       Osteoporosis    On fosamax.  Discussed taking weekly.        Relevant Orders   VITAMIN D 25 Hydroxy (Vit-D Deficiency, Fractures)  (Completed)   Paroxysmal SVT (supraventricular tachycardia) (HCC)    S/p previous ablation.  Sees Dr Fletcher Anon.  Stable.  Is active and denies any cardiac symptoms.        Pure hypercholesterolemia    On crestor.  Follow lipid panel and liver function tests.        Relevant Orders   CBC with Differential/Platelet (Completed)   Hepatic function panel (Completed)   TSH (Completed)   Lipid panel (Completed)   Basic metabolic panel (Completed)   Schizoaffective disorder ([place])    Was seeing psychiatry.  States she feels good.  Family supportive.  Follow.       Vaginal prolapse     Saw urgyn.  Planning for [facility].  See note.            Einar Pheasant, MD

## 2020-06-25 ENCOUNTER — Other Ambulatory Visit: Payer: Self-pay

## 2020-06-25 ENCOUNTER — Other Ambulatory Visit: Payer: Self-pay | Admitting: Internal Medicine

## 2020-06-25 DIAGNOSIS — R944 Abnormal results of kidney function studies: Secondary | ICD-10-CM

## 2020-06-25 NOTE — Progress Notes (Signed)
Order placed for f/u labs.  

## 2020-06-29 ENCOUNTER — Encounter: Payer: Self-pay | Admitting: Internal Medicine

## 2020-06-29 NOTE — Assessment & Plan Note (Signed)
On crestor.  Follow lipid panel and liver function tests.   

## 2020-06-29 NOTE — Assessment & Plan Note (Signed)
S/p previous ablation.  Sees Dr Fletcher Anon.  Stable.  Is active and denies any cardiac symptoms.

## 2020-06-29 NOTE — Assessment & Plan Note (Signed)
On fosamax.  Discussed taking weekly.

## 2020-06-29 NOTE — Assessment & Plan Note (Signed)
Saw urgyn.  Planning for [facility].  See note.

## 2020-06-29 NOTE — Assessment & Plan Note (Signed)
Was seeing psychiatry.  States she feels good.  Family supportive.  Follow.

## 2020-06-29 NOTE — Assessment & Plan Note (Signed)
Previously evaluated by Dr Madalyn Rob.  Breathing stable. No sob.  No cough or congestion.

## 2020-07-03 ENCOUNTER — Other Ambulatory Visit: Payer: Self-pay

## 2020-07-03 ENCOUNTER — Other Ambulatory Visit (INDEPENDENT_AMBULATORY_CARE_PROVIDER_SITE_OTHER): Payer: Medicare Other

## 2020-07-03 DIAGNOSIS — R944 Abnormal results of kidney function studies: Secondary | ICD-10-CM | POA: Diagnosis not present

## 2020-07-03 LAB — BASIC METABOLIC PANEL
BUN: 23 mg/dL (ref 6–23)
CO2: 34 mEq/L — ABNORMAL HIGH (ref 19–32)
Calcium: 9.7 mg/dL (ref 8.4–10.5)
Chloride: 105 mEq/L (ref 96–112)
Creatinine, Ser: 0.95 mg/dL (ref 0.40–1.20)
GFR: 56.79 mL/min — ABNORMAL LOW (ref >60.00)
Glucose, Bld: 74 mg/dL (ref 70–99)
Potassium: 4.4 mEq/L (ref 3.5–5.1)
Sodium: 142 mEq/L (ref 135–145)

## 2020-07-03 LAB — URINALYSIS, ROUTINE W REFLEX MICROSCOPIC
Bilirubin Urine: NEGATIVE
Hgb urine dipstick: NEGATIVE
Ketones, ur: NEGATIVE
Leukocytes,Ua: NEGATIVE
Nitrite: NEGATIVE
RBC / HPF: NONE SEEN (ref 0–?)
Specific Gravity, Urine: 1.025 (ref 1.000–1.030)
Total Protein, Urine: NEGATIVE
Urine Glucose: NEGATIVE
Urobilinogen, UA: 0.2 (ref 0.0–1.0)
pH: 5.5 (ref 5.0–8.0)

## 2020-07-04 ENCOUNTER — Telehealth: Payer: Self-pay

## 2020-07-04 NOTE — Telephone Encounter (Signed)
LMTCB in regards to lab results.  

## 2020-08-08 DIAGNOSIS — N812 Incomplete uterovaginal prolapse: Secondary | ICD-10-CM | POA: Diagnosis not present

## 2020-08-17 DIAGNOSIS — Z01818 Encounter for other preprocedural examination: Secondary | ICD-10-CM | POA: Diagnosis not present

## 2020-08-19 DIAGNOSIS — I471 Supraventricular tachycardia: Secondary | ICD-10-CM | POA: Diagnosis not present

## 2020-08-19 DIAGNOSIS — N814 Uterovaginal prolapse, unspecified: Secondary | ICD-10-CM | POA: Diagnosis not present

## 2020-08-19 DIAGNOSIS — R112 Nausea with vomiting, unspecified: Secondary | ICD-10-CM | POA: Diagnosis not present

## 2020-08-19 DIAGNOSIS — N812 Incomplete uterovaginal prolapse: Secondary | ICD-10-CM | POA: Diagnosis not present

## 2020-08-19 DIAGNOSIS — N393 Stress incontinence (female) (male): Secondary | ICD-10-CM | POA: Diagnosis not present

## 2020-08-19 DIAGNOSIS — Z7983 Long term (current) use of bisphosphonates: Secondary | ICD-10-CM | POA: Diagnosis not present

## 2020-08-19 DIAGNOSIS — N8110 Cystocele, unspecified: Secondary | ICD-10-CM | POA: Diagnosis not present

## 2020-08-19 DIAGNOSIS — E7800 Pure hypercholesterolemia, unspecified: Secondary | ICD-10-CM | POA: Diagnosis not present

## 2020-08-19 DIAGNOSIS — E785 Hyperlipidemia, unspecified: Secondary | ICD-10-CM | POA: Diagnosis not present

## 2020-08-20 DIAGNOSIS — N880 Leukoplakia of cervix uteri: Secondary | ICD-10-CM | POA: Diagnosis not present

## 2020-08-20 DIAGNOSIS — D250 Submucous leiomyoma of uterus: Secondary | ICD-10-CM | POA: Diagnosis not present

## 2020-08-20 DIAGNOSIS — N838 Other noninflammatory disorders of ovary, fallopian tube and broad ligament: Secondary | ICD-10-CM | POA: Diagnosis not present

## 2020-09-01 DIAGNOSIS — Z23 Encounter for immunization: Secondary | ICD-10-CM | POA: Diagnosis not present

## 2020-09-18 ENCOUNTER — Telehealth: Payer: Self-pay | Admitting: Internal Medicine

## 2020-09-18 NOTE — Telephone Encounter (Signed)
Margaretha Sheffield from [facility] [phone] called checking on cardio test form that was faxed on 09-11-20

## 2020-09-19 NOTE — Telephone Encounter (Signed)
Have not received any paperwork for this pt. Will call to follow up.

## 2020-09-25 NOTE — Telephone Encounter (Signed)
Patient stated she did not request this. Disregard.

## 2020-10-28 ENCOUNTER — Ambulatory Visit (INDEPENDENT_AMBULATORY_CARE_PROVIDER_SITE_OTHER): Payer: Medicare Other | Admitting: Internal Medicine

## 2020-10-28 ENCOUNTER — Other Ambulatory Visit: Payer: Self-pay

## 2020-10-28 ENCOUNTER — Encounter: Payer: Self-pay | Admitting: Internal Medicine

## 2020-10-28 ENCOUNTER — Other Ambulatory Visit: Payer: Self-pay | Admitting: Internal Medicine

## 2020-10-28 DIAGNOSIS — E7800 Pure hypercholesterolemia, unspecified: Secondary | ICD-10-CM | POA: Diagnosis not present

## 2020-10-28 DIAGNOSIS — M810 Age-related osteoporosis without current pathological fracture: Secondary | ICD-10-CM

## 2020-10-28 DIAGNOSIS — N8110 Cystocele, unspecified: Secondary | ICD-10-CM | POA: Diagnosis not present

## 2020-10-28 DIAGNOSIS — I471 Supraventricular tachycardia: Secondary | ICD-10-CM

## 2020-10-28 DIAGNOSIS — F250 Schizoaffective disorder, bipolar type: Secondary | ICD-10-CM | POA: Diagnosis not present

## 2020-10-28 DIAGNOSIS — J479 Bronchiectasis, uncomplicated: Secondary | ICD-10-CM | POA: Diagnosis not present

## 2020-10-28 DIAGNOSIS — Z0000 Encounter for general adult medical examination without abnormal findings: Secondary | ICD-10-CM

## 2020-10-28 MED ORDER — ALENDRONATE SODIUM 70 MG PO TABS: 70.0000 mg | ORAL_TABLET | ORAL | 6 refills | Status: AC

## 2020-10-28 NOTE — Patient Instructions (Signed)
Check to see if you have had a flu vaccine for this year.

## 2020-10-28 NOTE — Progress Notes (Signed)
Patient ID: Jody Howard, female   DOB: 10-06-1941, 79 y.o.   MRN: [id number]   Subjective:    Patient ID: Jody Howard, female    DOB: Apr 29, 1941, 79 y.o.   MRN: [id number]  HPI This visit occurred during the SARS-CoV-2 public health emergency.  Safety protocols were in place, including screening questions prior to the visit, additional usage of staff PPE, and extensive cleaning of exam room while observing appropriate contact time as indicated for disinfecting solutions.  Patient with past history of paroxysmal SVT, hypercholesterolemia and diagnosed with schizoaffective disorder. She comes in today to f/u on the above issues as well as for a complete physical exam.  She just recently had surgery - TVH, BSO with sling placement and cysto - on 08/19/20.  Did well with surgery.  Has felt good post op.  States feels better than she has in a long time.  Stays active.  No chest pain or sob reported.  No increased heart rate or palpitations.  No acid reflux, abdominal pain or cramping.  Bowels moving.  Handling stress.  Denies any problems.    Past Medical History:  Diagnosis Date  . Anemia   . Atrial tachycardia (HCC)    s/p ablation  . Clostridium difficile infection 1997   required hospitalization  . Family history of breast cancer   . Hyperlipidemia   . Mitral valve prolapse   . Paroxysmal supraventricular tachycardia ([place])   . Rheumatic fever    Past Surgical History:  Procedure Laterality Date  . ABLATION     for atrial tachycardia  . BREAST BIOPSY  1997  . TONSILLECTOMY  1944   Family History  Problem Relation Age of Onset  . Breast cancer Mother 44  . Cancer Paternal Aunt        colon cancer  . Multiple myeloma Father        dx in his 28s  . Breast cancer Sister 49       d. 5  . Heart attack Sister        due to iodine reaction  . Breast cancer Sister 68       d. 15  . Breast cancer Cousin        dx twice, first time possibly under 70; mat first cousin    . Breast cancer Maternal Aunt        dx in her 78s   Social History   Socioeconomic History  . Marital status: Widowed    Spouse name: Not on file  . Number of children: 2  . Years of education: Not on file  . Highest education level: Not on file  Occupational History  . Not on file  Tobacco Use  . Smoking status: Never Smoker  . Smokeless tobacco: Never Used  Vaping Use  . Vaping Use: Never used  Substance and Sexual Activity  . Alcohol use: Yes    Alcohol/week: 0.0 standard drinks    Comment: occasional  . Drug use: No  . Sexual activity: Not Currently  Other Topics Concern  . Not on file  Social History Narrative  . Not on file   Social Determinants of Health   Financial Resource Strain:   . Difficulty of Paying Living Expenses: Not on file  Food Insecurity:   . Worried About Charity fundraiser in the Last Year: Not on file  . Ran Out of Food in the Last Year: Not on file  Transportation Needs:   .  Lack of Transportation (Medical): Not on file  . Lack of Transportation (Non-Medical): Not on file  Physical Activity:   . Days of Exercise per Week: Not on file  . Minutes of Exercise per Session: Not on file  Stress:   . Feeling of Stress : Not on file  Social Connections:   . Frequency of Communication with Friends and Family: Not on file  . Frequency of Social Gatherings with Friends and Family: Not on file  . Attends Religious Services: Not on file  . Active Member of Clubs or Organizations: Not on file  . Attends Archivist Meetings: Not on file  . Marital Status: Not on file    Outpatient Encounter Medications as of 10/28/2020  Medication Sig  . alendronate (FOSAMAX) 70 MG tablet Take 1 tablet (70 mg total) by mouth every 7 (seven) days. Take with a full glass of water on an empty stomach.  Marland Kitchen CALCIUM PO Take 1 tablet by mouth daily.   . Cholecalciferol (VITAMIN D-3 PO) Take 1 capsule by mouth daily. Taking 2000 IU daily  . Misc. Devices  (DONUT PESSARY) MISC Use pessary to help treat pelvic organ prolapse  . Multiple Vitamins-Minerals (MULTIVITAMIN PO) Take 1 tablet by mouth daily.   . rosuvastatin (CRESTOR) 5 MG tablet TAKE 1 TABLET BY MOUTH EVERY DAY   No facility-administered encounter medications on file as of 10/28/2020.    Review of Systems  Constitutional: Negative for appetite change and unexpected weight change.  HENT: Negative for congestion, sinus pressure and sore throat.   Eyes: Negative for pain and visual disturbance.  Respiratory: Negative for cough, chest tightness and shortness of breath.   Cardiovascular: Negative for chest pain, palpitations and leg swelling.  Gastrointestinal: Negative for abdominal pain, constipation, diarrhea and vomiting.  Genitourinary: Negative for difficulty urinating and dysuria.  Musculoskeletal: Negative for joint swelling and myalgias.  Skin: Negative for color change and rash.  Neurological: Negative for dizziness, light-headedness and headaches.  Hematological: Negative for adenopathy. Does not bruise/bleed easily.  Psychiatric/Behavioral: Negative for agitation and dysphoric mood.       Objective:    Physical Exam Vitals reviewed.  Constitutional:      General: She is not in acute distress.    Appearance: Normal appearance. She is well-developed.  HENT:     Head: Normocephalic and atraumatic.     Right Ear: External ear normal.     Left Ear: External ear normal.  Eyes:     General: No scleral icterus.       Right eye: No discharge.        Left eye: No discharge.     Conjunctiva/sclera: Conjunctivae normal.  Neck:     Thyroid: No thyromegaly.  Cardiovascular:     Rate and Rhythm: Normal rate and regular rhythm.  Pulmonary:     Effort: No tachypnea, accessory muscle usage or respiratory distress.     Breath sounds: Normal breath sounds. No decreased breath sounds or wheezing.  Chest:     Breasts:        Right: No inverted nipple, mass, nipple discharge  or tenderness (no axillary adenopathy).        Left: No inverted nipple, mass, nipple discharge or tenderness (no axilarry adenopathy).  Abdominal:     General: Bowel sounds are normal.     Palpations: Abdomen is soft.     Tenderness: There is no abdominal tenderness.  Musculoskeletal:        General: No swelling or  tenderness.     Cervical back: Neck supple. No tenderness.  Lymphadenopathy:     Cervical: No cervical adenopathy.  Skin:    Findings: No erythema or rash.  Neurological:     Mental Status: She is alert and oriented to person, place, and time.  Psychiatric:        Mood and Affect: Mood normal.        Behavior: Behavior normal.     BP 112/70   Pulse 98   Temp 97.7 F (36.5 C)   Resp 16   Ht 5' 4"  (1.626 m)   Wt 118 lb 6.4 oz (53.7 kg)   LMP 01/29/1983   SpO2 98%   BMI 20.32 kg/m  Wt Readings from Last 3 Encounters:  10/28/20 118 lb 6.4 oz (53.7 kg)  06/24/20 114 lb 12.8 oz (52.1 kg)  11/21/19 116 lb 8 oz (52.8 kg)     Lab Results  Component Value Date   WBC 4.9 06/24/2020   HGB 12.3 06/24/2020   HCT 36.9 06/24/2020   PLT 360.0 06/24/2020   GLUCOSE 74 07/03/2020   CHOL 195 06/24/2020   TRIG 88.0 06/24/2020   HDL 53.50 06/24/2020   LDLCALC 124 (H) 06/24/2020   ALT 9 06/24/2020   AST 16 06/24/2020   NA 142 07/03/2020   K 4.4 07/03/2020   CL 105 07/03/2020   CREATININE 0.95 07/03/2020   BUN 23 07/03/2020   CO2 34 (H) 07/03/2020   TSH 1.03 06/24/2020   HGBA1C 4.9 07/28/2018    DG Bone Density  Result Date: 01/07/2020 EXAM: DUAL X-RAY ABSORPTIOMETRY (DXA) FOR BONE MINERAL DENSITY IMPRESSION: Your patient Brion Conery completed a BMD test on 01/07/2020 using the [employer] (software version: 14.10) manufactured by [employer]. The following summarizes the results of our evaluation. Technologist: MTB PATIENT BIOGRAPHICAL: Name: Benedicta, Sultan Patient ID: [id number] Birth Date: December 17, 1940 Height: 64.0 in. Gender: Female Exam  Date: 01/07/2020 Weight: 116.5 lbs. Indications: Caucasian, Postmenopausal Fractures: Treatments: Calcium, Multi-Vitamin, Vitamin D DENSITOMETRY RESULTS: Site         Region     Measured Date Measured Age WHO Classification Young Adult T-score BMD         %Change vs. Previous Significant Change (*) AP Spine L1-L3 01/07/2020 78.2 Osteoporosis -2.8 0.839 g/cm2 - - DualFemur Total Left 01/07/2020 78.2 Osteoporosis -2.9 0.640 g/cm2 - - DualFemur Total Mean 01/07/2020 78.2 Osteoporosis -2.8 0.650 g/cm2 - - Left Forearm Radius 33% 01/07/2020 78.2 Osteoporosis -2.9 0.624 g/cm2 - - ASSESSMENT: The BMD measured at Femur Total Left is 0.640 g/cm2 with a T-score of -2.9. This patient is considered OSTEOPOROTIC according to [facility]) criteria. The scan quality is good. L-4 was excluded due to degenerative changes. World Pharmacologist [facility]) criteria for post-menopausal, Caucasian Women: Normal:       T-score at or above -1 SD Osteopenia:   T-score between -1 and -2.5 SD Osteoporosis: T-score at or below -2.5 SD RECOMMENDATIONS: 1. All patients should optimize calcium and vitamin D intake. 2. Consider FDA-approved medical therapies in postmenopausal women and men aged 55 years and older, based on the following: a. A hip or vertebral(clinical or morphometric) fracture b. T-score < -2.5 at the femoral neck or spine after appropriate evaluation to exclude secondary causes c. Low bone mass (T-score between -1.0 and -2.5 at the femoral neck or spine) and a 10-year probability of a hip fracture > 3% or a 10-year probability of a major osteoporosis-related  fracture > 20% based on the [place]-adapted WHO algorithm d. Clinician judgment and/or patient preferences may indicate treatment for people with 10-year fracture probabilities above or below these levels FOLLOW-UP: People with diagnosed cases of osteoporosis or at high risk for fracture should have regular bone mineral density tests. For patients eligible for  Medicare, routine testing is allowed once every 2 years. The testing frequency can be increased to one year for patients who have rapidly progressing disease, those who are receiving or discontinuing medical therapy to restore bone mass, or have additional risk factors. I have reviewed this report, and agree with the above findings. Mark A. Thornton Papas, M.D. [facility] Electronically Signed   By: Lavonia Dana M.D.   On: 01/07/2020 11:23       Assessment & Plan:   Problem List Items Addressed This Visit    Schizoaffective disorder [facility])    Was seeing psychiatry.  On no medication now.  Family supportive.  Declines need for f/u.  Follow.       Pure hypercholesterolemia    Crestor.  Follow lipid panel and liver function tests.        Paroxysmal SVT (supraventricular tachycardia) (HCC)    S/p ablation.  Followed by Dr Fletcher Anon.  Currently doing well.  Follow.       Osteoporosis    She is supposed to be on fosamax. Not taking currently.  Discussed with her today.  Discussed the need to only take one time per week.  Directions written out for her.        Relevant Medications   alendronate (FOSAMAX) 70 MG tablet   Health care maintenance    Physical today 10/28/20.  Mammogram 02/12/20 - Birads I.  Colonoscopy overdue.  Agreeable for referral.       Female bladder prolapse    S/p surgery as outlined.  Doing well post op.  Feels better.  Follow.        Bronchiectasis ([place])    Previously evaluated by Dr Madalyn Rob.  Breathing stable.  No sob, cough or congestion.  Follow.           Einar Pheasant, MD

## 2020-10-28 NOTE — Assessment & Plan Note (Addendum)
Physical today 10/28/20.  Mammogram 02/12/20 - Birads I.  Colonoscopy overdue.  Agreeable for referral.

## 2020-11-01 ENCOUNTER — Encounter: Payer: Self-pay | Admitting: Internal Medicine

## 2020-11-01 ENCOUNTER — Telehealth: Payer: Self-pay | Admitting: Internal Medicine

## 2020-11-01 DIAGNOSIS — Z1211 Encounter for screening for malignant neoplasm of colon: Secondary | ICD-10-CM

## 2020-11-01 NOTE — Assessment & Plan Note (Signed)
She is supposed to be on fosamax. Not taking currently.  Discussed with her today.  Discussed the need to only take one time per week.  Directions written out for her.

## 2020-11-01 NOTE — Assessment & Plan Note (Signed)
Was seeing psychiatry.  On no medication now.  Family supportive.  Declines need for f/u.  Follow.

## 2020-11-01 NOTE — Assessment & Plan Note (Signed)
S/p surgery as outlined.  Doing well post op.  Feels better.  Follow.

## 2020-11-01 NOTE — Assessment & Plan Note (Signed)
Crestor.  Follow lipid panel and liver function tests.   

## 2020-11-01 NOTE — Assessment & Plan Note (Signed)
Previously evaluated by Dr Madalyn Rob.  Breathing stable.  No sob, cough or congestion.  Follow.

## 2020-11-01 NOTE — Assessment & Plan Note (Signed)
S/p ablation.  Followed by Dr Fletcher Anon.  Currently doing well.  Follow.

## 2020-11-01 NOTE — Telephone Encounter (Signed)
Had discussed GI referral with pt - for screening colonoscopy.  She previously saw Dr Evalee Mutton at [facility].  Does she want to f/u with her or does she prefer to stay in town.  Just let me know if has a preference of which GI MD she wants to see.

## 2020-11-03 NOTE — Addendum Note (Signed)
Addended by: Alisa Graff on: 11/03/2020 12:58 PM   Modules accepted: Orders

## 2020-11-03 NOTE — Telephone Encounter (Signed)
Patient is ok with staying in town. She does not have a preference because she is not familiar with any of the doctors.

## 2020-11-03 NOTE — Telephone Encounter (Signed)
Order placed for GI referral.   

## 2020-11-10 ENCOUNTER — Telehealth: Payer: Medicare Other

## 2020-12-04 ENCOUNTER — Other Ambulatory Visit: Payer: Self-pay | Admitting: Internal Medicine

## 2020-12-04 DIAGNOSIS — E7800 Pure hypercholesterolemia, unspecified: Secondary | ICD-10-CM

## 2020-12-04 NOTE — Progress Notes (Signed)
Orders placed for labs

## 2020-12-08 ENCOUNTER — Telehealth: Payer: Self-pay

## 2020-12-08 DIAGNOSIS — R001 Bradycardia, unspecified: Secondary | ICD-10-CM | POA: Diagnosis not present

## 2020-12-08 DIAGNOSIS — I959 Hypotension, unspecified: Secondary | ICD-10-CM | POA: Diagnosis not present

## 2020-12-08 DIAGNOSIS — R55 Syncope and collapse: Secondary | ICD-10-CM | POA: Diagnosis not present

## 2020-12-08 DIAGNOSIS — R110 Nausea: Secondary | ICD-10-CM | POA: Diagnosis not present

## 2020-12-08 DIAGNOSIS — R42 Dizziness and giddiness: Secondary | ICD-10-CM | POA: Diagnosis not present

## 2020-12-08 NOTE — Telephone Encounter (Signed)
Pt's daughter called and said her mom went to the [facility] in [place] because she passed out. The hospital told them she needs to follow up with a cardiologist because she had bradycardia and tachycardia. Lawson Fiscal said Dr. Lorin Picket can access this through [facility] records. She said her mother forgets a lot and she wanted Dr. Lorin Picket to be aware of this.

## 2020-12-08 NOTE — Telephone Encounter (Signed)
Records requested from [facility]

## 2020-12-09 ENCOUNTER — Other Ambulatory Visit: Payer: Medicare Other

## 2020-12-12 NOTE — Telephone Encounter (Signed)
Confirmed pt doing ok. Scheduled for follow up with Dr Nicki Reaper to discuss. Pt wanted to keep the appt for thursday

## 2020-12-18 ENCOUNTER — Other Ambulatory Visit: Payer: Medicare Other

## 2020-12-18 ENCOUNTER — Other Ambulatory Visit: Payer: Self-pay

## 2020-12-18 ENCOUNTER — Ambulatory Visit (INDEPENDENT_AMBULATORY_CARE_PROVIDER_SITE_OTHER): Payer: Medicare Other | Admitting: Internal Medicine

## 2020-12-18 ENCOUNTER — Telehealth: Payer: Self-pay

## 2020-12-18 VITALS — BP 120/78 | HR 65 | Temp 97.5°F | Resp 16 | Ht 64.0 in | Wt 119.4 lb

## 2020-12-18 DIAGNOSIS — F250 Schizoaffective disorder, bipolar type: Secondary | ICD-10-CM | POA: Diagnosis not present

## 2020-12-18 DIAGNOSIS — I471 Supraventricular tachycardia: Secondary | ICD-10-CM

## 2020-12-18 DIAGNOSIS — J479 Bronchiectasis, uncomplicated: Secondary | ICD-10-CM | POA: Diagnosis not present

## 2020-12-18 DIAGNOSIS — E7800 Pure hypercholesterolemia, unspecified: Secondary | ICD-10-CM | POA: Diagnosis not present

## 2020-12-18 DIAGNOSIS — F22 Delusional disorders: Secondary | ICD-10-CM | POA: Diagnosis not present

## 2020-12-18 LAB — BASIC METABOLIC PANEL
BUN: 14 mg/dL (ref 6–23)
CO2: 31 mEq/L (ref 19–32)
Calcium: 10.4 mg/dL (ref 8.4–10.5)
Chloride: 104 mEq/L (ref 96–112)
Creatinine, Ser: 0.96 mg/dL (ref 0.40–1.20)
GFR: 56.41 mL/min — ABNORMAL LOW (ref >60.00)
Glucose, Bld: 92 mg/dL (ref 70–99)
Potassium: 5.1 mEq/L (ref 3.5–5.1)
Sodium: 142 mEq/L (ref 135–145)

## 2020-12-18 LAB — CBC WITH DIFFERENTIAL/PLATELET
Basophils Absolute: 0.1 10*3/uL (ref 0.0–0.1)
Basophils Relative: 0.9 % (ref 0.0–3.0)
Eosinophils Absolute: 0.1 10*3/uL (ref 0.0–0.7)
Eosinophils Relative: 2.3 % (ref 0.0–5.0)
HCT: 39.3 % (ref 36.0–46.0)
Hemoglobin: 13 g/dL (ref 12.0–15.0)
Lymphocytes Relative: 17.9 % (ref 12.0–46.0)
Lymphs Abs: 1.1 10*3/uL (ref 0.7–4.0)
MCHC: 33.1 g/dL (ref 30.0–36.0)
MCV: 91.9 fl (ref 78.0–100.0)
Monocytes Absolute: 0.6 10*3/uL (ref 0.1–1.0)
Monocytes Relative: 8.6 % (ref 3.0–12.0)
Neutro Abs: 4.5 10*3/uL (ref 1.4–7.7)
Neutrophils Relative %: 70.3 % (ref 43.0–77.0)
Platelets: 356 10*3/uL (ref 150.0–400.0)
RBC: 4.27 Mil/uL (ref 3.87–5.11)
RDW: 13.4 % (ref 11.5–15.5)
WBC: 6.4 10*3/uL (ref 4.0–10.5)

## 2020-12-18 LAB — HEPATIC FUNCTION PANEL
ALT: 9 U/L (ref 0–35)
AST: 20 U/L (ref 0–37)
Albumin: 4.6 g/dL (ref 3.5–5.2)
Alkaline Phosphatase: 73 U/L (ref 39–117)
Bilirubin, Direct: 0.1 mg/dL (ref 0.0–0.3)
Total Bilirubin: 0.5 mg/dL (ref 0.2–1.2)
Total Protein: 7.1 g/dL (ref 6.0–8.3)

## 2020-12-18 LAB — TSH: TSH: 1.56 u[IU]/mL (ref 0.35–4.50)

## 2020-12-18 LAB — LIPID PANEL
Cholesterol: 182 mg/dL (ref 0–200)
HDL: 62.5 mg/dL (ref >39.00)
LDL Cholesterol: 93 mg/dL (ref 0–99)
NonHDL: 119.99
Total CHOL/HDL Ratio: 3
Triglycerides: 134 mg/dL (ref 0.0–149.0)
VLDL: 26.8 mg/dL (ref 0.0–40.0)

## 2020-12-18 NOTE — Progress Notes (Signed)
Patient ID: Delcenia Inman, female   DOB: 1941-06-18, 80 y.o.   MRN: [id number]   Subjective:    Patient ID: [name], female    DOB: Jul 31, 1941, 80 y.o.   MRN: [id number]  HPI This visit occurred during the SARS-CoV-2 public health emergency.  Safety protocols were in place, including screening questions prior to the visit, additional usage of staff PPE, and extensive cleaning of exam room while observing appropriate contact time as indicated for disinfecting solutions.  Patient here for a work in appt.  Work in with reports of recent visit to outside [place].  States increased heart rate.  Reports no significant w/up or abnormal findings.  Was released.  Reports staying active.  No chest pain.  No sob.  Cannot feel increased heart rate.  Eating.  No nausea or vomiting.  Bowels moving.  Overall feels good.  States feels better than has in a long time.     Past Medical History:  Diagnosis Date  . Anemia   . Atrial tachycardia (HCC)    s/p ablation  . Clostridium difficile infection 1997   required hospitalization  . Family history of breast cancer   . Hyperlipidemia   . Mitral valve prolapse   . Paroxysmal supraventricular tachycardia ([place])   . Rheumatic fever    Past Surgical History:  Procedure Laterality Date  . ABLATION     for atrial tachycardia  . BREAST BIOPSY  1997  . TONSILLECTOMY  1944   Family History  Problem Relation Age of Onset  . Breast cancer Mother 63  . Cancer Paternal Aunt        colon cancer  . Multiple myeloma Father        dx in his 34s  . Breast cancer Sister 28       d. 43  . Heart attack Sister        due to iodine reaction  . Breast cancer Sister 13       d. 9  . Breast cancer Cousin        dx twice, first time possibly under 5; mat first cousin  . Breast cancer Maternal Aunt        dx in her 2s   Social History   Socioeconomic History  . Marital status: Widowed    Spouse name: Not on file  . Number of children: 2  .  Years of education: Not on file  . Highest education level: Not on file  Occupational History  . Not on file  Tobacco Use  . Smoking status: Never Smoker  . Smokeless tobacco: Never Used  Vaping Use  . Vaping Use: Never used  Substance and Sexual Activity  . Alcohol use: Yes    Alcohol/week: 0.0 standard drinks    Comment: occasional  . Drug use: No  . Sexual activity: Not Currently  Other Topics Concern  . Not on file  Social History Narrative  . Not on file   Social Determinants of Health   Financial Resource Strain: Not on file  Food Insecurity: Not on file  Transportation Needs: Not on file  Physical Activity: Not on file  Stress: Not on file  Social Connections: Not on file    Outpatient Encounter Medications as of 12/18/2020  Medication Sig  . alendronate (FOSAMAX) 70 MG tablet Take 1 tablet (70 mg total) by mouth every 7 (seven) days. Take with a full glass of water on an empty stomach.  Marland Kitchen CALCIUM PO Take  1 tablet by mouth daily.   . Cholecalciferol (VITAMIN D-3 PO) Take 1 capsule by mouth daily. Taking 2000 IU daily  . Misc. Devices (DONUT PESSARY) MISC Use pessary to help treat pelvic organ prolapse  . Multiple Vitamins-Minerals (MULTIVITAMIN PO) Take 1 tablet by mouth daily.   . rosuvastatin (CRESTOR) 5 MG tablet TAKE 1 TABLET BY MOUTH EVERY DAY   No facility-administered encounter medications on file as of 12/18/2020.    Review of Systems  Constitutional: Negative for appetite change and unexpected weight change.  HENT: Negative for congestion and sinus pressure.   Respiratory: Negative for cough, chest tightness and shortness of breath.   Cardiovascular: Negative for chest pain, palpitations and leg swelling.  Gastrointestinal: Negative for abdominal pain, diarrhea, nausea and vomiting.  Genitourinary: Negative for difficulty urinating and dysuria.  Musculoskeletal: Negative for joint swelling and myalgias.  Skin: Negative for color change and rash.   Neurological: Negative for dizziness, light-headedness and headaches.  Psychiatric/Behavioral: Negative for agitation and dysphoric mood.       Objective:    Physical Exam Vitals reviewed.  Constitutional:      General: She is not in acute distress.    Appearance: Normal appearance.  HENT:     Head: Normocephalic and atraumatic.     Right Ear: External ear normal.     Left Ear: External ear normal.     Mouth/Throat:     Mouth: Oropharynx is clear and moist.  Eyes:     General: No scleral icterus.       Right eye: No discharge.        Left eye: No discharge.     Conjunctiva/sclera: Conjunctivae normal.  Neck:     Thyroid: No thyromegaly.  Cardiovascular:     Rate and Rhythm: Normal rate and regular rhythm.  Pulmonary:     Effort: No respiratory distress.     Breath sounds: Normal breath sounds. No wheezing.  Abdominal:     General: Bowel sounds are normal.     Palpations: Abdomen is soft.     Tenderness: There is no abdominal tenderness.  Musculoskeletal:        General: No swelling, tenderness or edema.     Cervical back: Neck supple. No tenderness.  Lymphadenopathy:     Cervical: No cervical adenopathy.  Skin:    Findings: No erythema or rash.  Neurological:     Mental Status: She is alert.  Psychiatric:        Mood and Affect: Mood normal.        Behavior: Behavior normal.     BP 120/78   Pulse 65   Temp (!) 97.5 F (36.4 C) (Oral)   Resp 16   Ht _0  (1.626 m)   Wt 119 lb 6.4 oz (54.2 kg)   LMP 01/29/1983   SpO2 99%   BMI 20.49 kg/m  Wt Readings from Last 3 Encounters:  12/18/20 119 lb 6.4 oz (54.2 kg)  10/28/20 118 lb 6.4 oz (53.7 kg)  06/24/20 114 lb 12.8 oz (52.1 kg)     Lab Results  Component Value Date   WBC 6.4 12/18/2020   HGB 13.0 12/18/2020   HCT 39.3 12/18/2020   PLT 356.0 12/18/2020   GLUCOSE 92 12/18/2020   CHOL 182 12/18/2020   TRIG 134.0 12/18/2020   HDL 62.50 12/18/2020   LDLCALC 93 12/18/2020   ALT 9 12/18/2020   AST  20 12/18/2020   NA 142 12/18/2020   K 5.1 12/18/2020  CL 104 12/18/2020   CREATININE 0.96 12/18/2020   BUN 14 12/18/2020   CO2 31 12/18/2020   TSH 1.56 12/18/2020   HGBA1C 4.9 07/28/2018    DG Bone Density  Result Date: 01/07/2020 EXAM: DUAL X-RAY ABSORPTIOMETRY (DXA) FOR BONE MINERAL DENSITY IMPRESSION: Your patient Nathalia Corona completed a BMD test on 01/07/2020 using the [place] (software version: 14.10) manufactured by [employer]. The following summarizes the results of our evaluation. Technologist: MTB PATIENT BIOGRAPHICAL: Name: Shalunda, Lindh Patient ID: [id number] Birth Date: 03-Apr-1941 Height: 64.0 in. Gender: Female Exam Date: 01/07/2020 Weight: 116.5 lbs. Indications: Caucasian, Postmenopausal Fractures: Treatments: Calcium, Multi-Vitamin, Vitamin D DENSITOMETRY RESULTS: Site         Region     Measured Date Measured Age WHO Classification Young Adult T-score BMD         %Change vs. Previous Significant Change (*) AP Spine L1-L3 01/07/2020 78.2 Osteoporosis -2.8 0.839 g/cm2 - - DualFemur Total Left 01/07/2020 78.2 Osteoporosis -2.9 0.640 g/cm2 - - DualFemur Total Mean 01/07/2020 78.2 Osteoporosis -2.8 0.650 g/cm2 - - Left Forearm Radius 33% 01/07/2020 78.2 Osteoporosis -2.9 0.624 g/cm2 - - ASSESSMENT: The BMD measured at Femur Total Left is 0.640 g/cm2 with a T-score of -2.9. This patient is considered OSTEOPOROTIC according to [facility]) criteria. The scan quality is good. L-4 was excluded due to degenerative changes. [facility]) criteria for post-menopausal, Caucasian Women: Normal:       T-score at or above -1 SD Osteopenia:   T-score between -1 and -2.5 SD Osteoporosis: T-score at or below -2.5 SD RECOMMENDATIONS: 1. All patients should optimize calcium and vitamin D intake. 2. Consider FDA-approved medical therapies in postmenopausal women and men aged 44 years and older, based on the following: a. A hip or  vertebral(clinical or morphometric) fracture b. T-score < -2.5 at the femoral neck or spine after appropriate evaluation to exclude secondary causes c. Low bone mass (T-score between -1.0 and -2.5 at the femoral neck or spine) and a 10-year probability of a hip fracture > 3% or a 10-year probability of a major osteoporosis-related fracture > 20% based on the [place]-adapted WHO algorithm d. Clinician judgment and/or patient preferences may indicate treatment for people with 10-year fracture probabilities above or below these levels FOLLOW-UP: People with diagnosed cases of osteoporosis or at high risk for fracture should have regular bone mineral density tests. For patients eligible for Medicare, routine testing is allowed once every 2 years. The testing frequency can be increased to one year for patients who have rapidly progressing disease, those who are receiving or discontinuing medical therapy to restore bone mass, or have additional risk factors. I have reviewed this report, and agree with the above findings. Mark A. Thornton Papas, M.D. [facility] Electronically Signed   By: Lavonia Dana M.D.   On: 01/07/2020 11:23       Assessment & Plan:   Problem List Items Addressed This Visit    Bronchiectasis [facility])    Previously evaluated by Dr Madalyn Rob.  Breathing doing well. No sob.  No cough or congestion.  Follow.         Delusional disorder [facility])    Was followed by psychiatry.  Was receiving invega sustena injections.  Off now.  Desires not to take medication.  Follow.        Paroxysmal SVT (supraventricular tachycardia) (HCC) - Primary    Previous ablation.  Has been seeing Dr Fletcher Anon.  Today episode  of SVT in office.  Responded to valsalva.  Back in SR.  Will check routine labs including metabolic panel and tsh.  Discussed with Dr Fletcher Anon.  Plan to place monitor - two week.  Discussed with Ms Collymore.  She is agreeable.  Cardiology to contact her to come in for placement.        Relevant Orders    EKG 12-Lead (Completed)   CBC with Differential/Platelet (Completed)   TSH (Completed)   Basic metabolic panel (Completed)   Pure hypercholesterolemia    Crestor.  Follow lipid panel and liver function tests.        Relevant Orders   Hepatic function panel (Completed)   Lipid panel (Completed)   Schizoaffective disorder ([place])    Was seeing psychiatry.  On no medication now.  Follow.           Einar Pheasant, MD

## 2020-12-18 NOTE — Telephone Encounter (Signed)
Ordered placed for 14 day zio.

## 2020-12-18 NOTE — Telephone Encounter (Signed)
-----   Message from Jody Hampshire, MD sent at 12/18/2020  9:31 AM EST ----- This patient was seen by me in 2019 for paroxysmal supraventricular tachycardia.  She is having more episodes of tachycardia and I want her to have a 2-week ZIO monitor and follow-up with me after.  Can you please arrange?  Thanks.

## 2020-12-19 ENCOUNTER — Other Ambulatory Visit: Payer: Self-pay | Admitting: Internal Medicine

## 2020-12-19 DIAGNOSIS — E875 Hyperkalemia: Secondary | ICD-10-CM

## 2020-12-19 NOTE — Telephone Encounter (Signed)
Noted. Will update Dr. Fletcher Anon and Dr. Nicki Reaper.

## 2020-12-19 NOTE — Telephone Encounter (Signed)
Called patient to get monitor placement appointment, patient states she has always had a fast heart rate and declined appointment.

## 2020-12-19 NOTE — Progress Notes (Signed)
Order placed for f/u potassium check.  

## 2020-12-19 NOTE — Telephone Encounter (Signed)
Ok

## 2020-12-19 NOTE — Telephone Encounter (Signed)
Called patient and she stated she is unsure about doing another monitor. Patient states she wants to speak with her son Dr. Pryor Ochoa before scheduling or agreeing to anything. Patient states she has dealt with heart issues her whole life and is feeling like she is doing fine now.

## 2020-12-20 NOTE — Telephone Encounter (Signed)
Called and spoke with Jody Howard's son, Jody Howard.  Discussed reason for holter, etc.  He has talked to his mother.  She is agreeable to holter.  They are going to contact Dr Tyrell Antonio office Monday.

## 2020-12-23 ENCOUNTER — Ambulatory Visit: Payer: Medicare Other

## 2020-12-23 DIAGNOSIS — I471 Supraventricular tachycardia: Secondary | ICD-10-CM

## 2020-12-24 ENCOUNTER — Ambulatory Visit (INDEPENDENT_AMBULATORY_CARE_PROVIDER_SITE_OTHER): Payer: Medicare Other

## 2020-12-24 ENCOUNTER — Other Ambulatory Visit: Payer: Self-pay

## 2020-12-24 DIAGNOSIS — I471 Supraventricular tachycardia: Secondary | ICD-10-CM | POA: Diagnosis not present

## 2020-12-27 ENCOUNTER — Encounter: Payer: Self-pay | Admitting: Internal Medicine

## 2020-12-27 NOTE — Assessment & Plan Note (Signed)
Crestor.  Follow lipid panel and liver function tests.   

## 2020-12-27 NOTE — Assessment & Plan Note (Signed)
Was followed by psychiatry.  Was receiving invega sustena injections.  Off now.  Desires not to take medication.  Follow.

## 2020-12-27 NOTE — Assessment & Plan Note (Signed)
Previous ablation.  Has been seeing Dr Fletcher Anon.  Today episode of SVT in office.  Responded to valsalva.  Back in SR.  Will check routine labs including metabolic panel and tsh.  Discussed with Dr Fletcher Anon.  Plan to place monitor - two week.  Discussed with Ms Mangieri.  She is agreeable.  Cardiology to contact her to come in for placement.

## 2020-12-27 NOTE — Assessment & Plan Note (Signed)
Was seeing psychiatry.  On no medication now.  Follow.

## 2020-12-27 NOTE — Assessment & Plan Note (Signed)
Previously evaluated by Dr Madalyn Rob.  Breathing doing well. No sob.  No cough or congestion.  Follow.

## 2020-12-29 ENCOUNTER — Other Ambulatory Visit: Payer: Self-pay

## 2020-12-29 ENCOUNTER — Other Ambulatory Visit (INDEPENDENT_AMBULATORY_CARE_PROVIDER_SITE_OTHER): Payer: Medicare Other

## 2020-12-29 ENCOUNTER — Other Ambulatory Visit: Payer: Medicare Other

## 2020-12-29 DIAGNOSIS — E875 Hyperkalemia: Secondary | ICD-10-CM | POA: Diagnosis not present

## 2020-12-29 LAB — POTASSIUM: Potassium: 4.5 mEq/L (ref 3.5–5.1)

## 2020-12-30 DIAGNOSIS — H2513 Age-related nuclear cataract, bilateral: Secondary | ICD-10-CM | POA: Diagnosis not present

## 2021-01-12 DIAGNOSIS — I471 Supraventricular tachycardia: Secondary | ICD-10-CM | POA: Diagnosis not present

## 2021-01-29 ENCOUNTER — Encounter: Payer: Self-pay | Admitting: Cardiovascular Disease

## 2021-01-29 ENCOUNTER — Ambulatory Visit (INDEPENDENT_AMBULATORY_CARE_PROVIDER_SITE_OTHER): Payer: Medicare Other | Admitting: Cardiovascular Disease

## 2021-01-29 ENCOUNTER — Other Ambulatory Visit: Payer: Self-pay

## 2021-01-29 VITALS — BP 100/60 | HR 73 | Ht 65.0 in | Wt 119.0 lb

## 2021-01-29 DIAGNOSIS — I471 Supraventricular tachycardia, unspecified: Secondary | ICD-10-CM

## 2021-01-29 MED ORDER — METOPROLOL TARTRATE 25 MG PO TABS: 25.0000 mg | ORAL_TABLET | Freq: Every day | ORAL | 5 refills | Status: AC | PRN

## 2021-01-29 NOTE — Patient Instructions (Signed)
Medication Instructions:  Your physician has recommended you make the following change in your medication:   START Metoprolol tartrate 25 mg daily as needed for tachycardia.  *If you need a refill on your cardiac medications before your next appointment, please call your pharmacy*   Lab Work: None ordered If you have labs (blood work) drawn today and your tests are completely normal, you will receive your results only by: Marland Kitchen MyChart Message (if you have MyChart) OR . A paper copy in the mail If you have any lab test that is abnormal or we need to change your treatment, we will call you to review the results.   Testing/Procedures: None ordered   Follow-Up: At [facility], you and your health needs are our priority.  As part of our continuing mission to provide you with exceptional heart care, we have created designated Provider Care Teams.  These Care Teams include your primary Cardiologist (physician) and Advanced Practice Providers (APPs -  Physician Assistants and Nurse Practitioners) who all work together to provide you with the care you need, when you need it.  We recommend signing up for the patient portal called "MyChart".  Sign up information is provided on this After Visit Summary.  MyChart is used to connect with patients for Virtual Visits (Telemedicine).  Patients are able to view lab/test results, encounter notes, upcoming appointments, etc.  Non-urgent messages can be sent to your provider as well.   To learn more about what you can do with MyChart, go to [email or web address].    Your next appointment:   Your physician wants you to follow-up in: 1 year You will receive a reminder letter in the mail two months in advance. If you don't receive a letter, please call our office to schedule the follow-up appointment.  The format for your next appointment:   In Person  Provider:   You may see Kathlyn Sacramento, MD or one of the following Advanced Practice Providers on your  designated Care Team:    Murray Hodgkins, NP  Christell Faith, PA-C  Marrianne Mood, PA-C  Cadence [place], [place]  Laurann Montana, NP    Other Instructions N/A

## 2021-01-29 NOTE — Progress Notes (Signed)
Cardiology Office Note   Date:  01/29/2021   ID:  Jody Howard, DOB 08-29-1941, MRN [id number]  PCP:  Jody Pheasant, MD  Cardiologist:   Jody Sacramento, MD   Chief Complaint  Patient presents with  . PHQ-9 4 Week Follow-up    "doing well." Medications reviewed by the patient verbally.       History of Present Illness: Jody Howard is a 80 y.o. female who is here today for follow-up visit regarding paroxysmal supraventricular tachycardia.    She has known history of supraventricular tachycardia status post ablation more than 10 years ago at [facility].  No history of ischemic heart disease or heart failure.  She has been overall healthy throughout her life.  She does have hyperlipidemia and takes rosuvastatin.  Family history is negative for premature coronary artery disease, arrhythmia or sudden death.  There is no history of tobacco use or alcohol use.   She was seen by me in 2019 for paroxysmal supraventricular tachycardia. Echocardiogram showed an EF of 55 to 60% with mild to moderate mitral regurgitation. She was not very symptomatic and elected for no treatment.  She was seen by Dr. Nicki Reaper recently and was noted to be tachycardic. An EKG was done which showed ectopic atrial tachycardia. We did an outpatient 2-week monitor which showed frequent runs of SVT likely ectopic atrial tachycardia. The longest episode lasted 50 minutes. Overall she reports feeling great with no chest pain or shortness of breath. She does not feel significantly bothered by these episodes of tachycardia. Her blood pressure tends to be low. The patient is accompanied by her daughter who is a hospitalist.  Past Medical History:  Diagnosis Date  . Anemia   . Atrial tachycardia (HCC)    s/p ablation  . Clostridium difficile infection 1997   required hospitalization  . Family history of breast cancer   . Hyperlipidemia   . Mitral valve prolapse   . Paroxysmal  supraventricular tachycardia ([place])   . Rheumatic fever     Past Surgical History:  Procedure Laterality Date  . ABLATION     for atrial tachycardia  . BREAST BIOPSY  1997  . TONSILLECTOMY  1944     Current Outpatient Medications  Medication Sig Dispense Refill  . alendronate (FOSAMAX) 70 MG tablet Take 1 tablet (70 mg total) by mouth every 7 (seven) days. Take with a full glass of water on an empty stomach. 4 tablet 6  . CALCIUM PO Take 1 tablet by mouth daily.     . Cholecalciferol (VITAMIN D-3 PO) Take 1 capsule by mouth daily. Taking 2000 IU daily    . Multiple Vitamins-Minerals (MULTIVITAMIN PO) Take 1 tablet by mouth daily.     . rosuvastatin (CRESTOR) 5 MG tablet TAKE 1 TABLET BY MOUTH EVERY DAY 30 tablet 5   No current facility-administered medications for this visit.    Allergies:   Codeine, Lipitor [atorvastatin], Simvastatin, Ciprofloxacin, Other, Silicone, and Flagyl [metronidazole]    Social History:  The patient  reports that she has never smoked. She has never used smokeless tobacco. She reports current alcohol use. She reports that she does not use drugs.   Family History:  The patient's family history includes Breast cancer in her cousin and maternal aunt; Breast cancer (age of onset: 65) in her sister; Breast cancer (age of onset: 83) in her sister; Breast cancer (age of onset: 25) in her mother; Cancer in her paternal aunt; Heart attack in  her sister; Multiple myeloma in her father.    ROS:  Please see the history of present illness.   Otherwise, review of systems are positive for none.   All other systems are reviewed and negative.    PHYSICAL EXAM: VS:  BP 100/60 (BP Location: Left Arm, Patient Position: Sitting, Cuff Size: Normal)   Pulse 73   Ht 5' 5"  (1.651 m)   Wt 119 lb (54 kg)   LMP 01/29/1983   SpO2 98%   BMI 19.80 kg/m  , BMI Body mass index is 19.8 kg/m. GEN: Well nourished, well developed, in no acute distress  HEENT: normal  Neck: no JVD,  carotid bruits, or masses Cardiac: RRR; no murmurs, rubs, or gallops,no edema  Respiratory:  clear to auscultation bilaterally, normal work of breathing GI: soft, nontender, nondistended, + BS MS: no deformity or atrophy  Skin: warm and dry, no rash Neuro:  Strength and sensation are intact Psych: euthymic mood, full affect   EKG:  EKG is not ordered today.    Recent Labs: 12/18/2020: ALT 9; BUN 14; Creatinine, Ser 0.96; Hemoglobin 13.0; Platelets 356.0; Sodium 142; TSH 1.56 12/29/2020: Potassium 4.5    Lipid Panel    Component Value Date/Time   CHOL 182 12/18/2020 0919   TRIG 134.0 12/18/2020 0919   HDL 62.50 12/18/2020 0919   CHOLHDL 3 12/18/2020 0919   VLDL 26.8 12/18/2020 0919   LDLCALC 93 12/18/2020 0919      Wt Readings from Last 3 Encounters:  01/29/21 119 lb (54 kg)  12/18/20 119 lb 6.4 oz (54.2 kg)  10/28/20 118 lb 6.4 oz (53.7 kg)     No flowsheet data found.    ASSESSMENT AND PLAN:  1.  Paroxysmal supraventricular tachycardia: The patient has frequent runs of short SVT. Most of these episodes seem to be due to ectopic atrial tachycardia. Fortunately, the patient is not very symptomatic.  Treating her with a calcium channel blocker or beta-blocker is not feasible given that her blood pressure is in the low side. I do not think the symptoms are significant enough to justify an antiarrhythmic medication. I elected to give her metoprolol tartrate 25 mg to be used as needed.  2.  Hyperlipidemia: Currently on small dose rosuvastatin.    Disposition:   FU with me in 12 months  Signed,  Jody Sacramento, MD  01/29/2021 11:48 AM    [place]

## 2021-02-13 ENCOUNTER — Telehealth: Payer: Self-pay | Admitting: Internal Medicine

## 2021-02-13 DIAGNOSIS — Z1231 Encounter for screening mammogram for malignant neoplasm of breast: Secondary | ICD-10-CM

## 2021-02-13 NOTE — Telephone Encounter (Signed)
Pt daughter called and they need a new order for a Mammogram sent to [facility]  She cannot drive to [place] anymore

## 2021-02-13 NOTE — Telephone Encounter (Signed)
Pt has called multiple times today to figure out where an appt is scheduled for 02/16/21. She kept saying that she has an appt with [facility] at 11:30 on 3/14. I informed her several times that it is not with [place]. Pt daughter called and said that the appt was for [facility] and that her mother was going to cancel that. Pt then called back twice within 20 minutes because she couldn't remember what we had discussed or the phone number to [facility]. I called [facility] to cancel appt on behalf of the daughters request and they informed me that pt had just cancelled appt

## 2021-02-13 NOTE — Telephone Encounter (Signed)
Discussed with Caryl Pina. This message was an fyi. Will schedule mammo

## 2021-02-13 NOTE — Telephone Encounter (Signed)
Pt daughter would like [place] to make appt with [place] and then call her mother with Date and Time

## 2021-02-17 NOTE — Addendum Note (Signed)
Addended by: Lars Masson on: 02/17/2021 11:50 AM   Modules accepted: Orders

## 2021-02-18 NOTE — Telephone Encounter (Signed)
Mammogram scheduled. Unable to leave message on land line. LM on Lori's cell phone with appt date, time and location. [facility]- 03/10/21 10:40 am.

## 2021-03-02 DIAGNOSIS — M4685 Other specified inflammatory spondylopathies, thoracolumbar region: Secondary | ICD-10-CM | POA: Diagnosis not present

## 2021-03-10 ENCOUNTER — Other Ambulatory Visit: Payer: Self-pay

## 2021-03-10 ENCOUNTER — Ambulatory Visit
Admission: RE | Admit: 2021-03-10 | Discharge: 2021-03-10 | Disposition: A | Discharge status: Home / Self Care | Payer: Medicare Other | Source: Ambulatory Visit | Attending: Internal Medicine | Admitting: Internal Medicine

## 2021-03-10 DIAGNOSIS — Z1231 Encounter for screening mammogram for malignant neoplasm of breast: Secondary | ICD-10-CM | POA: Insufficient documentation

## 2021-04-01 ENCOUNTER — Ambulatory Visit: Payer: Medicare Other

## 2021-04-06 ENCOUNTER — Ambulatory Visit (INDEPENDENT_AMBULATORY_CARE_PROVIDER_SITE_OTHER): Payer: Medicare Other

## 2021-04-06 ENCOUNTER — Other Ambulatory Visit: Payer: Self-pay

## 2021-04-06 DIAGNOSIS — Z111 Encounter for screening for respiratory tuberculosis: Secondary | ICD-10-CM

## 2021-04-06 NOTE — Progress Notes (Signed)
Patient presented for PPD injection to left forearm, patient voiced no concerns nor showed any signs of distress during injection. 

## 2021-04-08 ENCOUNTER — Ambulatory Visit (INDEPENDENT_AMBULATORY_CARE_PROVIDER_SITE_OTHER): Payer: Medicare Other

## 2021-04-08 ENCOUNTER — Other Ambulatory Visit: Payer: Self-pay

## 2021-04-08 DIAGNOSIS — Z111 Encounter for screening for respiratory tuberculosis: Secondary | ICD-10-CM

## 2021-04-08 LAB — TB SKIN TEST
Induration: 0 mm
TB Skin Test: NEGATIVE

## 2021-04-08 NOTE — Progress Notes (Signed)
Patient presented for PPD read. Results where Negative/0MM

## 2021-04-20 ENCOUNTER — Other Ambulatory Visit: Payer: Self-pay

## 2021-04-20 ENCOUNTER — Ambulatory Visit (INDEPENDENT_AMBULATORY_CARE_PROVIDER_SITE_OTHER): Payer: Medicare Other | Admitting: Internal Medicine

## 2021-04-20 DIAGNOSIS — E7800 Pure hypercholesterolemia, unspecified: Secondary | ICD-10-CM

## 2021-04-20 DIAGNOSIS — J479 Bronchiectasis, uncomplicated: Secondary | ICD-10-CM

## 2021-04-20 DIAGNOSIS — F439 Reaction to severe stress, unspecified: Secondary | ICD-10-CM | POA: Diagnosis not present

## 2021-04-20 DIAGNOSIS — F250 Schizoaffective disorder, bipolar type: Secondary | ICD-10-CM

## 2021-04-20 DIAGNOSIS — I471 Supraventricular tachycardia: Secondary | ICD-10-CM

## 2021-04-20 DIAGNOSIS — Z0289 Encounter for other administrative examinations: Secondary | ICD-10-CM

## 2021-04-20 NOTE — Progress Notes (Signed)
Patient ID: Oriya Kettering, female   DOB: [birth year], 80 y.o.   MRN: [id number]   Subjective:    Patient ID: [name], female    DOB: [birth year], 80 y.o.   MRN: [id number]  HPI This visit occurred during the SARS-CoV-2 public health emergency.  Safety protocols were in place, including screening questions prior to the visit, additional usage of staff PPE, and extensive cleaning of exam room while observing appropriate contact time as indicated for disinfecting solutions.  Patient here for [facility] form completion. She is accompanied by her son.  History obtained from both of them.  She reports she feels good.  No pain.  Specifically denies any chest pain or sob.  No increased heart rate or palpitations.  No sob.  No cough or congestion.  Eating.  No nausea or vomiting.  No abdominal pain.  Bowels stable.  Weight 114 pounds.  Last 119 pounds.  States has a good appetite.  Blood pressure doing well.  Discussed assisted living.  FL2 form completed.    Past Medical History:  Diagnosis Date  . Anemia   . Atrial tachycardia (HCC)    s/p ablation  . Clostridium difficile infection 1997   required hospitalization  . Family history of breast cancer   . Hyperlipidemia   . Mitral valve prolapse   . Paroxysmal supraventricular tachycardia ([place])   . Rheumatic fever    Past Surgical History:  Procedure Laterality Date  . ABLATION     for atrial tachycardia  . BREAST BIOPSY  1997  . TONSILLECTOMY  1944   Family History  Problem Relation Age of Onset  . Breast cancer Mother 1  . Cancer Paternal Aunt        colon cancer  . Multiple myeloma Father        dx in his 74s  . Breast cancer Sister 49       d. 34  . Heart attack Sister        due to iodine reaction  . Breast cancer Sister 55       d. 15  . Breast cancer Cousin        dx twice, first time possibly under 42; mat first cousin  . Breast cancer Maternal Aunt        dx in her 85s   Social History   Socioeconomic  History  . Marital status: Widowed    Spouse name: Not on file  . Number of children: 2  . Years of education: Not on file  . Highest education level: Not on file  Occupational History  . Not on file  Tobacco Use  . Smoking status: Never Smoker  . Smokeless tobacco: Never Used  Vaping Use  . Vaping Use: Never used  Substance and Sexual Activity  . Alcohol use: Yes    Alcohol/week: 0.0 standard drinks    Comment: occasional  . Drug use: No  . Sexual activity: Not Currently  Other Topics Concern  . Not on file  Social History Narrative  . Not on file   Social Determinants of Health   Financial Resource Strain: Not on file  Food Insecurity: Not on file  Transportation Needs: Not on file  Physical Activity: Not on file  Stress: Not on file  Social Connections: Not on file    Outpatient Encounter Medications as of 04/20/2021  Medication Sig  . alendronate (FOSAMAX) 70 MG tablet Take 1 tablet (70 mg total) by mouth every 7 (seven) days.  Take with a full glass of water on an empty stomach.  Marland Kitchen CALCIUM PO Take 1 tablet by mouth daily.   . Cholecalciferol (VITAMIN D-3 PO) Take 1 capsule by mouth daily. Taking 2000 IU daily  . metoprolol tartrate (LOPRESSOR) 25 MG tablet Take 1 tablet (25 mg total) by mouth daily as needed (for tachycardia).  . Multiple Vitamins-Minerals (MULTIVITAMIN PO) Take 1 tablet by mouth daily.   . rosuvastatin (CRESTOR) 5 MG tablet TAKE 1 TABLET BY MOUTH EVERY DAY   No facility-administered encounter medications on file as of 04/20/2021.    Review of Systems  Constitutional: Negative for appetite change and unexpected weight change.  HENT: Negative for congestion and sinus pressure.   Respiratory: Negative for cough, chest tightness and shortness of breath.   Cardiovascular: Negative for chest pain, palpitations and leg swelling.  Gastrointestinal: Negative for abdominal pain, diarrhea, nausea and vomiting.  Genitourinary: Negative for difficulty  urinating and dysuria.  Musculoskeletal: Negative for joint swelling and myalgias.  Skin: Negative for color change and rash.  Neurological: Negative for dizziness, light-headedness and headaches.  Psychiatric/Behavioral: Negative for agitation and dysphoric mood.       Objective:    Physical Exam Vitals reviewed.  Constitutional:      General: She is not in acute distress.    Appearance: Normal appearance.  HENT:     Head: Normocephalic and atraumatic.     Right Ear: External ear normal.     Left Ear: External ear normal.  Eyes:     General: No scleral icterus.       Right eye: No discharge.        Left eye: No discharge.     Conjunctiva/sclera: Conjunctivae normal.  Neck:     Thyroid: No thyromegaly.  Cardiovascular:     Rate and Rhythm: Normal rate and regular rhythm.  Pulmonary:     Effort: No respiratory distress.     Breath sounds: Normal breath sounds. No wheezing.  Abdominal:     General: Bowel sounds are normal.     Palpations: Abdomen is soft.     Tenderness: There is no abdominal tenderness.  Musculoskeletal:        General: No swelling or tenderness.     Cervical back: Neck supple. No tenderness.  Lymphadenopathy:     Cervical: No cervical adenopathy.  Skin:    Findings: No erythema or rash.  Neurological:     Mental Status: She is alert.  Psychiatric:        Mood and Affect: Mood normal.        Behavior: Behavior normal.     BP 108/70   Pulse (!) 115   Temp 98.1 F (36.7 C)   Resp 16   Ht _0  (1.651 m)   Wt 114 lb 12.8 oz (52.1 kg)   LMP 01/29/1983   SpO2 99%   BMI 19.10 kg/m  Wt Readings from Last 3 Encounters:  04/20/21 114 lb 12.8 oz (52.1 kg)  01/29/21 119 lb (54 kg)  12/18/20 119 lb 6.4 oz (54.2 kg)     Lab Results  Component Value Date   WBC 6.4 12/18/2020   HGB 13.0 12/18/2020   HCT 39.3 12/18/2020   PLT 356.0 12/18/2020   GLUCOSE 92 12/18/2020   CHOL 182 12/18/2020   TRIG 134.0 12/18/2020   HDL 62.50 12/18/2020    LDLCALC 93 12/18/2020   ALT 9 12/18/2020   AST 20 12/18/2020   NA 142 12/18/2020  K 4.5 12/29/2020   CL 104 12/18/2020   CREATININE 0.96 12/18/2020   BUN 14 12/18/2020   CO2 31 12/18/2020   TSH 1.56 12/18/2020   HGBA1C 4.9 07/28/2018    MM 3D SCREEN BREAST BILATERAL  Result Date: 03/11/2021 CLINICAL DATA:  Screening. EXAM: DIGITAL SCREENING BILATERAL MAMMOGRAM WITH TOMOSYNTHESIS AND CAD TECHNIQUE: Bilateral screening digital craniocaudal and mediolateral oblique mammograms were obtained. Bilateral screening digital breast tomosynthesis was performed. The images were evaluated with computer-aided detection. COMPARISON:  Previous exam(s). ACR Breast Density Category b: There are scattered areas of fibroglandular density. FINDINGS: There are no findings suspicious for malignancy. The images were evaluated with computer-aided detection. IMPRESSION: No mammographic evidence of malignancy. A result letter of this screening mammogram will be mailed directly to the patient. RECOMMENDATION: Screening mammogram in one year. (Code:SM-B-01Y) BI-RADS CATEGORY  1: Negative. Electronically Signed   By: Claudie Revering M.D.   On: 03/11/2021 12:52       Assessment & Plan:   Problem List Items Addressed This Visit    Bronchiectasis [facility])    Previously evaluated by Dr Madalyn Rob.  Breathing stable.  No sob.       Encounter for completion of form with patient    FL2 form completed.        Paroxysmal SVT (supraventricular tachycardia) (HCC)    Previous ablation.  Recent episodes of SVT.  Seeing Dr Fletcher Anon.  Has metoprolol if needed.  Does not feel episodes.  In SR today - rate 88.  Feels good.  Follow.       Pure hypercholesterolemia    On crestor.  Follow lipid panel and liver function tests.        [facility]oa[facility]ive disorder [facility])    Was seeing psychiatry.  On no medication now.  Stable.        Stress    Overall appears to be handling things well.  Follow.            Einar Pheasant, MD

## 2021-04-21 ENCOUNTER — Ambulatory Visit: Payer: Medicare Other | Admitting: Internal Medicine

## 2021-04-21 ENCOUNTER — Ambulatory Visit: Payer: Medicare Other

## 2021-04-26 ENCOUNTER — Encounter: Payer: Self-pay | Admitting: Internal Medicine

## 2021-04-26 DIAGNOSIS — Z0289 Encounter for other administrative examinations: Secondary | ICD-10-CM | POA: Insufficient documentation

## 2021-04-26 NOTE — Assessment & Plan Note (Signed)
Was seeing psychiatry.  On no medication now.  Stable.  

## 2021-04-26 NOTE — Assessment & Plan Note (Signed)
FL2 form completed.  

## 2021-04-26 NOTE — Assessment & Plan Note (Signed)
Previous ablation.  Recent episodes of SVT.  Seeing Dr Fletcher Anon.  Has metoprolol if needed.  Does not feel episodes.  In SR today - rate 88.  Feels good.  Follow.

## 2021-04-26 NOTE — Assessment & Plan Note (Signed)
On crestor.  Follow lipid panel and liver function tests.   

## 2021-04-26 NOTE — Assessment & Plan Note (Signed)
Previously evaluated by Dr Madalyn Rob.  Breathing stable.  No sob.

## 2021-04-26 NOTE — Assessment & Plan Note (Signed)
Overall appears to be handling things well.  Follow.  ?

## 2021-04-27 ENCOUNTER — Ambulatory Visit: Payer: Medicare Other | Admitting: Internal Medicine

## 2021-05-06 ENCOUNTER — Telehealth: Payer: Self-pay | Admitting: Internal Medicine

## 2021-05-06 ENCOUNTER — Telehealth: Payer: Self-pay | Admitting: Cardiovascular Disease

## 2021-05-06 NOTE — Telephone Encounter (Signed)
Incoming call received from [place] with [facility].  They are needing an order that  Includes specifics. How often to monitor the patient heart rate (daily or if the patient is symptomatic). Izora Gala is also requesting  HR parameters of when to give the patient the Metoprolol.   Per Dr. Tyrell Antonio Feb 2022 documentation: "Paroxysmal supraventricular tachycardia: The patient has frequent runs of short SVT. Most of these episodes seem to be due to ectopic atrial tachycardia. Fortunately, the patient is not very symptomatic.  Treating her with a calcium channel blocker or beta-blocker is not feasible given that her blood pressure is in the low side. I do not think the symptoms are significant enough to justify an antiarrhythmic medication. I elected to give her metoprolol tartrate 25 mg to be used as needed."  Msg fwd to Dr. Fletcher Anon to advise on frequency of monitoring the patients HR and when to administer metoprolol.   Izora Gala rqst that the info be faxed to [phone] her attn.

## 2021-05-06 NOTE — Telephone Encounter (Signed)
Home called and said since the [facility] is over 30 hours old, they need a verbal review of patient's  medication list and clarification on the PRN. Please call Izora Gala at [phone].

## 2021-05-06 NOTE — Telephone Encounter (Signed)
Meds clarified. Jody Howard is going to call Dr Fletcher Anon for clarification of metoprolol.

## 2021-05-06 NOTE — Telephone Encounter (Signed)
Izora Gala from [facility] calling  Needs a verbal in order to administer metoprolol medication to patient  Please call to discuss

## 2021-05-06 NOTE — Telephone Encounter (Signed)
Returned the call.  lmom on Nancy's voicemail with the verbal order for Metoprolol tartrate 25 mg daily prn for tachycardia.

## 2021-05-07 DIAGNOSIS — G309 Alzheimer's disease, unspecified: Secondary | ICD-10-CM | POA: Diagnosis not present

## 2021-05-07 DIAGNOSIS — R000 Tachycardia, unspecified: Secondary | ICD-10-CM | POA: Diagnosis not present

## 2021-05-07 DIAGNOSIS — M810 Age-related osteoporosis without current pathological fracture: Secondary | ICD-10-CM | POA: Diagnosis not present

## 2021-05-07 DIAGNOSIS — E785 Hyperlipidemia, unspecified: Secondary | ICD-10-CM | POA: Diagnosis not present

## 2021-05-07 DIAGNOSIS — E559 Vitamin D deficiency, unspecified: Secondary | ICD-10-CM | POA: Diagnosis not present

## 2021-05-07 DIAGNOSIS — F0280 Dementia in other diseases classified elsewhere without behavioral disturbance: Secondary | ICD-10-CM | POA: Diagnosis not present

## 2021-05-11 DIAGNOSIS — M810 Age-related osteoporosis without current pathological fracture: Secondary | ICD-10-CM | POA: Diagnosis not present

## 2021-05-12 ENCOUNTER — Telehealth: Payer: Self-pay | Admitting: Internal Medicine

## 2021-05-12 NOTE — Telephone Encounter (Signed)
Dr. Tyrell Antonio response and recommendation faxed to [phone] attn: [facility]. Fax confirmation received.

## 2021-05-12 NOTE — Telephone Encounter (Signed)
Heart rate should only be checked if the patient is symptomatic .  Metoprolol should be given if heart rate is above 120 bpm.

## 2021-05-12 NOTE — Telephone Encounter (Signed)
Jody Howard came into the office to drop off paperwork in regards to PT. It is upfront for PT.

## 2021-05-13 NOTE — Telephone Encounter (Signed)
Jody Howard from [place], [phone] or cell [phone]. She needs the lot number for the ppd and the day it was read. And her dietary form filled out. You can fax to [phone].

## 2021-05-14 NOTE — Telephone Encounter (Signed)
Info has been added that was needed and Dr Derrel Nip has signed for Dr Nicki Reaper since she is out of the office. Paperwork has been faxed to [facility].

## 2021-06-01 DIAGNOSIS — E785 Hyperlipidemia, unspecified: Secondary | ICD-10-CM | POA: Diagnosis not present

## 2021-06-01 DIAGNOSIS — F0280 Dementia in other diseases classified elsewhere without behavioral disturbance: Secondary | ICD-10-CM | POA: Diagnosis not present

## 2021-06-01 DIAGNOSIS — G309 Alzheimer's disease, unspecified: Secondary | ICD-10-CM | POA: Diagnosis not present

## 2021-06-01 DIAGNOSIS — I471 Supraventricular tachycardia: Secondary | ICD-10-CM | POA: Diagnosis not present

## 2021-06-01 DIAGNOSIS — E559 Vitamin D deficiency, unspecified: Secondary | ICD-10-CM | POA: Diagnosis not present

## 2021-06-04 ENCOUNTER — Encounter: Payer: Self-pay | Admitting: Internal Medicine

## 2021-06-15 DIAGNOSIS — I471 Supraventricular tachycardia: Secondary | ICD-10-CM | POA: Diagnosis not present

## 2021-06-15 DIAGNOSIS — F0280 Dementia in other diseases classified elsewhere without behavioral disturbance: Secondary | ICD-10-CM | POA: Diagnosis not present

## 2021-06-15 DIAGNOSIS — E785 Hyperlipidemia, unspecified: Secondary | ICD-10-CM | POA: Diagnosis not present

## 2021-06-15 DIAGNOSIS — G309 Alzheimer's disease, unspecified: Secondary | ICD-10-CM | POA: Diagnosis not present

## 2021-06-20 IMAGING — MG MM DIGITAL SCREENING BILAT W/ TOMO AND CAD
8 series · 9 of 24 positions shown · non-contrast
Comparison: Previous exam(s).

CLINICAL DATA: Screening.

EXAM:
DIGITAL SCREENING BILATERAL MAMMOGRAM WITH TOMOSYNTHESIS AND CAD
TECHNIQUE: Bilateral screening digital craniocaudal and mediolateral oblique
mammograms were obtained. Bilateral screening digital breast
tomosynthesis was performed. The images were evaluated with
computer-aided detection.

[R CC synth-2D]
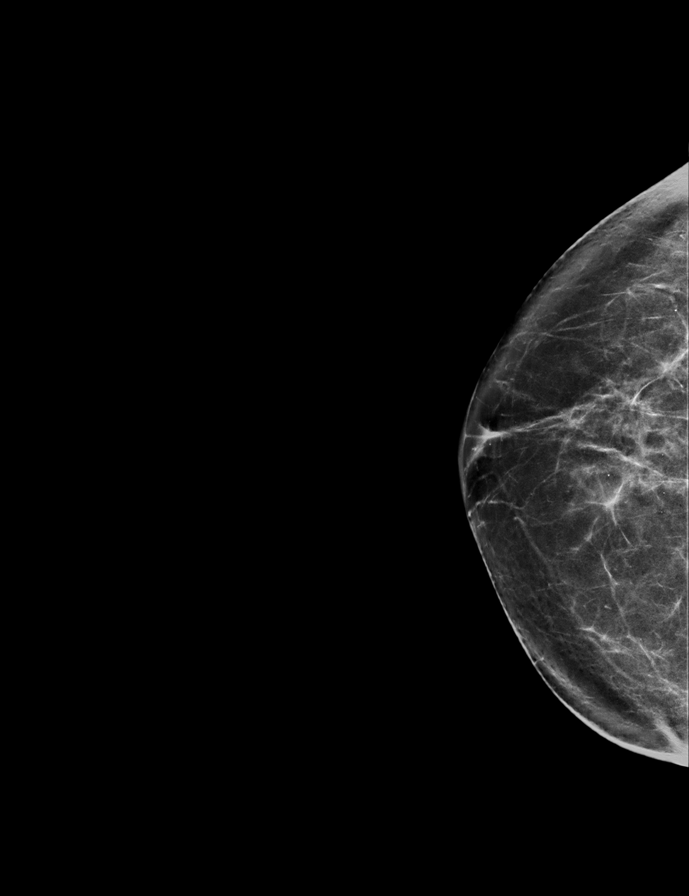

[L MLO synth-2D]
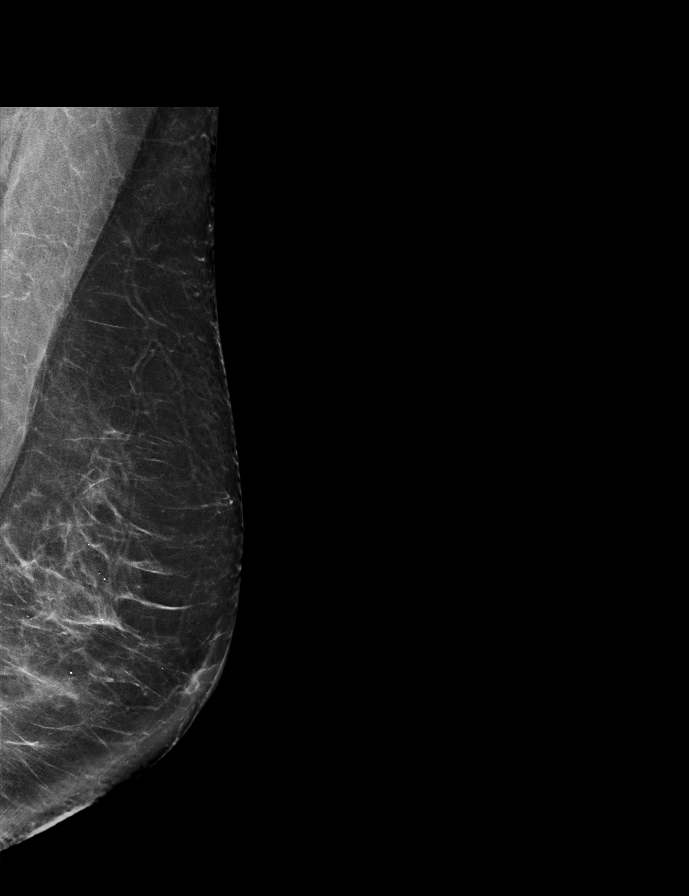

[R MLO synth-2D]
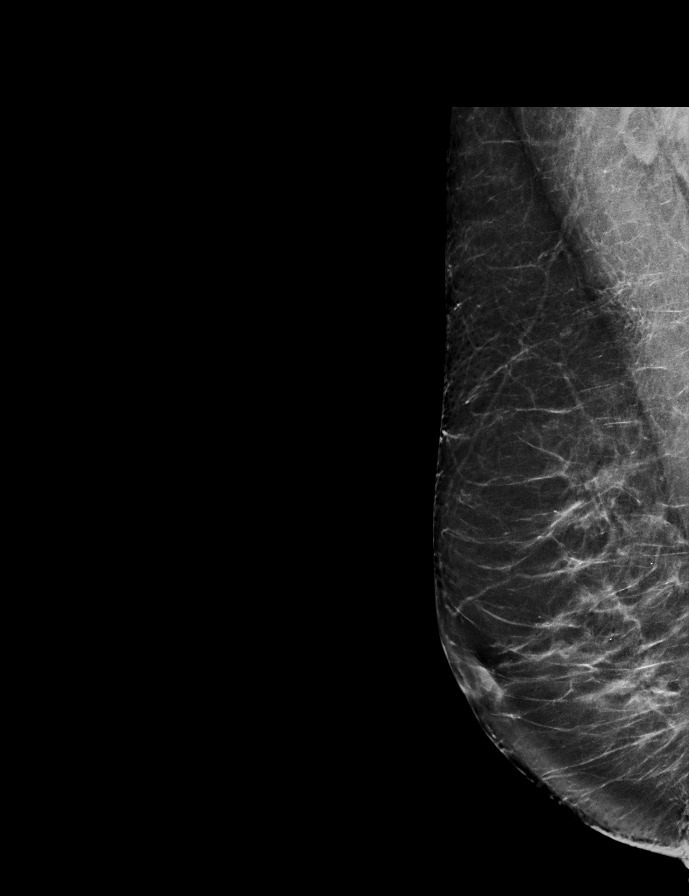

[L CC synth-2D]
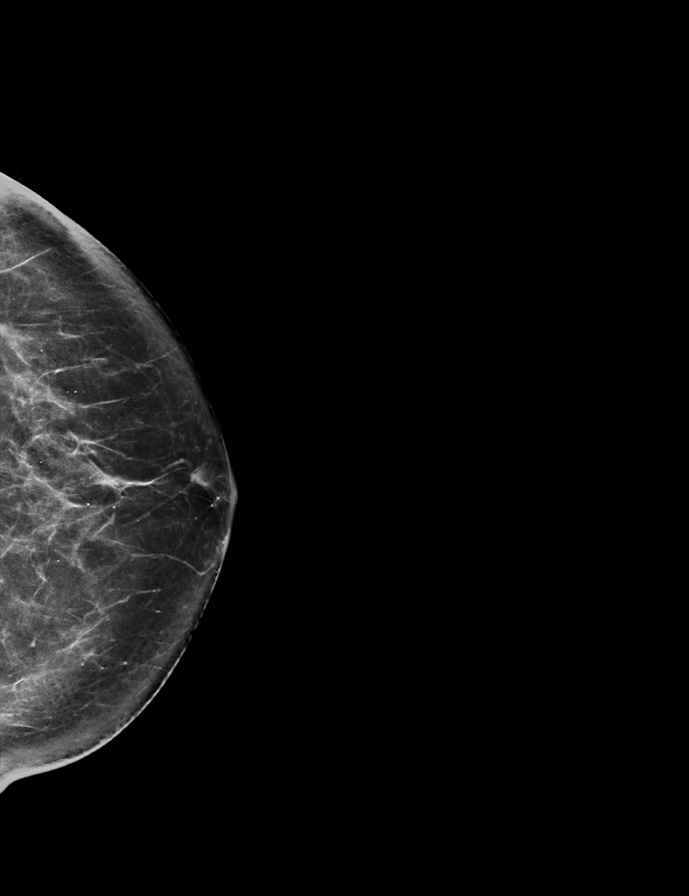

[L CC tomo · 2 of 70 frames shown]
[frame 23/70]
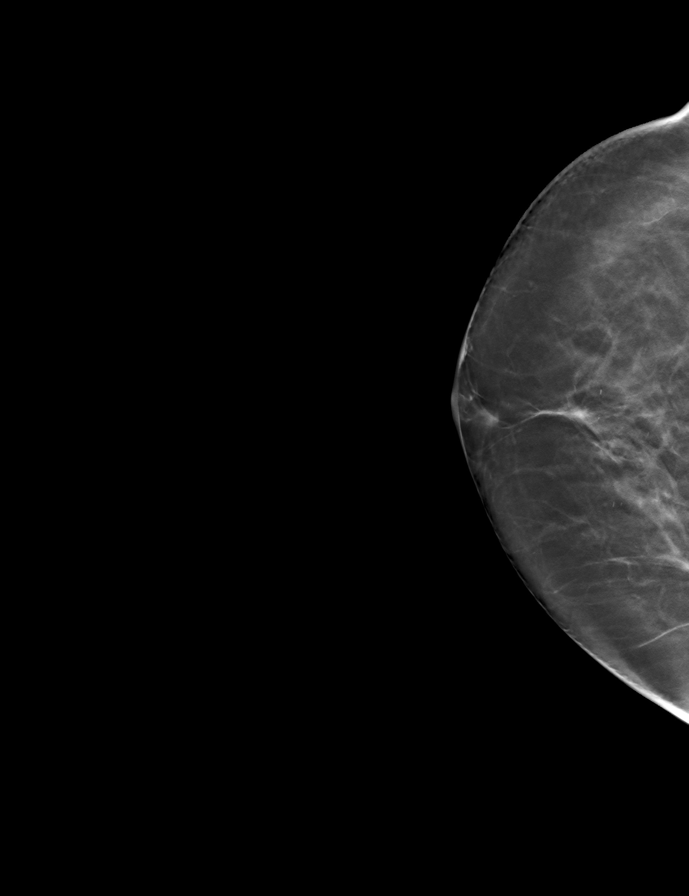
[frame 35/70]
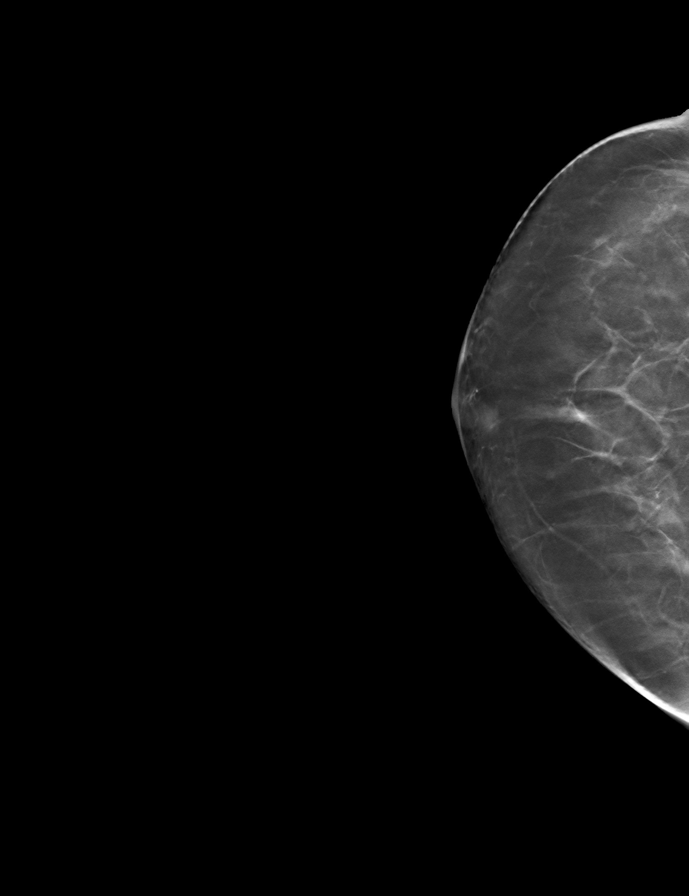

[R CC tomo · tomo slice 35/68.0]
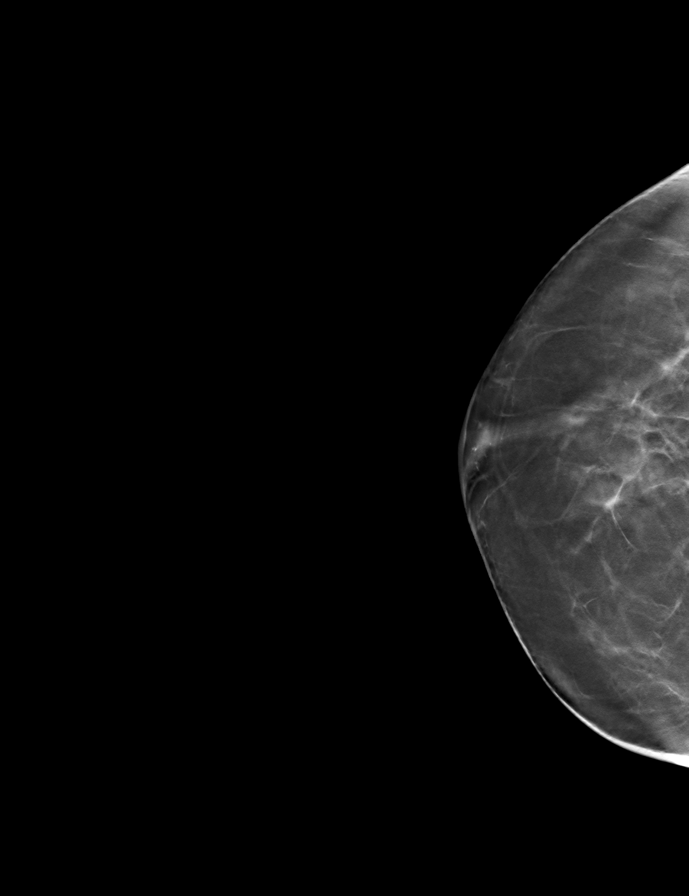

[L MLO tomo · tomo slice 37/73.0]
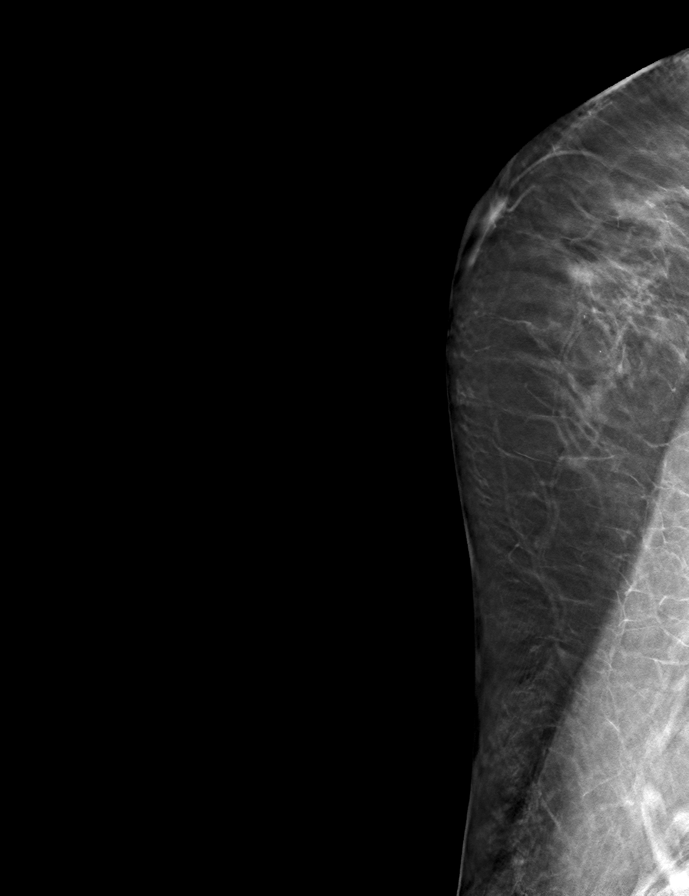

[R MLO tomo · tomo slice 35/70.0]
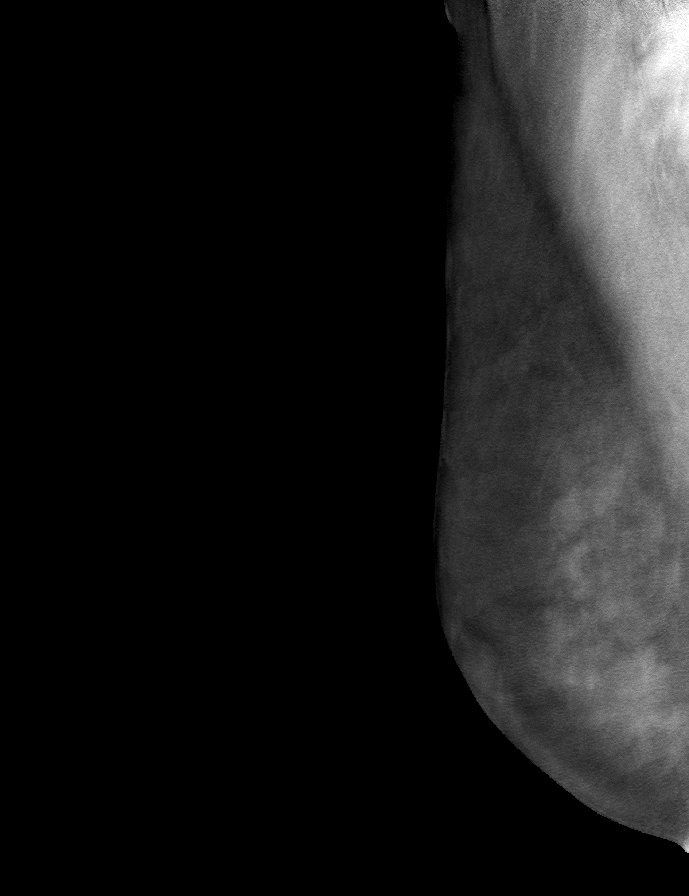

[9 of 24 positions shown; findings below may reference images not displayed]

ACR Breast Density Category b: There are scattered areas of
fibroglandular density.
FINDINGS: There are no findings suspicious for malignancy. The images were
evaluated with computer-aided detection.
IMPRESSION: No mammographic evidence of malignancy. A result letter of this
screening mammogram will be mailed directly to the patient.

RECOMMENDATION:
Screening mammogram in one year. (Code:WJ-[id number])

BI-RADS CATEGORY  1: Negative.

## 2021-06-22 DIAGNOSIS — G309 Alzheimer's disease, unspecified: Secondary | ICD-10-CM | POA: Diagnosis not present

## 2021-06-22 DIAGNOSIS — F0280 Dementia in other diseases classified elsewhere without behavioral disturbance: Secondary | ICD-10-CM | POA: Diagnosis not present

## 2021-06-22 DIAGNOSIS — E785 Hyperlipidemia, unspecified: Secondary | ICD-10-CM | POA: Diagnosis not present

## 2021-06-22 DIAGNOSIS — I471 Supraventricular tachycardia: Secondary | ICD-10-CM | POA: Diagnosis not present

## 2021-07-09 DIAGNOSIS — E559 Vitamin D deficiency, unspecified: Secondary | ICD-10-CM | POA: Diagnosis not present

## 2021-07-09 DIAGNOSIS — G309 Alzheimer's disease, unspecified: Secondary | ICD-10-CM | POA: Diagnosis not present

## 2021-07-09 DIAGNOSIS — E785 Hyperlipidemia, unspecified: Secondary | ICD-10-CM | POA: Diagnosis not present

## 2021-07-09 DIAGNOSIS — F0280 Dementia in other diseases classified elsewhere without behavioral disturbance: Secondary | ICD-10-CM | POA: Diagnosis not present

## 2021-07-09 DIAGNOSIS — I10 Essential (primary) hypertension: Secondary | ICD-10-CM | POA: Diagnosis not present

## 2021-07-27 ENCOUNTER — Ambulatory Visit: Payer: Medicare Other | Admitting: Internal Medicine

## 2021-08-05 DIAGNOSIS — G301 Alzheimer's disease with late onset: Secondary | ICD-10-CM | POA: Diagnosis not present

## 2021-08-05 DIAGNOSIS — F0281 Dementia in other diseases classified elsewhere with behavioral disturbance: Secondary | ICD-10-CM | POA: Diagnosis not present

## 2021-08-13 DIAGNOSIS — G309 Alzheimer's disease, unspecified: Secondary | ICD-10-CM | POA: Diagnosis not present

## 2021-08-13 DIAGNOSIS — F0280 Dementia in other diseases classified elsewhere without behavioral disturbance: Secondary | ICD-10-CM | POA: Diagnosis not present

## 2021-08-13 DIAGNOSIS — E785 Hyperlipidemia, unspecified: Secondary | ICD-10-CM | POA: Diagnosis not present

## 2021-08-13 DIAGNOSIS — Z0000 Encounter for general adult medical examination without abnormal findings: Secondary | ICD-10-CM | POA: Diagnosis not present

## 2021-08-13 DIAGNOSIS — I471 Supraventricular tachycardia: Secondary | ICD-10-CM | POA: Diagnosis not present

## 2021-08-13 DIAGNOSIS — E559 Vitamin D deficiency, unspecified: Secondary | ICD-10-CM | POA: Diagnosis not present

## 2021-08-27 DIAGNOSIS — G301 Alzheimer's disease with late onset: Secondary | ICD-10-CM | POA: Diagnosis not present

## 2021-08-27 DIAGNOSIS — F0281 Dementia in other diseases classified elsewhere with behavioral disturbance: Secondary | ICD-10-CM | POA: Diagnosis not present

## 2021-09-07 DIAGNOSIS — E785 Hyperlipidemia, unspecified: Secondary | ICD-10-CM | POA: Diagnosis not present

## 2021-09-07 DIAGNOSIS — F0280 Dementia in other diseases classified elsewhere without behavioral disturbance: Secondary | ICD-10-CM | POA: Diagnosis not present

## 2021-09-07 DIAGNOSIS — G309 Alzheimer's disease, unspecified: Secondary | ICD-10-CM | POA: Diagnosis not present

## 2021-09-07 DIAGNOSIS — I10 Essential (primary) hypertension: Secondary | ICD-10-CM | POA: Diagnosis not present

## 2021-09-07 DIAGNOSIS — E559 Vitamin D deficiency, unspecified: Secondary | ICD-10-CM | POA: Diagnosis not present

## 2021-09-07 DIAGNOSIS — F339 Major depressive disorder, recurrent, unspecified: Secondary | ICD-10-CM | POA: Diagnosis not present

## 2021-09-11 DIAGNOSIS — Z23 Encounter for immunization: Secondary | ICD-10-CM | POA: Diagnosis not present

## 2021-09-29 DIAGNOSIS — F02811 Dementia in other diseases classified elsewhere, unspecified severity, with agitation: Secondary | ICD-10-CM | POA: Diagnosis not present

## 2021-09-29 DIAGNOSIS — G301 Alzheimer's disease with late onset: Secondary | ICD-10-CM | POA: Diagnosis not present

## 2021-10-08 DIAGNOSIS — E559 Vitamin D deficiency, unspecified: Secondary | ICD-10-CM | POA: Diagnosis not present

## 2021-10-08 DIAGNOSIS — I471 Supraventricular tachycardia: Secondary | ICD-10-CM | POA: Diagnosis not present

## 2021-10-08 DIAGNOSIS — F259 Schizoaffective disorder, unspecified: Secondary | ICD-10-CM | POA: Diagnosis not present

## 2021-10-08 DIAGNOSIS — E785 Hyperlipidemia, unspecified: Secondary | ICD-10-CM | POA: Diagnosis not present

## 2021-10-12 DIAGNOSIS — Z20822 Contact with and (suspected) exposure to covid-19: Secondary | ICD-10-CM | POA: Diagnosis not present

## 2021-10-27 DIAGNOSIS — G301 Alzheimer's disease with late onset: Secondary | ICD-10-CM | POA: Diagnosis not present

## 2021-10-27 DIAGNOSIS — F02B11 Dementia in other diseases classified elsewhere, moderate, with agitation: Secondary | ICD-10-CM | POA: Diagnosis not present

## 2021-11-03 DIAGNOSIS — I471 Supraventricular tachycardia: Secondary | ICD-10-CM | POA: Diagnosis not present

## 2021-11-03 DIAGNOSIS — M810 Age-related osteoporosis without current pathological fracture: Secondary | ICD-10-CM | POA: Diagnosis not present

## 2021-11-03 DIAGNOSIS — E782 Mixed hyperlipidemia: Secondary | ICD-10-CM | POA: Diagnosis not present

## 2021-11-03 DIAGNOSIS — F330 Major depressive disorder, recurrent, mild: Secondary | ICD-10-CM | POA: Diagnosis not present

## 2021-11-03 DIAGNOSIS — E559 Vitamin D deficiency, unspecified: Secondary | ICD-10-CM | POA: Diagnosis not present

## 2021-11-03 DIAGNOSIS — F259 Schizoaffective disorder, unspecified: Secondary | ICD-10-CM | POA: Diagnosis not present

## 2021-11-11 DIAGNOSIS — J101 Influenza due to other identified influenza virus with other respiratory manifestations: Secondary | ICD-10-CM | POA: Diagnosis not present

## 2021-11-11 DIAGNOSIS — Z6821 Body mass index (BMI) 21.0-21.9, adult: Secondary | ICD-10-CM | POA: Diagnosis not present

## 2021-11-11 DIAGNOSIS — R509 Fever, unspecified: Secondary | ICD-10-CM | POA: Diagnosis not present

## 2021-11-11 DIAGNOSIS — R52 Pain, unspecified: Secondary | ICD-10-CM | POA: Diagnosis not present

## 2021-11-25 DIAGNOSIS — F02B2 Dementia in other diseases classified elsewhere, moderate, with psychotic disturbance: Secondary | ICD-10-CM | POA: Diagnosis not present

## 2021-11-25 DIAGNOSIS — G301 Alzheimer's disease with late onset: Secondary | ICD-10-CM | POA: Diagnosis not present

## 2021-11-25 DIAGNOSIS — F330 Major depressive disorder, recurrent, mild: Secondary | ICD-10-CM | POA: Diagnosis not present

## 2021-12-01 DIAGNOSIS — F330 Major depressive disorder, recurrent, mild: Secondary | ICD-10-CM | POA: Diagnosis not present

## 2021-12-01 DIAGNOSIS — I10 Essential (primary) hypertension: Secondary | ICD-10-CM | POA: Diagnosis not present

## 2021-12-01 DIAGNOSIS — E782 Mixed hyperlipidemia: Secondary | ICD-10-CM | POA: Diagnosis not present

## 2021-12-01 DIAGNOSIS — G309 Alzheimer's disease, unspecified: Secondary | ICD-10-CM | POA: Diagnosis not present

## 2021-12-01 DIAGNOSIS — I471 Supraventricular tachycardia: Secondary | ICD-10-CM | POA: Diagnosis not present

## 2021-12-14 DIAGNOSIS — I471 Supraventricular tachycardia: Secondary | ICD-10-CM | POA: Diagnosis not present

## 2021-12-14 DIAGNOSIS — I10 Essential (primary) hypertension: Secondary | ICD-10-CM | POA: Diagnosis not present

## 2021-12-14 DIAGNOSIS — G309 Alzheimer's disease, unspecified: Secondary | ICD-10-CM | POA: Diagnosis not present

## 2021-12-14 DIAGNOSIS — E782 Mixed hyperlipidemia: Secondary | ICD-10-CM | POA: Diagnosis not present

## 2021-12-17 DIAGNOSIS — F22 Delusional disorders: Secondary | ICD-10-CM | POA: Diagnosis not present

## 2021-12-17 DIAGNOSIS — G309 Alzheimer's disease, unspecified: Secondary | ICD-10-CM | POA: Diagnosis not present

## 2021-12-17 DIAGNOSIS — I471 Supraventricular tachycardia: Secondary | ICD-10-CM | POA: Diagnosis not present

## 2021-12-17 DIAGNOSIS — F330 Major depressive disorder, recurrent, mild: Secondary | ICD-10-CM | POA: Diagnosis not present

## 2021-12-17 DIAGNOSIS — F5101 Primary insomnia: Secondary | ICD-10-CM | POA: Diagnosis not present

## 2021-12-25 DIAGNOSIS — G301 Alzheimer's disease with late onset: Secondary | ICD-10-CM | POA: Diagnosis not present

## 2021-12-25 DIAGNOSIS — F02B2 Dementia in other diseases classified elsewhere, moderate, with psychotic disturbance: Secondary | ICD-10-CM | POA: Diagnosis not present

## 2021-12-25 DIAGNOSIS — F330 Major depressive disorder, recurrent, mild: Secondary | ICD-10-CM | POA: Diagnosis not present

## 2021-12-25 DIAGNOSIS — F22 Delusional disorders: Secondary | ICD-10-CM | POA: Diagnosis not present

## 2021-12-29 DIAGNOSIS — E559 Vitamin D deficiency, unspecified: Secondary | ICD-10-CM | POA: Diagnosis not present

## 2021-12-29 DIAGNOSIS — F02B2 Dementia in other diseases classified elsewhere, moderate, with psychotic disturbance: Secondary | ICD-10-CM | POA: Diagnosis not present

## 2021-12-29 DIAGNOSIS — F22 Delusional disorders: Secondary | ICD-10-CM | POA: Diagnosis not present

## 2021-12-29 DIAGNOSIS — F5101 Primary insomnia: Secondary | ICD-10-CM | POA: Diagnosis not present

## 2021-12-29 DIAGNOSIS — I471 Supraventricular tachycardia: Secondary | ICD-10-CM | POA: Diagnosis not present

## 2021-12-29 DIAGNOSIS — G301 Alzheimer's disease with late onset: Secondary | ICD-10-CM | POA: Diagnosis not present

## 2022-01-01 DIAGNOSIS — Z20822 Contact with and (suspected) exposure to covid-19: Secondary | ICD-10-CM | POA: Diagnosis not present

## 2022-01-07 DIAGNOSIS — Z20822 Contact with and (suspected) exposure to covid-19: Secondary | ICD-10-CM | POA: Diagnosis not present

## 2022-01-22 DIAGNOSIS — G301 Alzheimer's disease with late onset: Secondary | ICD-10-CM | POA: Diagnosis not present

## 2022-01-22 DIAGNOSIS — F02B2 Dementia in other diseases classified elsewhere, moderate, with psychotic disturbance: Secondary | ICD-10-CM | POA: Diagnosis not present

## 2022-01-22 DIAGNOSIS — F22 Delusional disorders: Secondary | ICD-10-CM | POA: Diagnosis not present

## 2022-01-22 DIAGNOSIS — F330 Major depressive disorder, recurrent, mild: Secondary | ICD-10-CM | POA: Diagnosis not present

## 2022-01-26 DIAGNOSIS — E785 Hyperlipidemia, unspecified: Secondary | ICD-10-CM | POA: Diagnosis not present

## 2022-01-26 DIAGNOSIS — I10 Essential (primary) hypertension: Secondary | ICD-10-CM | POA: Diagnosis not present

## 2022-02-09 DIAGNOSIS — Z20822 Contact with and (suspected) exposure to covid-19: Secondary | ICD-10-CM | POA: Diagnosis not present

## 2022-02-17 DIAGNOSIS — Z20822 Contact with and (suspected) exposure to covid-19: Secondary | ICD-10-CM | POA: Diagnosis not present

## 2022-03-17 DIAGNOSIS — I10 Essential (primary) hypertension: Secondary | ICD-10-CM | POA: Diagnosis not present

## 2022-03-17 DIAGNOSIS — G301 Alzheimer's disease with late onset: Secondary | ICD-10-CM | POA: Diagnosis not present

## 2022-03-17 DIAGNOSIS — E785 Hyperlipidemia, unspecified: Secondary | ICD-10-CM | POA: Diagnosis not present

## 2022-03-26 DIAGNOSIS — Z20822 Contact with and (suspected) exposure to covid-19: Secondary | ICD-10-CM | POA: Diagnosis not present

## 2022-03-31 DIAGNOSIS — G301 Alzheimer's disease with late onset: Secondary | ICD-10-CM | POA: Diagnosis not present

## 2022-03-31 DIAGNOSIS — E785 Hyperlipidemia, unspecified: Secondary | ICD-10-CM | POA: Diagnosis not present

## 2022-03-31 DIAGNOSIS — I10 Essential (primary) hypertension: Secondary | ICD-10-CM | POA: Diagnosis not present

## 2022-04-01 DIAGNOSIS — Z20822 Contact with and (suspected) exposure to covid-19: Secondary | ICD-10-CM | POA: Diagnosis not present

## 2022-04-05 DIAGNOSIS — Z20822 Contact with and (suspected) exposure to covid-19: Secondary | ICD-10-CM | POA: Diagnosis not present

## 2022-04-06 DIAGNOSIS — Z20822 Contact with and (suspected) exposure to covid-19: Secondary | ICD-10-CM | POA: Diagnosis not present

## 2022-04-09 DIAGNOSIS — Z20822 Contact with and (suspected) exposure to covid-19: Secondary | ICD-10-CM | POA: Diagnosis not present

## 2022-05-26 DIAGNOSIS — E785 Hyperlipidemia, unspecified: Secondary | ICD-10-CM | POA: Diagnosis not present

## 2022-05-26 DIAGNOSIS — I10 Essential (primary) hypertension: Secondary | ICD-10-CM | POA: Diagnosis not present

## 2022-05-26 DIAGNOSIS — E559 Vitamin D deficiency, unspecified: Secondary | ICD-10-CM | POA: Diagnosis not present

## 2022-08-04 DIAGNOSIS — E785 Hyperlipidemia, unspecified: Secondary | ICD-10-CM | POA: Diagnosis not present

## 2022-08-04 DIAGNOSIS — I10 Essential (primary) hypertension: Secondary | ICD-10-CM | POA: Diagnosis not present

## 2022-08-04 DIAGNOSIS — E559 Vitamin D deficiency, unspecified: Secondary | ICD-10-CM | POA: Diagnosis not present

## 2022-08-05 ENCOUNTER — Telehealth: Payer: Self-pay

## 2022-08-05 NOTE — Telephone Encounter (Signed)
Just FYI I called daughter & she stated that the form from [facility] was either a scam or definitely not needed. Pt currently in assisted living & was following actually now or being seen by [employer]. She really was not sure if she would ever see you again & may just be following with them. Daughter stated that she knew they had made some medication changes her patient.

## 2022-08-26 DIAGNOSIS — E785 Hyperlipidemia, unspecified: Secondary | ICD-10-CM | POA: Diagnosis not present

## 2022-08-26 DIAGNOSIS — I10 Essential (primary) hypertension: Secondary | ICD-10-CM | POA: Diagnosis not present

## 2022-08-26 DIAGNOSIS — E559 Vitamin D deficiency, unspecified: Secondary | ICD-10-CM | POA: Diagnosis not present

## 2022-10-05 DIAGNOSIS — Z23 Encounter for immunization: Secondary | ICD-10-CM | POA: Diagnosis not present

## 2022-10-13 DIAGNOSIS — E785 Hyperlipidemia, unspecified: Secondary | ICD-10-CM | POA: Diagnosis not present

## 2022-10-13 DIAGNOSIS — I10 Essential (primary) hypertension: Secondary | ICD-10-CM | POA: Diagnosis not present

## 2022-10-13 DIAGNOSIS — G301 Alzheimer's disease with late onset: Secondary | ICD-10-CM | POA: Diagnosis not present

## 2022-10-20 DIAGNOSIS — E559 Vitamin D deficiency, unspecified: Secondary | ICD-10-CM | POA: Diagnosis not present

## 2022-10-20 DIAGNOSIS — E785 Hyperlipidemia, unspecified: Secondary | ICD-10-CM | POA: Diagnosis not present

## 2022-10-20 DIAGNOSIS — I10 Essential (primary) hypertension: Secondary | ICD-10-CM | POA: Diagnosis not present

## 2022-11-01 DIAGNOSIS — I480 Paroxysmal atrial fibrillation: Secondary | ICD-10-CM | POA: Diagnosis not present

## 2022-11-01 DIAGNOSIS — G301 Alzheimer's disease with late onset: Secondary | ICD-10-CM | POA: Diagnosis not present

## 2022-11-01 DIAGNOSIS — I129 Hypertensive chronic kidney disease with stage 1 through stage 4 chronic kidney disease, or unspecified chronic kidney disease: Secondary | ICD-10-CM | POA: Diagnosis not present

## 2022-11-01 DIAGNOSIS — N1831 Chronic kidney disease, stage 3a: Secondary | ICD-10-CM | POA: Diagnosis not present

## 2022-11-01 DIAGNOSIS — F331 Major depressive disorder, recurrent, moderate: Secondary | ICD-10-CM | POA: Diagnosis not present

## 2022-11-01 DIAGNOSIS — E782 Mixed hyperlipidemia: Secondary | ICD-10-CM | POA: Diagnosis not present

## 2022-12-02 DIAGNOSIS — F02B2 Dementia in other diseases classified elsewhere, moderate, with psychotic disturbance: Secondary | ICD-10-CM | POA: Diagnosis not present

## 2022-12-02 DIAGNOSIS — E785 Hyperlipidemia, unspecified: Secondary | ICD-10-CM | POA: Diagnosis not present

## 2022-12-02 DIAGNOSIS — I10 Essential (primary) hypertension: Secondary | ICD-10-CM | POA: Diagnosis not present

## 2022-12-02 DIAGNOSIS — E559 Vitamin D deficiency, unspecified: Secondary | ICD-10-CM | POA: Diagnosis not present

## 2022-12-02 DIAGNOSIS — M810 Age-related osteoporosis without current pathological fracture: Secondary | ICD-10-CM | POA: Diagnosis not present
